# Patient Record
Sex: Male | Born: 1955
Health system: Southern US, Community
[De-identification: ages and names within clinical notes are randomized; demographics above are authoritative.]

## PROBLEM LIST (undated history)

## (undated) DIAGNOSIS — S066X9A Traumatic subarachnoid hemorrhage with loss of consciousness of unspecified duration, initial encounter: Secondary | ICD-10-CM

## (undated) DIAGNOSIS — R55 Syncope and collapse: Secondary | ICD-10-CM

---

## 1979-06-01 HISTORY — PX: PERCUTANEOUS PINNING PHALANX FRACTURE OF HAND: SUR1027

## 2002-01-23 ENCOUNTER — Emergency Department (HOSPITAL_COMMUNITY): Admission: EM | Admit: 2002-01-23 | Discharge: 2002-01-23 | Payer: Self-pay | Admitting: *Deleted

## 2002-01-23 ENCOUNTER — Encounter: Payer: Self-pay | Admitting: *Deleted

## 2002-01-29 ENCOUNTER — Encounter: Admission: RE | Admit: 2002-01-29 | Discharge: 2002-01-29 | Payer: Self-pay | Admitting: Internal Medicine

## 2002-02-02 ENCOUNTER — Encounter: Admission: RE | Admit: 2002-02-02 | Discharge: 2002-02-02 | Payer: Self-pay | Admitting: Internal Medicine

## 2003-10-21 ENCOUNTER — Emergency Department (HOSPITAL_COMMUNITY): Admission: EM | Admit: 2003-10-21 | Discharge: 2003-10-21 | Payer: Self-pay | Admitting: Emergency Medicine

## 2004-11-29 ENCOUNTER — Emergency Department (HOSPITAL_COMMUNITY): Admission: EM | Admit: 2004-11-29 | Discharge: 2004-11-29 | Payer: Self-pay | Admitting: Family Medicine

## 2012-09-18 ENCOUNTER — Observation Stay (HOSPITAL_COMMUNITY)
Admission: EM | Admit: 2012-09-18 | Discharge: 2012-09-20 | Disposition: A | Discharge status: Home / Self Care | Payer: Self-pay | Attending: Internal Medicine | Admitting: Internal Medicine

## 2012-09-18 ENCOUNTER — Observation Stay (HOSPITAL_COMMUNITY): Payer: Self-pay

## 2012-09-18 ENCOUNTER — Encounter (HOSPITAL_COMMUNITY): Payer: Self-pay | Admitting: Emergency Medicine

## 2012-09-18 ENCOUNTER — Emergency Department (HOSPITAL_COMMUNITY): Payer: Self-pay

## 2012-09-18 DIAGNOSIS — S066X9A Traumatic subarachnoid hemorrhage with loss of consciousness of unspecified duration, initial encounter: Secondary | ICD-10-CM

## 2012-09-18 DIAGNOSIS — I609 Nontraumatic subarachnoid hemorrhage, unspecified: Secondary | ICD-10-CM | POA: Diagnosis present

## 2012-09-18 DIAGNOSIS — R112 Nausea with vomiting, unspecified: Secondary | ICD-10-CM | POA: Insufficient documentation

## 2012-09-18 DIAGNOSIS — Z8679 Personal history of other diseases of the circulatory system: Secondary | ICD-10-CM | POA: Diagnosis present

## 2012-09-18 DIAGNOSIS — R55 Syncope and collapse: Principal | ICD-10-CM | POA: Diagnosis present

## 2012-09-18 DIAGNOSIS — Z720 Tobacco use: Secondary | ICD-10-CM | POA: Diagnosis present

## 2012-09-18 DIAGNOSIS — IMO0002 Reserved for concepts with insufficient information to code with codable children: Secondary | ICD-10-CM | POA: Insufficient documentation

## 2012-09-18 DIAGNOSIS — F17200 Nicotine dependence, unspecified, uncomplicated: Secondary | ICD-10-CM | POA: Diagnosis present

## 2012-09-18 DIAGNOSIS — E860 Dehydration: Secondary | ICD-10-CM | POA: Insufficient documentation

## 2012-09-18 DIAGNOSIS — M25511 Pain in right shoulder: Secondary | ICD-10-CM

## 2012-09-18 DIAGNOSIS — W19XXXA Unspecified fall, initial encounter: Secondary | ICD-10-CM | POA: Insufficient documentation

## 2012-09-18 DIAGNOSIS — R197 Diarrhea, unspecified: Secondary | ICD-10-CM | POA: Insufficient documentation

## 2012-09-18 DIAGNOSIS — M25519 Pain in unspecified shoulder: Secondary | ICD-10-CM | POA: Insufficient documentation

## 2012-09-18 HISTORY — DX: Traumatic subarachnoid hemorrhage with loss of consciousness of unspecified duration, initial encounter: S06.6X9A

## 2012-09-18 HISTORY — DX: Syncope and collapse: R55

## 2012-09-18 LAB — POCT I-STAT, CHEM 8
BUN: 7 mg/dL (ref 6–23)
Calcium, Ion: 1.09 mmol/L — ABNORMAL LOW (ref 1.12–1.23)
HCT: 43 % (ref 39.0–52.0)
TCO2: 29 mmol/L (ref 0–100)

## 2012-09-18 LAB — CBC WITH DIFFERENTIAL/PLATELET
Basophils Absolute: 0 10*3/uL (ref 0.0–0.1)
Basophils Relative: 1 % (ref 0–1)
Eosinophils Relative: 0 % (ref 0–5)
Lymphocytes Relative: 18 % (ref 12–46)
MCV: 89.2 fL (ref 78.0–100.0)
Platelets: 181 10*3/uL (ref 150–400)
RDW: 13.7 % (ref 11.5–15.5)
WBC: 4.4 10*3/uL (ref 4.0–10.5)

## 2012-09-18 LAB — TROPONIN I: Troponin I: <0.3 ng/mL (ref <0.30)

## 2012-09-18 MED ORDER — SODIUM CHLORIDE 0.9 % IV SOLN: INTRAVENOUS | Status: AC

## 2012-09-18 MED ORDER — ACETAMINOPHEN 325 MG PO TABS
650.0000 mg | ORAL_TABLET | Freq: Four times a day (QID) | ORAL | Status: DC | PRN
  Administered 2012-09-18 – 2012-09-19 (×4): 650 mg via ORAL
  Filled 2012-09-18 (×3): qty 2
  Filled 2012-09-18: qty 1

## 2012-09-18 MED ORDER — ONDANSETRON HCL 4 MG/2ML IJ SOLN: 4.0000 mg | Freq: Four times a day (QID) | INTRAMUSCULAR | Status: DC | PRN

## 2012-09-18 MED ORDER — SODIUM CHLORIDE 0.9 % IV SOLN
INTRAVENOUS | Status: DC
  Administered 2012-09-18: 16:00:00 via INTRAVENOUS
  Administered 2012-09-19: 1000 mL via INTRAVENOUS
  Administered 2012-09-20: 05:00:00 via INTRAVENOUS

## 2012-09-18 MED ORDER — SODIUM CHLORIDE 0.9 % IV BOLUS (SEPSIS)
1000.0000 mL | Freq: Once | INTRAVENOUS | Status: AC
  Administered 2012-09-18: 1000 mL via INTRAVENOUS

## 2012-09-18 MED ORDER — SODIUM CHLORIDE 0.9 % IV SOLN
INTRAVENOUS | Status: DC
  Administered 2012-09-18: 14:00:00 via INTRAVENOUS

## 2012-09-18 MED ORDER — ONDANSETRON HCL 4 MG PO TABS: 4.0000 mg | ORAL_TABLET | Freq: Four times a day (QID) | ORAL | Status: DC | PRN

## 2012-09-18 MED ORDER — SODIUM CHLORIDE 0.9 % IV SOLN
INTRAVENOUS | Status: DC
  Administered 2012-09-18: 21:00:00 via INTRAVENOUS

## 2012-09-18 MED ORDER — ACETAMINOPHEN 650 MG RE SUPP: 650.0000 mg | Freq: Four times a day (QID) | RECTAL | Status: DC | PRN

## 2012-09-18 MED ORDER — SODIUM CHLORIDE 0.9 % IJ SOLN
3.0000 mL | Freq: Two times a day (BID) | INTRAMUSCULAR | Status: DC
  Administered 2012-09-18: 3 mL via INTRAVENOUS

## 2012-09-18 MED ORDER — ONDANSETRON HCL 4 MG/2ML IJ SOLN: 4.0000 mg | Freq: Three times a day (TID) | INTRAMUSCULAR | Status: AC | PRN

## 2012-09-18 MED ORDER — IOHEXOL 350 MG/ML SOLN
50.0000 mL | Freq: Once | INTRAVENOUS | Status: AC | PRN
  Administered 2012-09-18: 50 mL via INTRAVENOUS

## 2012-09-18 NOTE — ED Notes (Signed)
Pt reports the past couple of days he has been having chills and body aches, but reports he is feeling better today. Pt reports he is unsure what happened this morning, he was standing by his truck then next thing he knew, he was in an ambulance. Pt c/o neck pain and posterior headahce, denies back pain.

## 2012-09-18 NOTE — ED Notes (Signed)
Neurology at bedside to assess patient

## 2012-09-18 NOTE — H&P (Signed)
[facility] History and Physical  Erik Ward RUE:[id number] DOB: 02-Apr-1956 DOA: 09/18/2012  Referring physician: cook PCP: Pcp Not In System  Specialists:   Chief Complaint: syncope  HPI: Erik Ward is a 56 y.o. male with no medical history (but has not seem provider in decades) who presented to Ed 09/18/12 with cc syncope. Information obtained from patient and sister who is at bedside. Pt reports feeling "woozy" for last 6 days. He reports nausea with 1 episode of vomiting 5 days ago and 2 episodes of "explosive" diarrhea 3 days ago. He reports decreased po intake over last 6 days as he has had intermittent nausea during this time. He also endorses subjective fever, chills and dry cough.  He states he was getting out of car this am and as he was closing car door the "woozines" worsened. He denies HA/blurred vision/numbness/tingling. He denies CP palpitation, sob. He does not remember feeling as though he was going to pass out. Sister reports he "fell out right next to car" and struck his head.  EMS arrived and pt remembers being in ambulance. Symptoms came on suddenly, are resolved, characterized as moderate to severe.  In ED he complained of HA in back of head and CT yielded mild amount of subarachnoid hemorrhage over the convexity on the left. Small subdural hygroma left frontal area. This is most likely related to the right parietal head injury.  In addition questionable hyper density between the cerebral hemispheres anteriorly which could be blood or less likely an aneurysm. Follow up head CT is suggested. Pt seen by neurosurgery. [facility] to eval syncope.      Review of Systems: The patient denies weight loss,, vision loss, decreased hearing, hoarseness, chest pain,  peripheral edema, balance deficits, hemoptysis, abdominal pain, melena, hematochezia, severe indigestion/heartburn, hematuria, incontinence, genital sores, muscle weakness, suspicious skin lesions, transient  blindness, difficulty walking, depression, unusual weight change, abnormal bleeding, enlarged lymph nodes, angioedema, and breast masses.    History reviewed. No pertinent past medical history. Past Surgical History  Procedure Date  . Hand surgery    Social History:  reports that he has been smoking Cigarettes.  He has been smoking about .5 packs per day. He has never used smokeless tobacco. He reports that he does not drink alcohol or use illicit drugs. Lives at home and is independent with ADL's   No Known Allergies  History reviewed. No pertinent family history. Mother alive and 38 yo with DM. Father deceased at 30 prostate cancer. 3 siblings medical hx neg for ca, htn, Dm stroke CAD  Prior to Admission medications   Medication Sig Start Date End Date Taking? Authorizing Provider  aspirin-sod bicarb-citric acid (ALKA-SELTZER) 325 MG TBEF Take 325 mg by mouth every 6 (six) hours as needed. For sinus congestion.   Yes Historical Provider, MD  Pseudoephedrine HCl (SINUS DECONGESTANT PO) Take 2 capsules by mouth daily as needed. For head and nasal congestion.   Yes Historical Provider, MD   Physical Exam: Filed Vitals:   09/18/12 1254 09/18/12 1256 09/18/12 1300 09/18/12 1330  BP: 116/73 104/70 114/73 109/67  Pulse: 82 83 84 78  Temp:      TempSrc:      Resp:   22 22  SpO2:   96% 90%     General:  Awake alert well nourished NAD  Eyes: PERRL EOMI no scleral icterus  ENT: ears clear, no nasal discharge, mucus membrane mouth pink/moist  Neck: supple no jvd no lympadenopathy  Cardiovascular: RRR No  MGR No LEE PPP  Respiratory: normal effort BS slightly distant but clear bilaterally. No wheeze/rhonchi  Abdomen: flat soft +BS non-tender to palpation.   Skin: warm/dry no lesions no rashes   Musculoskeletal: MOE no joint swelling erythema. Right shoulder somewhat tender to palpation. Full rom  Psychiatric: appropriate cooperative  Neurologic: oriented x3. Speech clear  facial symmetry. Cranial nerve II-XII intact.   Labs on Admission:  Basic Metabolic Panel:  Lab 09/18/12 1610  NA 138  K 4.5  CL 100  CO2 --  GLUCOSE 121*  BUN 7  CREATININE 1.10  CALCIUM --  MG --  PHOS --   Liver Function Tests: No results found for this basename: AST:5,ALT:5,ALKPHOS:5,BILITOT:5,PROT:5,ALBUMIN:5 in the last 168 hours No results found for this basename: LIPASE:5,AMYLASE:5 in the last 168 hours No results found for this basename: AMMONIA:5 in the last 168 hours CBC:  Lab 09/18/12 1309 09/18/12 1223  WBC 4.4 --  NEUTROABS 2.6 --  HGB 14.6 14.6  HCT 43.0 43.0  MCV 89.2 --  PLT 181 --   Cardiac Enzymes: No results found for this basename: CKTOTAL:5,CKMB:5,CKMBINDEX:5,TROPONINI:5 in the last 168 hours  BNP (last 3 results) No results found for this basename: PROBNP:3 in the last 8760 hours CBG: No results found for this basename: GLUCAP:5 in the last 168 hours  Radiological Exams on Admission: Ct Head Wo Contrast  09/18/2012  *RADIOLOGY REPORT*  Clinical Data: Fall.  Head injury  CT HEAD WITHOUT CONTRAST  Technique:  Contiguous axial images were obtained from the base of the skull through the vertex without contrast.  Comparison: None.  Findings: Several small areas of hyperintensity over the convexity on the left, compatible with mild subarachnoid hemorrhage.  Small subdural hygroma left frontal area may be due to a contrecoup injury.  No high density subdural blood.  There is a right parietal scalp hematoma which is small.  Negative for skull fracture.  Slight hyperintensity is present in the interhemispheric fissure between the frontal lobes anteriorly.  This is near the anterior communicating artery and may be a small amount of blood or could be the anterior communicating artery.  Subarachnoid hemorrhage and aneurysm are considered less likely.  Follow-up CT is suggested.  Ventricle size is normal.  No acute infarct or mass.  There is mild chronic  microvascular ischemia in the white matter.  IMPRESSION: Mild amount of subarachnoid hemorrhage over the convexity on the left.  Small subdural hygroma left frontal area.  This is most likely related to the right parietal head injury.  Questionable hyper density between the cerebral hemispheres anteriorly which could be blood or less likely an aneurysm.  Follow up head CT is suggested.   Original Report Authenticated By: Janeece Riggers, M.D.     EKG: Independently reviewed. NSR  Assessment/Plan Principal Problem:  *Syncope: likely related to decreased po intake of last 6 days in setting of nausea/vomiting/diarrhea over last 3-4 days. Will admit to tele monitor for arrythmia. Will check troponin level x3, will get 2decho as well. Will check Hg A1c and TSH. Repeat EKG in am.   Active Problems:  Subarachnoid hemorrhage: appreciate neurosurgery assistance. Note indicates will be obtaining ct angio to rule out aneurysm. If negative can get follow up CT in am and likely discharge in am.    Orthostasis: mild from sitting to standing.  Likely related to volume depletion from decrease po and diarrhea. Will continue IV fluids. Recheck in am.  Nausea vomiting and diarrhea: no vomiting for 5 days but  remains nauseated. Will start on full liquids and advance as tolerated. Afebrile WC WNL. Non-toxic appearing. Will provide zofran as needed. Will check influenza panel.   Shoulder pain, right: from fall this am. Full rom. Tylenol ice. If no improvement consider xray.   Tobacco abuse: offered cessation counseling.   Dr cram neurosurgery  Code Status: full Family Communication: sister at bedside Disposition Plan: home hopefully in am if neuro clears  Time spent: 55 minutes  [facility] [place] [facility]   If 7PM-7AM, please contact night-coverage [email or web address] Password [facility] 09/18/2012, 2:31 PM

## 2012-09-18 NOTE — ED Notes (Signed)
Attempted to give report to 4N. RN unavailable at this time.

## 2012-09-18 NOTE — H&P (Signed)
Patient seen and examined. Agree with note by Toya Smothers, NP. Patient has been feeling sick with flu-like symptoms including n/v. Today was getting out of the car and became extremely dizzy and lightheaded. Doesn't remember subsequent events, but witnesses report he fell backwards and hit the back of his head. In the ED he was found to have on CT a small subarachnoid hemorrhage. Neurosurgery was contacted and is recommending a repeat CT in am. We have been asked to admit him for syncopal work up. Since he has no focal neurologic findings, do not think MRI is indicated at this point. ECHO/dopplers, monitor on telemetry, ACS rule out. If syncope work up is negative and his repeat CT shows no enlargement of the [facility], he can probably be discharged home in 1-2 days.  Peggye Pitt, MD [facility] Pager: [phone]

## 2012-09-18 NOTE — ED Provider Notes (Signed)
History     CSN: [id number]  Arrival date & time 09/18/12  1110   First MD Initiated Contact with Patient 09/18/12 1116      Chief Complaint  Patient presents with  . Loss of Consciousness    (Consider location/radiation/quality/duration/timing/severity/associated sxs/prior treatment) HPI Comments: Patient presents with complaint of syncopal episode. Patient has had flulike symptoms for the past several days. He has had chills, cough, muscle aches. He reports decreased appetite and he has not been eating and drinking well. Today he was eating and standing along the side of a truck when he felt dizzy and passed out. Bystanders called EMS. Patient states that he struck the back of his head. He denies blurry vision, slurred speech, weakness in his arms or his legs. EMS placed in c-collar. No chest pain, palpitations or history of heart problems. Onset acute. Course is resolved. Nothing makes symptoms better or worse.  The history is provided by the patient.    History reviewed. No pertinent past medical history.  Past Surgical History  Procedure Date  . Hand surgery     History reviewed. No pertinent family history.  History  Substance Use Topics  . Smoking status: Current Every Day Smoker -- 0.5 packs/day    Types: Cigarettes  . Smokeless tobacco: Never Used  . Alcohol Use: No      Review of Systems  Constitutional: Positive for chills and appetite change. Negative for fatigue.  HENT: Negative for sore throat, neck pain and tinnitus.   Eyes: Negative for photophobia, pain and visual disturbance.  Respiratory: Positive for cough. Negative for shortness of breath.   Cardiovascular: Negative for chest pain, palpitations and leg swelling.  Gastrointestinal: Negative for nausea and vomiting.  Musculoskeletal: Positive for myalgias. Negative for back pain and gait problem.  Skin: Positive for wound.  Neurological: Positive for syncope and headaches. Negative for dizziness,  weakness, light-headedness and numbness.  Psychiatric/Behavioral: Negative for confusion and decreased concentration.    Allergies  Review of patient's allergies indicates no known allergies.  Home Medications   Current Outpatient Rx  Name  Route  Sig  Dispense  Refill  . ASPIRIN EFFERVESCENT 325 MG PO TBEF   Oral   Take 325 mg by mouth every 6 (six) hours as needed. For sinus congestion.         Marland Kitchen SINUS DECONGESTANT PO   Oral   Take 2 capsules by mouth daily as needed. For head and nasal congestion.           BP 113/78  Temp 98.4 F (36.9 C) (Oral)  Resp 17  SpO2 96%  Physical Exam  Nursing note and vitals reviewed. Constitutional: He is oriented to person, place, and time. He appears well-developed and well-nourished.  HENT:  Head: Normocephalic. Head is without raccoon's eyes and without Battle's sign.  Right Ear: Tympanic membrane, external ear and ear canal normal. No hemotympanum.  Left Ear: Tympanic membrane, external ear and ear canal normal. No hemotympanum.  Nose: Nose normal. No nasal septal hematoma.  Mouth/Throat: Oropharynx is clear and moist. Mucous membranes are dry.       Small abrasion to right occiput, no laceration.  Eyes: Conjunctivae normal, EOM and lids are normal. Pupils are equal, round, and reactive to light.       No visible hyphema  Neck: Normal range of motion. Neck supple.  Cardiovascular: Normal rate and regular rhythm.   Pulmonary/Chest: Effort normal and breath sounds normal.  Abdominal: Soft. There is no tenderness.  Musculoskeletal: Normal range of motion.       Cervical back: He exhibits normal range of motion, no tenderness and no bony tenderness.       Thoracic back: He exhibits no tenderness and no bony tenderness.       Lumbar back: He exhibits no tenderness and no bony tenderness.  Neurological: He is alert and oriented to person, place, and time. He has normal strength and normal reflexes. No cranial nerve deficit or sensory  deficit. Coordination normal. GCS eye subscore is 4. GCS verbal subscore is 5. GCS motor subscore is 6.  Skin: Skin is warm and dry.  Psychiatric: He has a normal mood and affect.    ED Course  Procedures (including critical care time)  Labs Reviewed  POCT I-STAT, CHEM 8 - Abnormal; Notable for the following:    Glucose, Bld 121 (*)     Calcium, Ion 1.09 (*)     All other components within normal limits  CBC WITH DIFFERENTIAL - Abnormal; Notable for the following:    Monocytes Relative 22 (*)     All other components within normal limits   Ct Head Wo Contrast  09/18/2012  *RADIOLOGY REPORT*  Clinical Data: Fall.  Head injury  CT HEAD WITHOUT CONTRAST  Technique:  Contiguous axial images were obtained from the base of the skull through the vertex without contrast.  Comparison: None.  Findings: Several small areas of hyperintensity over the convexity on the left, compatible with mild subarachnoid hemorrhage.  Small subdural hygroma left frontal area may be due to a contrecoup injury.  No high density subdural blood.  There is a right parietal scalp hematoma which is small.  Negative for skull fracture.  Slight hyperintensity is present in the interhemispheric fissure between the frontal lobes anteriorly.  This is near the anterior communicating artery and may be a small amount of blood or could be the anterior communicating artery.  Subarachnoid hemorrhage and aneurysm are considered less likely.  Follow-up CT is suggested.  Ventricle size is normal.  No acute infarct or mass.  There is mild chronic microvascular ischemia in the white matter.  IMPRESSION: Mild amount of subarachnoid hemorrhage over the convexity on the left.  Small subdural hygroma left frontal area.  This is most likely related to the right parietal head injury.  Questionable hyper density between the cerebral hemispheres anteriorly which could be blood or less likely an aneurysm.  Follow up head CT is suggested.   Original Report  Authenticated By: Janeece Riggers, M.D.      1. Subarachnoid hemorrhage   2. Syncope   3. Dehydration     11:35 AM Patient seen and examined. Work-up initiated. Medications ordered. Neck cleared by NEXUS. Patient with full ROM in neck without pain.   Vital signs reviewed and are as follows: Filed Vitals:   09/18/12 1118  BP: 113/78  Temp: 98.4 F (36.9 C)  Resp: 17   11:44 AM Dr. Adriana Simas has seen. Head CT ordered.    Date: 09/18/2012  Rate: 80  Rhythm: normal sinus rhythm  QRS Axis: left  Intervals: normal  ST/T Wave abnormalities: normal  Conduction Disutrbances:none  Narrative Interpretation:   Old EKG Reviewed: none available  1:38 PM Spoke with Dr. Lucila Maine. Advises repeat head CT in morning. If not worsening, can be discharged. Will admit to medicine.   Spoke with Dr. Ardyth Harps who will admit.    MDM  Admit for monitoring of subarachnoid hemorrhages. Suspect fall 2/2 dehydration after recent  ILI.         Renne Crigler, PA 09/18/12 1529

## 2012-09-18 NOTE — ED Notes (Signed)
Per EMS - pt had syncopal episode today while he was outside walking. Pt reports he hit his head at first denied neck and back pain then started to have some neck pain. EMS placed C-collar on pt and started an 18G IV. Pt reports the past few days he has been having flu-like symptoms. BP 122/67 HR 82  RR 16

## 2012-09-18 NOTE — Consult Note (Signed)
Reason for Consult: Closed head injury subarachnoid hemorrhage Referring Physician: Emergency department  Erik Ward is an 56 y.o. male.  HPI: Patient is a 56 year old 7 who had a syncopal episode earlier today striking the back of his head on the street today and is brought to the emergency room currently is complaining of a headache in the back of his head he denies any vision changes numbness tingling nausea vomiting history does report that he had when he is described as flulike symptoms in the last couple days with some dizziness headaches and general malaise. CT scan the ER revealed some what appears to be traumatic subarachnoid hemorrhage and we have been consult.  History reviewed. No pertinent past medical history.  Past Surgical History  Procedure Date  . Hand surgery     History reviewed. No pertinent family history.  Social History:  reports that he has been smoking Cigarettes.  He has been smoking about .5 packs per day. He has never used smokeless tobacco. He reports that he does not drink alcohol or use illicit drugs.  Allergies: No Known Allergies  Medications:   Results for orders placed during the hospital encounter of 09/18/12 (from the past 48 hour(s))  POCT I-STAT, CHEM 8     Status: Abnormal   Collection Time   09/18/12 12:23 PM      Component Value Range Comment   Sodium 138  135 - 145 mEq/L    Potassium 4.5  3.5 - 5.1 mEq/L    Chloride 100  96 - 112 mEq/L    BUN 7  6 - 23 mg/dL    Creatinine, Ser 1.61  0.50 - 1.35 mg/dL    Glucose, Bld 096 (*) 70 - 99 mg/dL    Calcium, Ion 0.45 (*) 1.12 - 1.23 mmol/L    TCO2 29  0 - 100 mmol/L    Hemoglobin 14.6  13.0 - 17.0 g/dL    HCT 40.9  81.1 - 91.4 %   CBC WITH DIFFERENTIAL     Status: Abnormal   Collection Time   09/18/12  1:09 PM      Component Value Range Comment   WBC 4.4  4.0 - 10.5 K/uL    RBC 4.82  4.22 - 5.81 MIL/uL    Hemoglobin 14.6  13.0 - 17.0 g/dL    HCT 78.2  95.6 - 21.3 %    MCV 89.2   78.0 - 100.0 fL    MCH 30.3  26.0 - 34.0 pg    MCHC 34.0  30.0 - 36.0 g/dL    RDW 08.6  57.8 - 46.9 %    Platelets 181  150 - 400 K/uL    Neutrophils Relative 59  43 - 77 %    Neutro Abs 2.6  1.7 - 7.7 K/uL    Lymphocytes Relative 18  12 - 46 %    Lymphs Abs 0.8  0.7 - 4.0 K/uL    Monocytes Relative 22 (*) 3 - 12 %    Monocytes Absolute 1.0  0.1 - 1.0 K/uL    Eosinophils Relative 0  0 - 5 %    Eosinophils Absolute 0.0  0.0 - 0.7 K/uL    Basophils Relative 1  0 - 1 %    Basophils Absolute 0.0  0.0 - 0.1 K/uL     Ct Head Wo Contrast  09/18/2012  *RADIOLOGY REPORT*  Clinical Data: Fall.  Head injury  CT HEAD WITHOUT CONTRAST  Technique:  Contiguous axial images were  obtained from the base of the skull through the vertex without contrast.  Comparison: None.  Findings: Several small areas of hyperintensity over the convexity on the left, compatible with mild subarachnoid hemorrhage.  Small subdural hygroma left frontal area may be due to a contrecoup injury.  No high density subdural blood.  There is a right parietal scalp hematoma which is small.  Negative for skull fracture.  Slight hyperintensity is present in the interhemispheric fissure between the frontal lobes anteriorly.  This is near the anterior communicating artery and may be a small amount of blood or could be the anterior communicating artery.  Subarachnoid hemorrhage and aneurysm are considered less likely.  Follow-up CT is suggested.  Ventricle size is normal.  No acute infarct or mass.  There is mild chronic microvascular ischemia in the white matter.  IMPRESSION: Mild amount of subarachnoid hemorrhage over the convexity on the left.  Small subdural hygroma left frontal area.  This is most likely related to the right parietal head injury.  Questionable hyper density between the cerebral hemispheres anteriorly which could be blood or less likely an aneurysm.  Follow up head CT is suggested.   Original Report Authenticated By: Janeece Riggers, M.D.     @ROS @ Blood pressure 109/67, pulse 78, temperature 98.4 F (36.9 C), temperature source Oral, resp. rate 22, SpO2 90.00%. Awake alert oriented x4 pupils are equal round and reactive to light extraocular movements are intact and cranial nerves are intact discs were unable to visualize strength is 5 out of 5 in his upper and lower extremities there is no pronator drift reflexes are normal and symmetric and nonpathologic I examined his right parietal occipital area at the site of the impact I see no laceration.  Assessment/Plan: 56 year old some is a syncopal episode of fall with a CAT scan shows a small amount of subarachnoid hemorrhage. More than likely this represents dramatic subarachnoid hemorrhage from the fall however the syncopal episode I think is poorly defined and understood so I think we have to work this up it is possible his flulike symptoms may have been referable to a small Ascent no bleeding from an aneurysm. However I do think that I requested the ER to call the internal medicine doctors to put into a syncopal workup and evaluation I. will be obtaining a CT angiogram to rule out aneurysm if this is negative then I think also patient should need is a follow up head CT either tonight or first thing in the morning and then discharge per medicine.  Cornelius Schuitema P 09/18/2012, 1:56 PM

## 2012-09-18 NOTE — Progress Notes (Signed)
Patient ID: Erik Ward, male   DOB: March 08, 1956, 56 y.o.   MRN: [id number] CT angiogram negative recommend followup head CT in the morning and facet stably discharged from a neurosurgical standpoint

## 2012-09-18 NOTE — ED Notes (Signed)
Pt returned from xray placed back on monitor 

## 2012-09-19 ENCOUNTER — Observation Stay (HOSPITAL_COMMUNITY): Payer: Self-pay

## 2012-09-19 DIAGNOSIS — R55 Syncope and collapse: Secondary | ICD-10-CM

## 2012-09-19 DIAGNOSIS — M25519 Pain in unspecified shoulder: Secondary | ICD-10-CM

## 2012-09-19 LAB — INFLUENZA PANEL BY PCR (TYPE A & B)
H1N1 flu by pcr: DETECTED — AB
Influenza B By PCR: NEGATIVE

## 2012-09-19 LAB — BASIC METABOLIC PANEL
CO2: 26 mEq/L (ref 19–32)
Calcium: 8.7 mg/dL (ref 8.4–10.5)
Chloride: 104 mEq/L (ref 96–112)
Creatinine, Ser: 1.05 mg/dL (ref 0.50–1.35)
GFR calc Af Amer: 89 mL/min — ABNORMAL LOW (ref >90)
Sodium: 137 mEq/L (ref 135–145)

## 2012-09-19 LAB — TSH: TSH: 0.142 u[IU]/mL — ABNORMAL LOW (ref 0.350–4.500)

## 2012-09-19 LAB — TROPONIN I: Troponin I: <0.3 ng/mL (ref <0.30)

## 2012-09-19 LAB — CBC
Platelets: 177 10*3/uL (ref 150–400)
RBC: 4.43 MIL/uL (ref 4.22–5.81)
RDW: 13.9 % (ref 11.5–15.5)
WBC: 2.9 10*3/uL — ABNORMAL LOW (ref 4.0–10.5)

## 2012-09-19 LAB — HEMOGLOBIN A1C: Hgb A1c MFr Bld: 6.3 % — ABNORMAL HIGH (ref <5.7)

## 2012-09-19 NOTE — Progress Notes (Signed)
Patient admitted with syncope by the medicine service. To be discharged today. Medicine service has requested outpatient event monitor for further syncope evaluation. This will be arranged for Monday when the office is open. The patient will be called with the time, and special instructions will be given at that time.   Jacqulyn Bath, PA-C 09/19/2012 11:55 AM

## 2012-09-19 NOTE — Progress Notes (Signed)
*  PRELIMINARY RESULTS* Vascular Ultrasound Carotid Duplex (Doppler) has been completed.  Preliminary findings: Bilateral:  No evidence of hemodynamically significant internal carotid artery stenosis.   Vertebral artery flow is antegrade.      Farrel Demark, RDMS, RVT 09/19/2012, 10:54 AM

## 2012-09-19 NOTE — Progress Notes (Signed)
  Echocardiogram 2D Echocardiogram has been performed.  [place]ian Co 09/19/2012, 3:50 PM

## 2012-09-19 NOTE — Progress Notes (Signed)
[facility]             Progress Note   Subjective: Complains of right shoulder pain.  Objective: Vital signs in last 24 hours: Temp:  [98.7 F (37.1 C)-100.5 F (38.1 C)] 98.8 F (37.1 C) (12/21 0600) Pulse Rate:  [66-100] 66  (12/21 0600) Resp:  [18-25] 18  (12/21 0600) BP: (101-127)/(67-84) 127/73 mmHg (12/21 0600) SpO2:  [90 %-98 %] 96 % (12/21 0600) Weight change:     Intake/Output from previous day:       Physical Exam: General: Alert, awake, oriented x3, in no acute distress. HEENT: No bruits, no goiter. Heart: Regular rate and rhythm, without murmurs, rubs, gallops. Lungs: Clear to auscultation bilaterally. Abdomen: Soft, nontender, nondistended, positive bowel sounds. Extremities: No clubbing cyanosis or edema with positive pedal pulses. Neuro: Grossly intact, nonfocal.    Lab Results: Basic Metabolic Panel:  Basename 09/19/12 0222 09/18/12 1223  NA 137 138  K 4.6 4.5  CL 104 100  CO2 26 --  GLUCOSE 106* 121*  BUN 5* 7  CREATININE 1.05 1.10  CALCIUM 8.7 --  MG -- --  PHOS -- --   CBC:  Basename 09/19/12 0222 09/18/12 1309  WBC 2.9* 4.4  NEUTROABS -- 2.6  HGB 12.8* 14.6  HCT 39.8 43.0  MCV 89.8 89.2  PLT 177 181   Cardiac Enzymes:  Basename 09/19/12 0221 09/18/12 2038 09/18/12 1533  CKTOTAL -- -- --  CKMB -- -- --  CKMBINDEX -- -- --  TROPONINI <0.30 <0.30 <0.30   Hemoglobin A1C:  Basename 09/18/12 1533  HGBA1C 6.3*   Thyroid Function Tests:  Basename 09/18/12 1533  TSH 0.142*  T4TOTAL --  FREET4 --  T3FREE --  THYROIDAB --    Studies/Results: Ct Angio Head W/cm &/or Wo Cm  09/18/2012  *RADIOLOGY REPORT*  Clinical Data:  56 year old male with syncope, fall, small volume subarachnoid hemorrhage.  CT ANGIOGRAPHY HEAD  Technique:  Multidetector CT imaging of the head was performed using the standard protocol during bolus administration of intravenous contrast.  Multiplanar CT image reconstructions including  MIPs were obtained to evaluate the vascular anatomy.  Contrast: 50mL OMNIPAQUE IOHEXOL 350 MG/ML SOLN  Comparison:  Head CT without contrast [facility] 1224 hours same day.  Findings:  Stable small volume of subarachnoid hemorrhage primarily seen over the left superior convexity, probably also in the anterior interhemispheric fissure.  Small/trace left subdural hygroma or hematoma also suspected.  No significant midline shift. Stable gray-white matter differentiation throughout the brain.  No evidence of cortically based acute infarction identified.  No abnormal enhancement identified.  No cerebral contusions are identified. Stable visualized osseous structures.  Stable paranasal sinuses.  Vascular Findings: Codominant distal vertebral arteries appear normal.  Bilateral PICA flow.  Normal vertebrobasilar junction. Normal basilar artery.  SCA and PCA origins are normal.  Posterior communicating arteries are diminutive or absent.  Bilateral PCA branches are within normal limits.  Normal distal cervical ICA.  Patent ICA siphons without stenosis. Normal ophthalmic artery origins.  Probable small infundibulum at the left anterior choroidal artery origin (sagittal image 87).  No ICA stenosis.  Normal carotid termini.  Normal MCA and ACA origins. Diminutive anterior communicating artery.  Bilateral ACA branches are within normal limits.  Bilateral M1 segments are normal. Bilateral MCA branches are within normal limits.   Review of the MIP images confirms the above findings.  IMPRESSION: 1.  Negative intracranial CTA.  No intracranial aneurysm identified. 2.  Stable small volume subarachnoid hemorrhage.  Suspect trace left subdural hematoma or hygroma.  No significant midline shift. 3.  No parenchymal contusion or new intracranial abnormality identified.   Original Report Authenticated By: Erskine Speed, M.D.    Ct Head Wo Contrast  09/19/2012  *RADIOLOGY REPORT*  Clinical Data: Subarachnoid hemorrhage.  CT HEAD WITHOUT CONTRAST   Technique:  Contiguous axial images were obtained from the base of the skull through the vertex without contrast.  Comparison: CT head without contrast 09/18/2012.  CTA head 09/18/2012.  Findings: Slight prominence of the extraaxial spaces bilaterally is stable.  Hyperdensity over the left frontal convexity is compatible with vascular structures as demonstrated on the CTA.  Minimal foci of subarachnoid hemorrhage scattered over the high left frontal convexity is near completely resolved.  No new hemorrhage is present.  There is no focal mass effect or midline shift.  The ventricles are of normal size.  No acute cortical infarct is present.  Mild mucosal thickening is present in the ethmoid air cells.  The paranasal sinuses and mastoid air cells are otherwise clear.  There is pneumatization of the petrous apex bilaterally, a normal variant.  IMPRESSION:  1.  Resolving foci of subarachnoid blood over the left frontal convexity. 2.  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  Hyperdensities within these collections are compatible with traversing vessels.   Original Report Authenticated By: Marin Roberts, M.D.    Ct Head Wo Contrast  09/18/2012  *RADIOLOGY REPORT*  Clinical Data: Fall.  Head injury  CT HEAD WITHOUT CONTRAST  Technique:  Contiguous axial images were obtained from the base of the skull through the vertex without contrast.  Comparison: None.  Findings: Several small areas of hyperintensity over the convexity on the left, compatible with mild subarachnoid hemorrhage.  Small subdural hygroma left frontal area may be due to a contrecoup injury.  No high density subdural blood.  There is a right parietal scalp hematoma which is small.  Negative for skull fracture.  Slight hyperintensity is present in the interhemispheric fissure between the frontal lobes anteriorly.  This is near the  anterior communicating artery and may be a small amount of blood or could be the anterior communicating artery.  Subarachnoid hemorrhage and aneurysm are considered less likely.  Follow-up CT is suggested.  Ventricle size is normal.  No acute infarct or mass.  There is mild chronic microvascular ischemia in the white matter.  IMPRESSION: Mild amount of subarachnoid hemorrhage over the convexity on the left.  Small subdural hygroma left frontal area.  This is most likely related to the right parietal head injury.  Questionable hyper density between the cerebral hemispheres anteriorly which could be blood or less likely an aneurysm.  Follow up head CT is suggested.   Original Report Authenticated By: Janeece Riggers, M.D.     Medications: Scheduled Meds:   . sodium chloride   Intravenous STAT  . sodium chloride  3 mL Intravenous Q12H   Continuous Infusions:   . sodium chloride 125 mL/hr at 09/18/12 2114  . sodium chloride 100 mL/hr at 09/18/12 1533   PRN Meds:.acetaminophen, acetaminophen, ondansetron (ZOFRAN) IV, ondansetron  Assessment/Plan:  Principal Problem:  *Syncope Active Problems:  Subarachnoid hemorrhage  Orthostasis  Nausea vomiting and diarrhea  Shoulder pain, right  Tobacco abuse   Syncope -Await results of ECHO/dopplers. -No events on tele so far. -Have asked [facility] to arrange for a 30 day event monitor.  Subarachnoid Hemorrhage -  Following fall from syncope. -Repeat CT Head shows almost resolution. -Per neurosurgery, no further work up.  Right Shoulder Pain -Following fall. -Will XRay. -PRN pain meds.   Time spent coordinating care: 35 minutes.   LOS: 1 day   [facility] Pager: [phone] 09/19/2012, 11:52 AM

## 2012-09-19 NOTE — Progress Notes (Signed)
Patient ID: Erik Ward, male   DOB: [birth year], 56 y.o.   MRN: [id number] Head ct reviewed. Stable, and not worrisome. Ok for [employer] from out standpoint.

## 2012-09-20 NOTE — Progress Notes (Signed)
Discharge orders received.  Removed pt IV and telemetry. Pt education completed.  Pt waiting for his ride to get here.  Will discharge to home around 1 pm.

## 2012-09-20 NOTE — Discharge Summary (Signed)
Physician Discharge Summary  Patient ID: Erik Ward MRN: [id number] DOB/AGE: 57-25-1957 57 y.o.  Admit date: 09/18/2012 Discharge date: 09/20/2012  Primary Care Physician:  Pcp Not In System   Discharge Diagnoses:    Principal Problem:  *Syncope Active Problems:  Subarachnoid hemorrhage  Nausea vomiting and diarrhea  Shoulder pain, right  Tobacco abuse  Right shoulder pain      Medication List     As of 09/20/2012 11:42 AM    STOP taking these medications         SINUS DECONGESTANT PO      TAKE these medications         ALKA-SELTZER 325 MG Tbef   Generic drug: aspirin-sod bicarb-citric acid   Take 325 mg by mouth every 6 (six) hours as needed. For sinus congestion.         Disposition and Follow-up:  Will be discharged home today in stable and improved condition. Has been advised to follow up with his PCP in 2 weeks. Also [facility] will call him on Monday for placement of an event monitor.  Consults:  Neurosurgery, Dr. Wynetta Emery.   Significant Diagnostic Studies:  Ct Angio Head W/cm &/or Wo Cm  09/18/2012  *RADIOLOGY REPORT*  Clinical Data:  56 year old male with syncope, fall, small volume subarachnoid hemorrhage.  CT ANGIOGRAPHY HEAD  Technique:  Multidetector CT imaging of the head was performed using the standard protocol during bolus administration of intravenous contrast.  Multiplanar CT image reconstructions including MIPs were obtained to evaluate the vascular anatomy.  Contrast: 50mL OMNIPAQUE IOHEXOL 350 MG/ML SOLN  Comparison:  Head CT without contrast [facility] 1224 hours same day.  Findings:  Stable small volume of subarachnoid hemorrhage primarily seen over the left superior convexity, probably also in the anterior interhemispheric fissure.  Small/trace left subdural hygroma or hematoma also suspected.  No significant midline shift. Stable gray-white matter differentiation throughout the brain.  No evidence of cortically based acute infarction identified.   No abnormal enhancement identified.  No cerebral contusions are identified. Stable visualized osseous structures.  Stable paranasal sinuses.  Vascular Findings: Codominant distal vertebral arteries appear normal.  Bilateral PICA flow.  Normal vertebrobasilar junction. Normal basilar artery.  SCA and PCA origins are normal.  Posterior communicating arteries are diminutive or absent.  Bilateral PCA branches are within normal limits.  Normal distal cervical ICA.  Patent ICA siphons without stenosis. Normal ophthalmic artery origins.  Probable small infundibulum at the left anterior choroidal artery origin (sagittal image 87).  No ICA stenosis.  Normal carotid termini.  Normal MCA and ACA origins. Diminutive anterior communicating artery.  Bilateral ACA branches are within normal limits.  Bilateral M1 segments are normal. Bilateral MCA branches are within normal limits.   Review of the MIP images confirms the above findings.  IMPRESSION: 1.  Negative intracranial CTA.  No intracranial aneurysm identified. 2.  Stable small volume subarachnoid hemorrhage.  Suspect trace left subdural hematoma or hygroma.  No significant midline shift. 3.  No parenchymal contusion or new intracranial abnormality identified.   Original Report Authenticated By: Erskine Speed, M.D.    Dg Shoulder Right  09/19/2012  *RADIOLOGY REPORT*  Clinical Data: Right shoulder pain following fall  RIGHT SHOULDER - 2+ VIEW  Comparison: None.  Findings: No acute fracture or dislocation is identified.  No soft tissue abnormality is seen.  The underlying bony thorax is unremarkable.  IMPRESSION: No acute abnormality noted.   Original Report Authenticated By: Alcide Clever, M.D.    Ct  Head Wo Contrast  09/19/2012  *RADIOLOGY REPORT*  Clinical Data: Subarachnoid hemorrhage.  CT HEAD WITHOUT CONTRAST  Technique:  Contiguous axial images were obtained from the base of the skull through the vertex without contrast.  Comparison: CT head without contrast  09/18/2012.  CTA head 09/18/2012.  Findings: Slight prominence of the extraaxial spaces bilaterally is stable.  Hyperdensity over the left frontal convexity is compatible with vascular structures as demonstrated on the CTA.  Minimal foci of subarachnoid hemorrhage scattered over the high left frontal convexity is near completely resolved.  No new hemorrhage is present.  There is no focal mass effect or midline shift.  The ventricles are of normal size.  No acute cortical infarct is present.  Mild mucosal thickening is present in the ethmoid air cells.  The paranasal sinuses and mastoid air cells are otherwise clear.  There is pneumatization of the petrous apex bilaterally, a normal variant.  IMPRESSION:  1.  Resolving foci of subarachnoid blood over the left frontal convexity. 2.  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  Hyperdensities within these collections are compatible with traversing vessels.   Original Report Authenticated By: Marin Roberts, M.D.    Ct Head Wo Contrast  09/18/2012  *RADIOLOGY REPORT*  Clinical Data: Fall.  Head injury  CT HEAD WITHOUT CONTRAST  Technique:  Contiguous axial images were obtained from the base of the skull through the vertex without contrast.  Comparison: None.  Findings: Several small areas of hyperintensity over the convexity on the left, compatible with mild subarachnoid hemorrhage.  Small subdural hygroma left frontal area may be due to a contrecoup injury.  No high density subdural blood.  There is a right parietal scalp hematoma which is small.  Negative for skull fracture.  Slight hyperintensity is present in the interhemispheric fissure between the frontal lobes anteriorly.  This is near the anterior communicating artery and may be a small amount of blood or could be the anterior communicating artery.  Subarachnoid hemorrhage and aneurysm are  considered less likely.  Follow-up CT is suggested.  Ventricle size is normal.  No acute infarct or mass.  There is mild chronic microvascular ischemia in the white matter.  IMPRESSION: Mild amount of subarachnoid hemorrhage over the convexity on the left.  Small subdural hygroma left frontal area.  This is most likely related to the right parietal head injury.  Questionable hyper density between the cerebral hemispheres anteriorly which could be blood or less likely an aneurysm.  Follow up head CT is suggested.   Original Report Authenticated By: Janeece Riggers, M.D.     Brief H and P: For complete details please refer to admission H and P, but in brief patient is a 56 y.o. male with no medical history (but has not seem provider in decades) who presented to Ed 09/18/12 with cc syncope. Information obtained from patient and sister who is at bedside. Pt reports feeling "woozy" for last 6 days. He reports nausea with 1 episode of vomiting 5 days ago and 2 episodes of "explosive" diarrhea 3 days ago. He reports decreased po intake over last 6 days as he has had intermittent nausea during this time. He also endorses subjective fever, chills and dry cough. He states he was getting out of car this am and as he was closing car door the "woozines" worsened. He denies HA/blurred vision/numbness/tingling. He denies CP palpitation, sob. He does not remember feeling as though  he was going to pass out. Sister reports he "fell out right next to car" and struck his head. EMS arrived and pt remembers being in ambulance. Symptoms came on suddenly, are resolved, characterized as moderate to severe. In ED he complained of HA in back of head and CT yielded mild amount of subarachnoid hemorrhage over the convexity on the left. Small subdural hygroma left frontal area. This is most likely related to the right parietal head injury. In addition questionable hyper density between the cerebral hemispheres anteriorly which could be blood or  less likely an aneurysm. Follow up head CT is suggested. Pt seen by neurosurgery. [facility] to eval syncope.      Hospital Course:  Principal Problem:  *Syncope Active Problems:  Subarachnoid hemorrhage  Nausea vomiting and diarrhea  Shoulder pain, right  Tobacco abuse  Right shoulder pain   Syncope -Could have been related to mild dehydration given GI losses, altho he was not orthostatic on admission. -His history makes me suspicious for cardiac syncope. -He has not had any events on tele here. -Have asked [facility] for assistance with placement of an event monitor; they will call him to set this up.  Subarachnoid Hemorrhage -Following head trauma with fall after syncope. -Repeat CT shows almost resolution. -Has been cleared for DC from neurosurgery from this standpoint.   Time spent on Discharge: Greater than 30 minutes.  SignedChaya Jan [facility] Pager: [phone] 09/20/2012, 11:42 AM

## 2012-09-20 NOTE — Progress Notes (Signed)
Subjective: Patient reports slight headache  Objective: Vital signs in last 24 hours: Temp:  [98.3 F (36.8 C)-98.5 F (36.9 C)] 98.5 F (36.9 C) (12/22 0550) Pulse Rate:  [67-73] 73  (12/22 0550) Resp:  [18-20] 18  (12/22 0550) BP: (121-130)/(71-81) 130/81 mmHg (12/22 0550) SpO2:  [96 %-98 %] 98 % (12/22 0550) Weight:  [72.802 kg (160 lb 8 oz)] 72.802 kg (160 lb 8 oz) (12/21 1202)  Intake/Output from previous day: 12/21 0701 - 12/22 0700 In: 3645 [I.V.:3645] Out: 400 [Urine:400] Intake/Output this shift:    Physical Exam: Stable, nonfocal  Lab Results:  Basename 09/19/12 0222 09/18/12 1309  WBC 2.9* 4.4  HGB 12.8* 14.6  HCT 39.8 43.0  PLT 177 181   BMET  Basename 09/19/12 0222 09/18/12 1223  NA 137 138  K 4.6 4.5  CL 104 100  CO2 26 --  GLUCOSE 106* 121*  BUN 5* 7  CREATININE 1.05 1.10  CALCIUM 8.7 --    Studies/Results: Ct Angio Head W/cm &/or Wo Cm  09/18/2012  *RADIOLOGY REPORT*  Clinical Data:  56 year old male with syncope, fall, small volume subarachnoid hemorrhage.  CT ANGIOGRAPHY HEAD  Technique:  Multidetector CT imaging of the head was performed using the standard protocol during bolus administration of intravenous contrast.  Multiplanar CT image reconstructions including MIPs were obtained to evaluate the vascular anatomy.  Contrast: 50mL OMNIPAQUE IOHEXOL 350 MG/ML SOLN  Comparison:  Head CT without contrast [facility] 1224 hours same day.  Findings:  Stable small volume of subarachnoid hemorrhage primarily seen over the left superior convexity, probably also in the anterior interhemispheric fissure.  Small/trace left subdural hygroma or hematoma also suspected.  No significant midline shift. Stable gray-white matter differentiation throughout the brain.  No evidence of cortically based acute infarction identified.  No abnormal enhancement identified.  No cerebral contusions are identified. Stable visualized osseous structures.  Stable paranasal sinuses.   Vascular Findings: Codominant distal vertebral arteries appear normal.  Bilateral PICA flow.  Normal vertebrobasilar junction. Normal basilar artery.  SCA and PCA origins are normal.  Posterior communicating arteries are diminutive or absent.  Bilateral PCA branches are within normal limits.  Normal distal cervical ICA.  Patent ICA siphons without stenosis. Normal ophthalmic artery origins.  Probable small infundibulum at the left anterior choroidal artery origin (sagittal image 87).  No ICA stenosis.  Normal carotid termini.  Normal MCA and ACA origins. Diminutive anterior communicating artery.  Bilateral ACA branches are within normal limits.  Bilateral M1 segments are normal. Bilateral MCA branches are within normal limits.   Review of the MIP images confirms the above findings.  IMPRESSION: 1.  Negative intracranial CTA.  No intracranial aneurysm identified. 2.  Stable small volume subarachnoid hemorrhage.  Suspect trace left subdural hematoma or hygroma.  No significant midline shift. 3.  No parenchymal contusion or new intracranial abnormality identified.   Original Report Authenticated By: Erskine Speed, M.D.    Dg Shoulder Right  09/19/2012  *RADIOLOGY REPORT*  Clinical Data: Right shoulder pain following fall  RIGHT SHOULDER - 2+ VIEW  Comparison: None.  Findings: No acute fracture or dislocation is identified.  No soft tissue abnormality is seen.  The underlying bony thorax is unremarkable.  IMPRESSION: No acute abnormality noted.   Original Report Authenticated By: Alcide Clever, M.D.    Ct Head Wo Contrast  09/19/2012  *RADIOLOGY REPORT*  Clinical Data: Subarachnoid hemorrhage.  CT HEAD WITHOUT CONTRAST  Technique:  Contiguous axial images were obtained from the base  of the skull through the vertex without contrast.  Comparison: CT head without contrast 09/18/2012.  CTA head 09/18/2012.  Findings: Slight prominence of the extraaxial spaces bilaterally is stable.  Hyperdensity over the left frontal  convexity is compatible with vascular structures as demonstrated on the CTA.  Minimal foci of subarachnoid hemorrhage scattered over the high left frontal convexity is near completely resolved.  No new hemorrhage is present.  There is no focal mass effect or midline shift.  The ventricles are of normal size.  No acute cortical infarct is present.  Mild mucosal thickening is present in the ethmoid air cells.  The paranasal sinuses and mastoid air cells are otherwise clear.  There is pneumatization of the petrous apex bilaterally, a normal variant.  IMPRESSION:  1.  Resolving foci of subarachnoid blood over the left frontal convexity. 2.  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  Hyperdensities within these collections are compatible with traversing vessels.   Original Report Authenticated By: Marin Roberts, M.D.    Ct Head Wo Contrast  09/18/2012  *RADIOLOGY REPORT*  Clinical Data: Fall.  Head injury  CT HEAD WITHOUT CONTRAST  Technique:  Contiguous axial images were obtained from the base of the skull through the vertex without contrast.  Comparison: None.  Findings: Several small areas of hyperintensity over the convexity on the left, compatible with mild subarachnoid hemorrhage.  Small subdural hygroma left frontal area may be due to a contrecoup injury.  No high density subdural blood.  There is a right parietal scalp hematoma which is small.  Negative for skull fracture.  Slight hyperintensity is present in the interhemispheric fissure between the frontal lobes anteriorly.  This is near the anterior communicating artery and may be a small amount of blood or could be the anterior communicating artery.  Subarachnoid hemorrhage and aneurysm are considered less likely.  Follow-up CT is suggested.  Ventricle size is normal.  No acute infarct or mass.  There is mild chronic microvascular ischemia  in the white matter.  IMPRESSION: Mild amount of subarachnoid hemorrhage over the convexity on the left.  Small subdural hygroma left frontal area.  This is most likely related to the right parietal head injury.  Questionable hyper density between the cerebral hemispheres anteriorly which could be blood or less likely an aneurysm.  Follow up head CT is suggested.   Original Report Authenticated By: Janeece Riggers, M.D.     Assessment/Plan: OK from Neurosurgical standpoint.  Syncopal workup under way.  We will sign off at this point.  Call if concerns or questions.    LOS: 2 days    Dorian Heckle, MD 09/20/2012, 7:15 AM

## 2012-09-21 NOTE — Care Management Note (Signed)
    Page 1 of 1   09/21/2012     8:19:18 AM   CARE MANAGEMENT NOTE 09/21/2012  Patient:  Erik Ward,Erik Ward   Account Number:  [id number]  Date Initiated:  09/18/2012  Documentation initiated by:  [facility]  Subjective/Objective Assessment:   Admitted with syncope, SAH.     Action/Plan:   Anticipated DC Date:  09/19/2012   Anticipated DC Plan:  HOME/SELF CARE      DC Planning Services  CM consult      Choice offered to / List presented to:             Status of service:  Completed, signed off Medicare Important Message given?   (If response is "NO", the following Medicare IM given date fields will be blank) Date Medicare IM given:   Date Additional Medicare IM given:    Discharge Disposition:  HOME/SELF CARE  Per UR Regulation:  Reviewed for med. necessity/level of care/duration of stay  If discussed at Long Length of Stay Meetings, dates discussed:    Comments:

## 2012-09-22 NOTE — ED Provider Notes (Signed)
Medical screening examination/treatment/procedure(s) were conducted as a shared visit with non-physician practitioner(s) and myself.  I personally evaluated the patient during the encounter.  Status post fall with small subarachnoid hemorrhage.  Neuro exam normal. Admit   Donnetta Hutching, MD 09/22/12 [phone]

## 2012-10-08 ENCOUNTER — Telehealth: Payer: Self-pay | Admitting: *Deleted

## 2012-10-08 DIAGNOSIS — R0989 Other specified symptoms and signs involving the circulatory and respiratory systems: Secondary | ICD-10-CM

## 2012-10-08 NOTE — Telephone Encounter (Signed)
Pt elected not to receive event monitor D/T out of pocket cost, financial hardship papers were given to Pt. 10/08/12 TK

## 2013-10-13 ENCOUNTER — Encounter (HOSPITAL_COMMUNITY): Payer: Self-pay | Admitting: Emergency Medicine

## 2013-10-13 ENCOUNTER — Emergency Department (HOSPITAL_COMMUNITY)
Admission: EM | Admit: 2013-10-13 | Discharge: 2013-10-13 | Disposition: A | Discharge status: Home / Self Care | Payer: Self-pay | Attending: Emergency Medicine | Admitting: Emergency Medicine

## 2013-10-13 ENCOUNTER — Emergency Department (HOSPITAL_COMMUNITY): Payer: Self-pay

## 2013-10-13 DIAGNOSIS — F17200 Nicotine dependence, unspecified, uncomplicated: Secondary | ICD-10-CM | POA: Insufficient documentation

## 2013-10-13 DIAGNOSIS — S82832A Other fracture of upper and lower end of left fibula, initial encounter for closed fracture: Secondary | ICD-10-CM

## 2013-10-13 DIAGNOSIS — Y9289 Other specified places as the place of occurrence of the external cause: Secondary | ICD-10-CM | POA: Insufficient documentation

## 2013-10-13 DIAGNOSIS — Y939 Activity, unspecified: Secondary | ICD-10-CM | POA: Insufficient documentation

## 2013-10-13 DIAGNOSIS — S82409A Unspecified fracture of shaft of unspecified fibula, initial encounter for closed fracture: Secondary | ICD-10-CM | POA: Insufficient documentation

## 2013-10-13 DIAGNOSIS — W010XXA Fall on same level from slipping, tripping and stumbling without subsequent striking against object, initial encounter: Secondary | ICD-10-CM | POA: Insufficient documentation

## 2013-10-13 MED ORDER — TRAMADOL HCL 50 MG PO TABS: 50.0000 mg | ORAL_TABLET | Freq: Four times a day (QID) | ORAL | Status: DC | PRN

## 2013-10-13 NOTE — Discharge Instructions (Signed)
Cast or Splint Care Casts and splints support injured limbs and keep bones from moving while they heal.  HOME CARE  Keep the cast or splint uncovered during the drying period.  A plaster cast can take 24 to 48 hours to dry.  A fiberglass cast will dry in less than 1 hour.  Do not rest the cast on anything harder than a pillow for 24 hours.  Do not put weight on your injured limb. Do not put pressure on the cast. Wait for your doctor's approval.  Keep the cast or splint dry.  Cover the cast or splint with a plastic bag during baths or wet weather.  If you have a cast over your chest and belly (trunk), take sponge baths until the cast is taken off.  If your cast gets wet, dry it with a towel or blow dryer. Use the cool setting on the blow dryer.  Keep your cast or splint clean. Wash a dirty cast with a damp cloth.  Do not put any objects under your cast or splint.  Do not scratch the skin under the cast with an object. If itching is a problem, use a blow dryer on a cool setting over the itchy area.  Do not trim or cut your cast.  Do not take out the padding from inside your cast.  Exercise your joints near the cast as told by your doctor.  Raise (elevate) your injured limb on 1 or 2 pillows for the first 1 to 3 days. GET HELP IF:  Your cast or splint cracks.  Your cast or splint is too tight or too loose.  You itch badly under the cast.  Your cast gets wet or has a soft spot.  You have a bad smell coming from the cast.  You get an object stuck under the cast.  Your skin around the cast becomes red or sore.  You have new or more pain after the cast is put on. GET HELP RIGHT AWAY IF:  You have fluid leaking through the cast.  You cannot move your fingers or toes.  Your fingers or toes turn blue or white or are cool, painful, or puffy (swollen).  You have tingling or lose feeling (numbness) around the injured area.  You have bad pain or pressure under the  cast.  You have trouble breathing or have shortness of breath.  You have chest pain. Document Released: 01/16/2011 Document Revised: 05/19/2013 Document Reviewed: 03/25/2013 [facility] Patient Information 2014 [place], [place].  Ankle Fracture A fracture is a break in a bone. A cast or splint may be used to protect the ankle and heal the break. Sometimes, surgery is needed. HOME CARE  Use crutches as told by your doctor. It is very important that you use your crutches correctly.  Do not put weight or pressure on the injured ankle until told by your doctor.  Keep your ankle raised (elevated) when sitting or lying down.  Apply ice to the ankle:  Put ice in a plastic bag.  Place a towel between your cast and the bag.  Leave the ice on for 20 minutes, 2 3 times a day.  If you have a plaster or fiberglass cast:  Do not try to scratch under the cast with any objects.  Check the skin around the cast every day. You may put lotion on red or sore areas.  Keep your cast dry and clean.  If you have a plaster splint:  Wear the splint as  told by your doctor.  You can loosen the elastic around the splint if your toes get numb, tingle, or turn cold or blue.  Do not put pressure on any part of your cast or splint. It may break. Rest your plaster splint or cast only on a pillow the first 24 hours until it is fully hardened.  Cover your cast or splint with a plastic bag during showers.  Do not lower your cast or splint into water.  Take medicine as told by your doctor.  Do not drive until your doctor says it is safe.  Follow-up with your doctor as told. It is very important that you go to your follow-up visits. GET HELP IF: The swelling and discomfort gets worse.  GET HELP RIGHT AWAY IF:   Your splint or cast breaks.  You continue to have very bad pain.  You have new pain or swelling after your splint or cast was put on.  Your skin or toes below the injured ankle:  Turn blue  or gray.  Feel cold, numb, or you cannot feel them.  There is a bad smell or yellowish white fluid (pus) coming from under the splint or cast. MAKE SURE YOU:   Understand these instructions.  Will watch your condition.  Will get help right away if you are not doing well or get worse. Document Released: 07/14/2009 Document Revised: 07/07/2013 Document Reviewed: 04/15/2013 [facility] Patient Information 2014 [place], [place].

## 2013-10-13 NOTE — ED Provider Notes (Signed)
CSN: [id number]     Arrival date & time 10/13/13  1043 History  This chart was scribed for non-physician practitioner Junius FinnerErin O'Malley, PA-C working with Shon Batonourtney F Horton, MD by Danella Maiersaroline Early, ED Scribe. This patient was seen in room TR11C/TR11C and the patient's care was started at 11:24 AM.    Chief Complaint  Patient presents with  . Ankle Pain   The history is provided by the patient. No language interpreter was used.   HPI Comments: Erik Ward is a 58 y.o. male who presents to the Emergency Department complaining of constant left ankle pain onset 8 days ago after slipping and falling down a hill. He states he only has pain with movement but not with bearing weight. He has been taking Aleve for the pain, last dose was this morning. Pain is 10/10 at worse, aching and throbbing at times. He denies prior h/o ankle injury.    Past Medical History  Diagnosis Date  . Syncope and collapse 09/18/2012    w/loss of consciousness (09/18/2012)  . Subarachnoid hemorrhage following injury, with loss of consciousness 09/18/2012    syncope and collapse (09/18/2012)   Past Surgical History  Procedure Laterality Date  . Percutaneous pinning phalanx fracture of hand  1980's    "right" (09/18/2012)   No family history on file. History  Substance Use Topics  . Smoking status: Current Every Day Smoker -- 0.50 packs/day for 40 years    Types: Cigarettes  . Smokeless tobacco: Never Used     Comment: 09/18/2012 offered smoking cessation materials; pt declines  . Alcohol Use: No    Review of Systems  Musculoskeletal: Positive for arthralgias (left ankle).  All other systems reviewed and are negative.    Allergies  Review of patient's allergies indicates no known allergies.  Home Medications   Current Outpatient Rx  Name  Route  Sig  Dispense  Refill  . naproxen sodium (ANAPROX) 220 MG tablet   Oral   Take 220 mg by mouth 2 (two) times daily as needed (pain).         . traMADol  (ULTRAM) 50 MG tablet   Oral   Take 1 tablet (50 mg total) by mouth every 6 (six) hours as needed.   15 tablet   0    BP 127/76  Pulse 98  Temp(Src) 98.7 F (37.1 C)  Resp 16  SpO2 98% Physical Exam  Nursing note and vitals reviewed. Constitutional: He appears well-developed and well-nourished.  HENT:  Head: Normocephalic and atraumatic.  Eyes: Conjunctivae are normal. No scleral icterus.  Neck: Normal range of motion.  Cardiovascular: Normal rate, regular rhythm and normal heart sounds.   Pulmonary/Chest: Effort normal and breath sounds normal. No respiratory distress. He has no wheezes. He has no rales. He exhibits no tenderness.  Abdominal: Soft. Bowel sounds are normal. He exhibits no distension and no mass. There is no tenderness. There is no rebound and no guarding.  Musculoskeletal: Normal range of motion.  Left lateral malleolus - mild edema with tenderness. Skin intact no ecchymosis or erythema. no obvious deformity.  Able to bear weight. pain with plantar flexion. Pedal pulses 2+.  Neurological: He is alert.  Skin: Skin is warm and dry.    ED Course  Procedures (including critical care time) Medications - No data to display  DIAGNOSTIC STUDIES: Oxygen Saturation is 98% on RA, normal by my interpretation.    COORDINATION OF CARE: 11:48 AM- Discussed treatment plan with pt which includes Cam walker  and f/u with orthopaedist. Pt agrees to plan.    Labs Review Labs Reviewed - No data to display Imaging Review Dg Ankle Complete Left  10/13/2013   CLINICAL DATA:  Twisted ankle last week with persistent pain and swelling  EXAM: LEFT ANKLE COMPLETE - 3+ VIEW  COMPARISON:  None.  FINDINGS: The provided ankle mortise radiograph is degraded due to obliquity.  There is a minimally displaced oblique fracture of the distal fibula with apparent extension to the distal tib-fib joint. No definite widening of the ankle mortise given obliquity. Expected adjacent soft tissue  swelling. Small ankle joint effusion is suspected. Small plantar calcaneal spur. No radiopaque foreign body.  IMPRESSION: Minimally displaced oblique fracture of the distal fibular with extension to the distal tib-fib joint.   Electronically Signed   By: Simonne Come M.D.   On: 10/13/2013 11:35    EKG Interpretation   None       MDM   1. Fracture of fibula, distal, left, closed    Pt has minimally displaced oblique fracture of distal left fibula from slipping on a hill 8 days ago. Pt has been weight bearing and ambulatory since incident.  Will place in Cam walker boot and have pt f/u with [facility] orthopedics next week. Pt verbalized understanding and agreement with tx plan.  I personally performed the services described in this documentation, which was scribed in my presence. The recorded information has been reviewed and is accurate.    Junius Finner, PA-C 10/13/13 1404

## 2013-10-13 NOTE — ED Provider Notes (Signed)
Medical screening examination/treatment/procedure(s) were performed by non-physician practitioner and as supervising physician I was immediately available for consultation/collaboration.  EKG Interpretation   None         Courtney F Horton, MD 10/13/13 2006 

## 2013-10-13 NOTE — Progress Notes (Signed)
Orthopedic Tech Progress Note Patient Details:  Erik SimmeringSammie Ward [birth year] [id number]  Ortho Devices Type of Ortho Device: CAM walker Ortho Device/Splint Location: lle Ortho Device/Splint Interventions: Application   Erik Ward 10/13/2013, 2:18 PM

## 2013-10-13 NOTE — ED Notes (Signed)
Left ankle pain x 8 days after slipping down hill

## 2013-10-29 ENCOUNTER — Encounter: Payer: Self-pay | Admitting: Internal Medicine

## 2013-10-29 ENCOUNTER — Ambulatory Visit: Payer: Self-pay | Attending: Internal Medicine | Admitting: Internal Medicine

## 2013-10-29 VITALS — BP 145/88 | HR 82 | Temp 98.9°F | Resp 14 | Ht 66.0 in | Wt 188.4 lb

## 2013-10-29 DIAGNOSIS — W19XXXA Unspecified fall, initial encounter: Secondary | ICD-10-CM | POA: Insufficient documentation

## 2013-10-29 DIAGNOSIS — S82892A Other fracture of left lower leg, initial encounter for closed fracture: Secondary | ICD-10-CM

## 2013-10-29 DIAGNOSIS — S82899A Other fracture of unspecified lower leg, initial encounter for closed fracture: Secondary | ICD-10-CM | POA: Insufficient documentation

## 2013-10-29 LAB — COMPLETE METABOLIC PANEL WITH GFR
ALBUMIN: 4.2 g/dL (ref 3.5–5.2)
ALT: 12 U/L (ref 0–53)
AST: 15 U/L (ref 0–37)
Alkaline Phosphatase: 74 U/L (ref 39–117)
BUN: 14 mg/dL (ref 6–23)
CALCIUM: 9.2 mg/dL (ref 8.4–10.5)
CO2: 26 mEq/L (ref 19–32)
CREATININE: 1 mg/dL (ref 0.50–1.35)
Chloride: 104 mEq/L (ref 96–112)
GFR, Est African American: >89 mL/min
GFR, Est Non African American: 83 mL/min
GLUCOSE: 92 mg/dL (ref 70–99)
POTASSIUM: 4.3 meq/L (ref 3.5–5.3)
Sodium: 137 mEq/L (ref 135–145)
Total Bilirubin: 0.4 mg/dL (ref 0.2–1.2)
Total Protein: 7.2 g/dL (ref 6.0–8.3)

## 2013-10-29 LAB — LIPID PANEL
Cholesterol: 181 mg/dL (ref 0–200)
HDL: 37 mg/dL — ABNORMAL LOW (ref >39)
LDL CALC: 105 mg/dL — AB (ref 0–99)
TRIGLYCERIDES: 194 mg/dL — AB (ref <150)
Total CHOL/HDL Ratio: 4.9 Ratio
VLDL: 39 mg/dL (ref 0–40)

## 2013-10-29 LAB — CBC WITH DIFFERENTIAL/PLATELET
BASOS PCT: 1 % (ref 0–1)
Basophils Absolute: 0 10*3/uL (ref 0.0–0.1)
Eosinophils Absolute: 0.1 10*3/uL (ref 0.0–0.7)
Eosinophils Relative: 2 % (ref 0–5)
HCT: 48.7 % (ref 39.0–52.0)
HEMOGLOBIN: 16.6 g/dL (ref 13.0–17.0)
Lymphocytes Relative: 38 % (ref 12–46)
Lymphs Abs: 2.1 10*3/uL (ref 0.7–4.0)
MCH: 31 pg (ref 26.0–34.0)
MCHC: 34.1 g/dL (ref 30.0–36.0)
MCV: 90.9 fL (ref 78.0–100.0)
MONOS PCT: 11 % (ref 3–12)
Monocytes Absolute: 0.6 10*3/uL (ref 0.1–1.0)
NEUTROS ABS: 2.7 10*3/uL (ref 1.7–7.7)
Neutrophils Relative %: 48 % (ref 43–77)
Platelets: 300 10*3/uL (ref 150–400)
RBC: 5.36 MIL/uL (ref 4.22–5.81)
RDW: 14.1 % (ref 11.5–15.5)
WBC: 5.6 10*3/uL (ref 4.0–10.5)

## 2013-10-29 NOTE — Progress Notes (Signed)
Pt is here to establish care. Referred from the ED as a f/u fractured leg. No pain today. No complaints today.

## 2013-10-29 NOTE — Progress Notes (Signed)
Patient ID: Erik Ward, male   DOB: [birth year], 58 y.o.   MRN: [id number]   CC:  HPI: 58 year old male here to establish care, recently in the emergency department after a fall. He states that he did not lose consciousness and he slid on wet mud and fell down a slope,he denies any episodes of syncope near-syncope, trauma to his head. No past medical history according to the patient other than hospitalization in 2013 for loss of consciousness  Social history nonsmoker nonalcoholic Family history reviewed and found to be negative    No Known Allergies Past Medical History  Diagnosis Date  . Syncope and collapse 09/18/2012    w/loss of consciousness (09/18/2012)  . Subarachnoid hemorrhage following injury, with loss of consciousness 09/18/2012    syncope and collapse (09/18/2012)   Current Outpatient Prescriptions on File Prior to Visit  Medication Sig Dispense Refill  . naproxen sodium (ANAPROX) 220 MG tablet Take 220 mg by mouth 2 (two) times daily as needed (pain).      . traMADol (ULTRAM) 50 MG tablet Take 1 tablet (50 mg total) by mouth every 6 (six) hours as needed.  15 tablet  0   No current facility-administered medications on file prior to visit.   History reviewed. No pertinent family history. History   Social History  . Marital Status: Married    Spouse Name: N/A    Number of Children: N/A  . Years of Education: N/A   Occupational History  . Not on file.   Social History Main Topics  . Smoking status: Current Every Day Smoker -- 0.50 packs/day for 40 years    Types: Cigarettes  . Smokeless tobacco: Never Used     Comment: 09/18/2012 offered smoking cessation materials; pt declines  . Alcohol Use: No  . Drug Use: No  . Sexual Activity: Not Currently   Other Topics Concern  . Not on file   Social History Narrative  . No narrative on file    Review of Systems  Constitutional: Negative for fever, chills, diaphoresis, activity change, appetite change  and fatigue.  HENT: Negative for ear pain, nosebleeds, congestion, facial swelling, rhinorrhea, neck pain, neck stiffness and ear discharge.   Eyes: Negative for pain, discharge, redness, itching and visual disturbance.  Respiratory: Negative for cough, choking, chest tightness, shortness of breath, wheezing and stridor.   Cardiovascular: Negative for chest pain, palpitations and leg swelling.  Gastrointestinal: Negative for abdominal distention.  Genitourinary: Negative for dysuria, urgency, frequency, hematuria, flank pain, decreased urine volume, difficulty urinating and dyspareunia.  Musculoskeletal: Positive for arthralgias (left ankle Neurological: Negative for dizziness, tremors, seizures, syncope, facial asymmetry, speech difficulty, weakness, light-headedness, numbness and headaches.  Hematological: Negative for adenopathy. Does not bruise/bleed easily.  Psychiatric/Behavioral: Negative for hallucinations, behavioral problems, confusion, dysphoric mood, decreased concentration and agitation.    Objective:   Filed Vitals:   10/29/13 1549  BP: 145/88  Pulse: 82  Temp: 98.9 F (37.2 C)  Resp: 14    Physical Exam  Constitutional: Appears well-developed and well-nourished. No distress.  HENT: Normocephalic. External right and left ear normal. Oropharynx is clear and moist.  Eyes: Conjunctivae and EOM are normal. PERRLA, no scleral icterus.  Neck: Normal ROM. Neck supple. No JVD. No tracheal deviation. No thyromegaly.  CVS: RRR, S1/S2 +, no murmurs, no gallops, no carotid bruit.  Pulmonary: Effort and breath sounds normal, no stridor, rhonchi, wheezes, rales.  Abdominal: Soft. BS +,  no distension, tenderness, rebound or guarding.  Musculoskeletal:Left lateral malleolus -  mild edema with tenderness. Skin intact no ecchymosis or erythema. no obvious deformity. Able to bear weight. pain with plantar flexion. Pedal pulses 2+.   Neuro: Alert. Normal reflexes, muscle tone  coordination. No cranial nerve deficit. Skin: Skin is warm and dry. No rash noted. Not diaphoretic. No erythema. No pallor.  Psychiatric: Normal mood and affect. Behavior, judgment, thought content normal.   Lab Results  Component Value Date   WBC 2.9* 09/19/2012   HGB 12.8* 09/19/2012   HCT 39.8 09/19/2012   MCV 89.8 09/19/2012   PLT 177 09/19/2012   Lab Results  Component Value Date   CREATININE 1.05 09/19/2012   BUN 5* 09/19/2012   NA 137 09/19/2012   K 4.6 09/19/2012   CL 104 09/19/2012   CO2 26 09/19/2012    Lab Results  Component Value Date   HGBA1C 6.3* 09/18/2012   Lipid Panel  No results found for this basename: chol, trig, hdl, cholhdl, vldl, ldlcalc       Assessment and plan:   Patient Active Problem List   Diagnosis Date Noted  . Right shoulder pain 09/19/2012  . Syncope 09/18/2012  . Subarachnoid hemorrhage 09/18/2012  . Nausea vomiting and diarrhea 09/18/2012  . Shoulder pain, right 09/18/2012  . Tobacco abuse 09/18/2012   Minimally displaced oblique fracture of the distal fibular  Orthopedic referral  Pain control seems adequate Continue to wear brace  Established care Obtain baseline labs Patient refusing colonoscopy, refusing immunization, Notiffied the patient that if his CBC is low, he will have to consider a colonoscopy Followup in 3 months     The patient was given clear instructions to go to ER or return to medical center if symptoms don't improve, worsen or new problems develop. The patient verbalized understanding. The patient was told to call to get any lab results if not heard anything in the next week.

## 2013-10-30 LAB — VITAMIN D 25 HYDROXY (VIT D DEFICIENCY, FRACTURES): Vit D, 25-Hydroxy: 12 ng/mL — ABNORMAL LOW (ref 30–89)

## 2013-10-30 LAB — TSH: TSH: 0.436 u[IU]/mL (ref 0.350–4.500)

## 2013-11-04 ENCOUNTER — Telehealth: Payer: Self-pay | Admitting: *Deleted

## 2013-11-04 MED ORDER — VITAMIN D (ERGOCALCIFEROL) 1.25 MG (50000 UNIT) PO CAPS: 50000.0000 [IU] | ORAL_CAPSULE | ORAL | Status: DC

## 2013-11-04 NOTE — Telephone Encounter (Signed)
Left pt a voicemail to give us a call back. Prescription is filled.

## 2013-11-04 NOTE — Telephone Encounter (Signed)
Message copied by Margery Szostak, [place] on Thu Nov 04, 2013  5:00 PM ------      Message from: Susie CassetteABROL MD, Germain OsgoodNAYANA      Created: Tue Nov 02, 2013  2:33 PM       Notify patient of labs are normal with the exception of vitamin D, [employer] in a prescription for vitamin D 50,000 units weekly, 10 tablets with 2 refills ------

## 2013-11-18 NOTE — Addendum Note (Signed)
Addended by: Susie CassetteABROL MD, Germain OsgoodNAYANA on: 11/18/2013 10:40 AM   Modules accepted: Orders

## 2013-11-26 ENCOUNTER — Ambulatory Visit (INDEPENDENT_AMBULATORY_CARE_PROVIDER_SITE_OTHER): Payer: Self-pay | Admitting: Sports Medicine

## 2013-11-26 ENCOUNTER — Encounter: Payer: Self-pay | Admitting: Sports Medicine

## 2013-11-26 ENCOUNTER — Ambulatory Visit
Admission: RE | Admit: 2013-11-26 | Discharge: 2013-11-26 | Disposition: A | Discharge status: Home / Self Care | Payer: No Typology Code available for payment source | Source: Ambulatory Visit | Attending: Sports Medicine | Admitting: Sports Medicine

## 2013-11-26 VITALS — BP 127/87 | HR 92 | Ht 66.0 in | Wt 188.0 lb

## 2013-11-26 DIAGNOSIS — S82409A Unspecified fracture of shaft of unspecified fibula, initial encounter for closed fracture: Secondary | ICD-10-CM

## 2013-11-26 DIAGNOSIS — M25572 Pain in left ankle and joints of left foot: Secondary | ICD-10-CM

## 2013-11-26 DIAGNOSIS — S82402A Unspecified fracture of shaft of left fibula, initial encounter for closed fracture: Secondary | ICD-10-CM

## 2013-11-26 NOTE — Progress Notes (Signed)
Subjective:    Patient ID: Erik Ward is a 58 y.o. male.  Chief Complaint: Ankle Injury   Erik Ward is a 58[facility]. The injury occurred on Jan 4; pt reports he fell after he slipped in the mud. He doesn't know which way his ankle twisted and he did not definitely hear or sense a pop or snap at the time of the injury. He has not sprained this ankle in the past to his knowledge. He had swelling and pain diffusely in his ankle for the next several days after his fall and he presented to the ED on 1/14 with xrays showing nondisplaced oblique distal fibula fracture. He was given a cam walker which he has used since that time (about 6 weeks total, now), which has helped his symptoms greatly. [facility], which he did not do due to insurance problems. However, he has had greatly improved pain (no pain currently) and only mild swelling of the ankle since being seen in the ED. He has not had any further falls and has not noted any gait instability or weakness, but he has not been walking at all without using the cam walker since his ED visit, as above.  Past Medical History  Diagnosis Date  . Syncope and collapse 09/18/2012    w/loss of consciousness (09/18/2012)  . Subarachnoid hemorrhage following injury, with loss of consciousness 09/18/2012    syncope and collapse (09/18/2012)   Past Surgical History  Procedure Laterality Date  . Percutaneous pinning phalanx fracture of hand  1980's    "right" (09/18/2012)   Family history: Noncontributory.   Social History   Occupational History  . Not on file.   Social History Main Topics  . Smoking status: Current Every Day Smoker -- 0.50 packs/day for 40 years    Types: Cigarettes  . Smokeless tobacco: Never Used     Comment: 09/18/2012 offered smoking cessation materials; pt declines  . Alcohol Use: No  . Drug Use: No  . Sexual Activity: Not  Currently   In addition to the above documentation, pt's PMH, surgical history, FH, and SH all reviewed and updated where appropriate in the EMR. I have also reviewed and updated the pt's allergies and current medications as appropriate.   ROS: As above per HPI. Otherwise, pt generally feels well and full 12-system ROS was reviewed and all negative.     Objective:    Ortho Exam / Physical Exam BP 127/87  Pulse 92  Ht 5\' 6"  (1.676 m)  Wt 188 lb (85.276 kg)  BMI 30.36 kg/m2 Gen: well-appearing adult male in NAD, very pleasant MSK:   Bilateral ankles normal to appearance with exception of mild lateral swelling of left ankle  No frank redness / bruising of either ankle  No tenderness to palpation along joint lines of ankle, heel, calf, anterior ankle, or either malleolus  Normal passive ROM through ankle and no pain with active ROM against resistance  No joint laxity / instability and pt able to stand and ambulate without obvious pain / difficulty without cam walker Neurovascular:  Alert / oriented, no gross focal deficit, no sensory deficit of LE bilaterally  Strength 5/5 in ankle flexion / extension and eversion / inversion against resistance  Feet warm, well-perfused, DP pulse palpable / symmetric bilaterally Skin: no gross LE rash or other lesions, bilaterally Pulm: normal WOB, pt speaks in full sentences  Assessment:     1. Closed left fibular fracture       Plan:     Impression of clinically well-healing closed left distal fibula fracture s/p cam walker boot use consistently for about 6 weeks. Plan to repeat AP, lateral, and mortise xrays today, then pt will follow up early next week for re-evaluation and possible descalation to smaller, less extensive bracing with ROM / strengthening exercises. Pt will continue cam walker use at least until follow-up, for now. If xrays demonstrate displacement or non-union, surgical referral may be necessary.  The above was discussed in  its entirety with attending Dr. Margaretha Sheffield. Bobbye Morton, MD PGY-2, [facility] 11/26/2013, 12:32 PM

## 2013-11-26 NOTE — Patient Instructions (Signed)
Your fracture is probably healing well, but we want to get another set of xrays. Go get your xrays today. Come back to see Dr. Margaretha Sheffieldraper early next week, Tuesday morning. Keep wearing the boot until you come back next week. When you come back, we might get you out of the boot and into a different, smaller brace.

## 2013-11-30 ENCOUNTER — Ambulatory Visit: Payer: Self-pay | Admitting: Sports Medicine

## 2013-12-08 ENCOUNTER — Ambulatory Visit: Payer: Self-pay | Admitting: Sports Medicine

## 2013-12-13 ENCOUNTER — Encounter: Payer: Self-pay | Admitting: Sports Medicine

## 2013-12-13 ENCOUNTER — Ambulatory Visit (INDEPENDENT_AMBULATORY_CARE_PROVIDER_SITE_OTHER): Payer: Self-pay | Admitting: Sports Medicine

## 2013-12-13 VITALS — BP 121/81 | HR 93 | Ht 66.0 in | Wt 188.0 lb

## 2013-12-13 DIAGNOSIS — S82409A Unspecified fracture of shaft of unspecified fibula, initial encounter for closed fracture: Secondary | ICD-10-CM

## 2013-12-13 DIAGNOSIS — S82402A Unspecified fracture of shaft of left fibula, initial encounter for closed fracture: Secondary | ICD-10-CM

## 2013-12-13 NOTE — Progress Notes (Signed)
   Subjective:    Patient ID: Erik SimmeringSammie Ward, male    DOB: May 27, 1956, 58 y.o.   MRN: [id number]  HPI Patient comes in today for followup on his nondisplaced left distal fibula fracture. X-rays on February 27 showed a well-healing fracture. He has no pain but is hesitant about coming out of his Cam Walker.    Review of Systems     Objective:   Physical Exam Well-developed, well-nourished. No acute distress  Left ankle: Decreased range of motion in all planes secondary to stiffness. There is mild calf atrophy. No tenderness to palpation over the fracture site. No swelling. Neurovascularly intact distally.  X-rays are as above      Assessment & Plan:  Healing nondisplaced distal fibula fracture, left ankle  Discontinue the Cam Walker in favor of a smaller Aircast. Patient needs to start working on increasing his range of motion and strengthening. This will be somewhat challenging since he has no insurance. I went over some home exercises with him. I would like to see him back in the office in 3 weeks for followup. Call with questions or concerns in the interim.

## 2013-12-27 ENCOUNTER — Ambulatory Visit: Payer: Self-pay | Admitting: Internal Medicine

## 2014-01-05 ENCOUNTER — Ambulatory Visit: Payer: Self-pay | Admitting: Sports Medicine

## 2014-07-15 ENCOUNTER — Other Ambulatory Visit: Payer: Self-pay

## 2014-09-19 IMAGING — CR DG ANKLE COMPLETE 3+V*L*
3 series · 3 of 3 positions shown · non-contrast
Comparison: None.

CLINICAL DATA: Twisted ankle last week with persistent pain and
swelling

EXAM:
LEFT ANKLE COMPLETE - 3+ VIEW

[x ankle ap left]
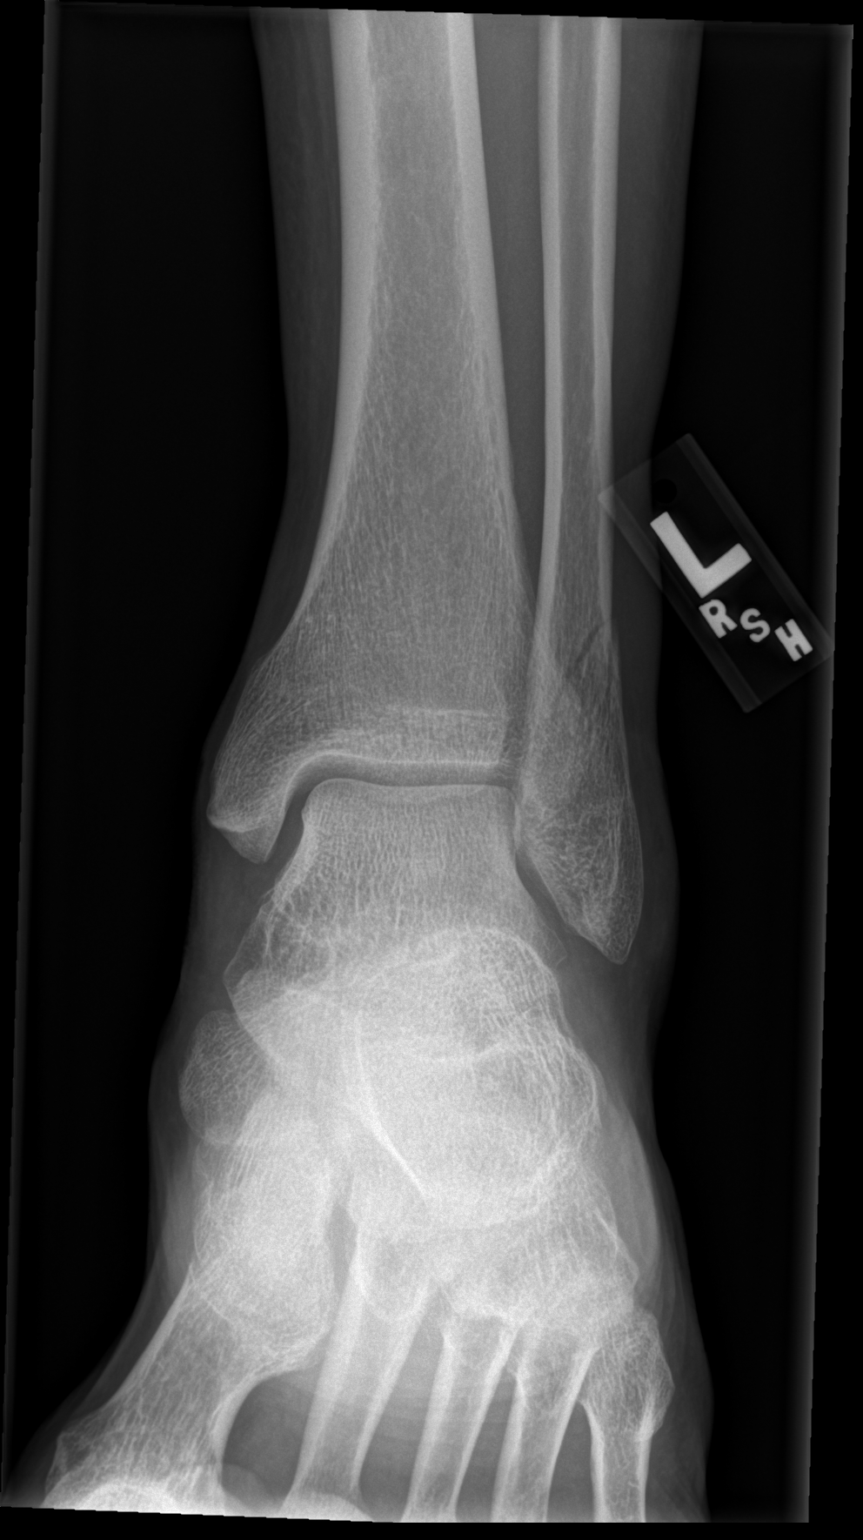

[x ankle obl left]
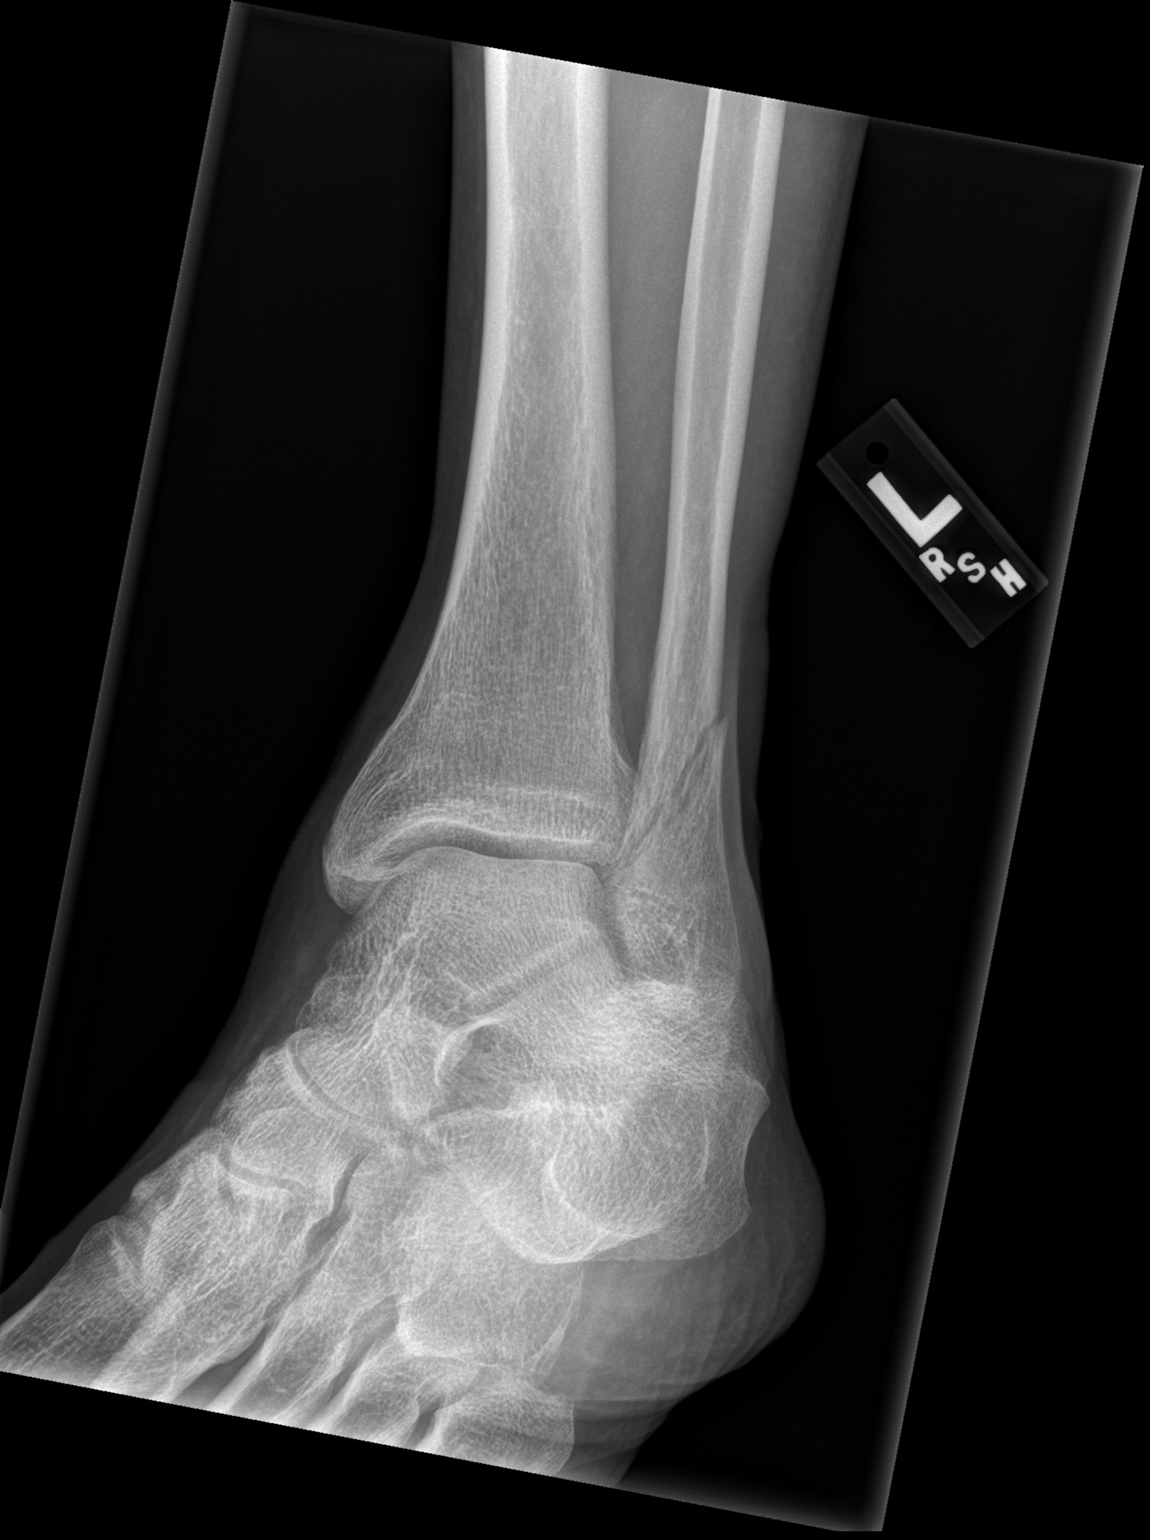

[x ankle lat left]
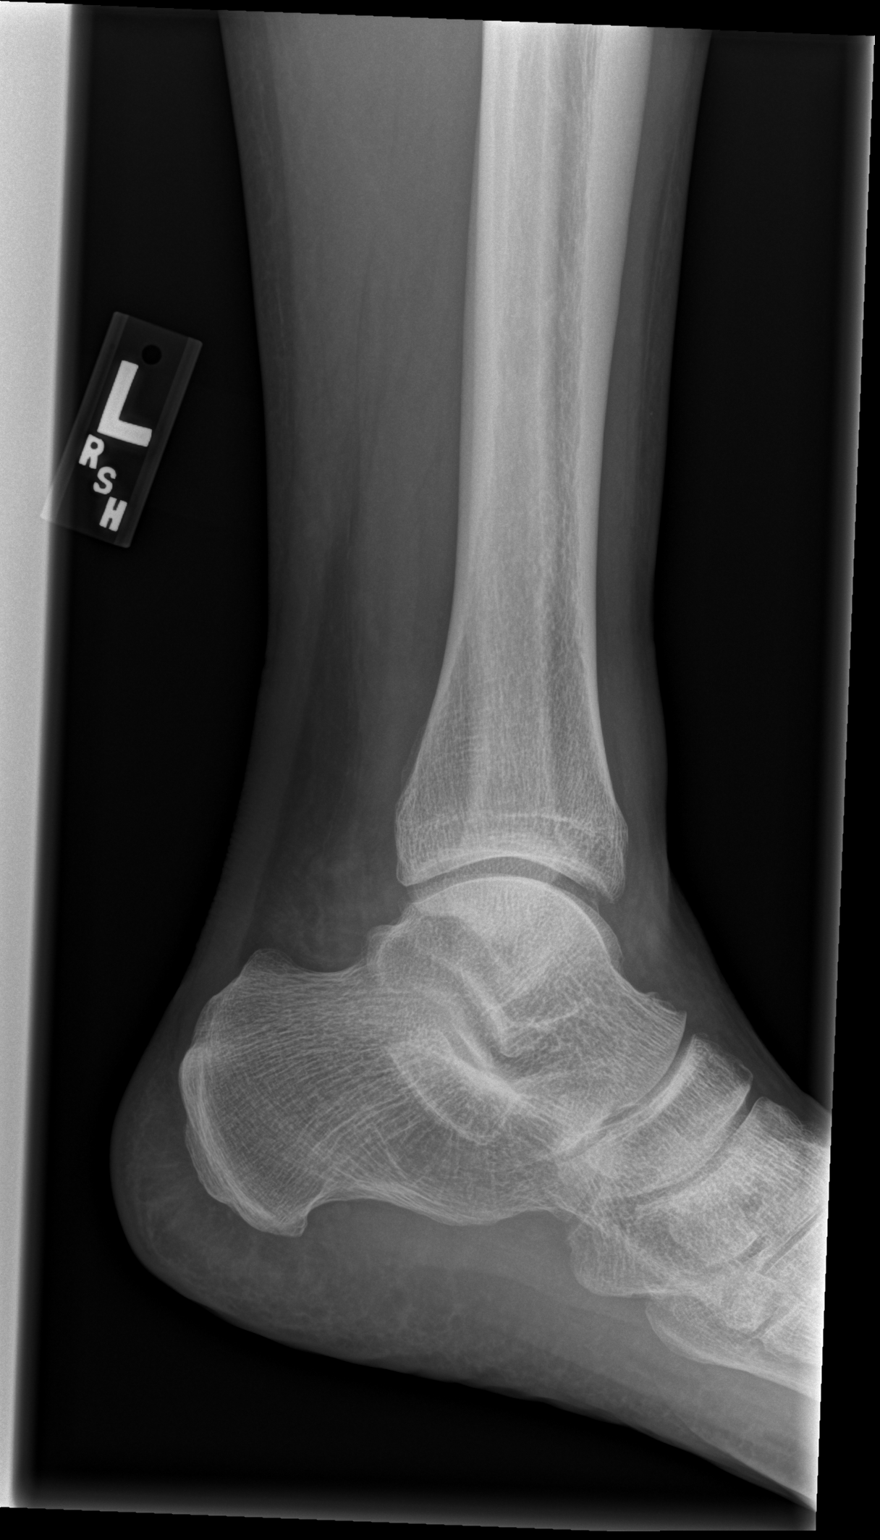

[3 of 3 positions shown; findings below may reference images not displayed]

FINDINGS: The provided ankle mortise radiograph is degraded due to obliquity.

There is a minimally displaced oblique fracture of the distal fibula
with apparent extension to the distal tib-fib joint. No definite
widening of the ankle mortise given obliquity. Expected adjacent
soft tissue swelling. Small ankle joint effusion is suspected. Small
plantar calcaneal spur. No radiopaque foreign body.
IMPRESSION: Minimally displaced oblique fracture of the distal fibular with
extension to the distal tib-fib joint.

## 2014-11-02 IMAGING — CR DG ANKLE COMPLETE 3+V*L*
3 series · 3 of 3 positions shown · non-contrast
Comparison: Left ankle films of 10/13/2013

CLINICAL DATA: History of left ankle fracture in [REDACTED], followup

EXAM:
LEFT ANKLE COMPLETE - 3+ VIEW

[view not recorded (1 of 3)]
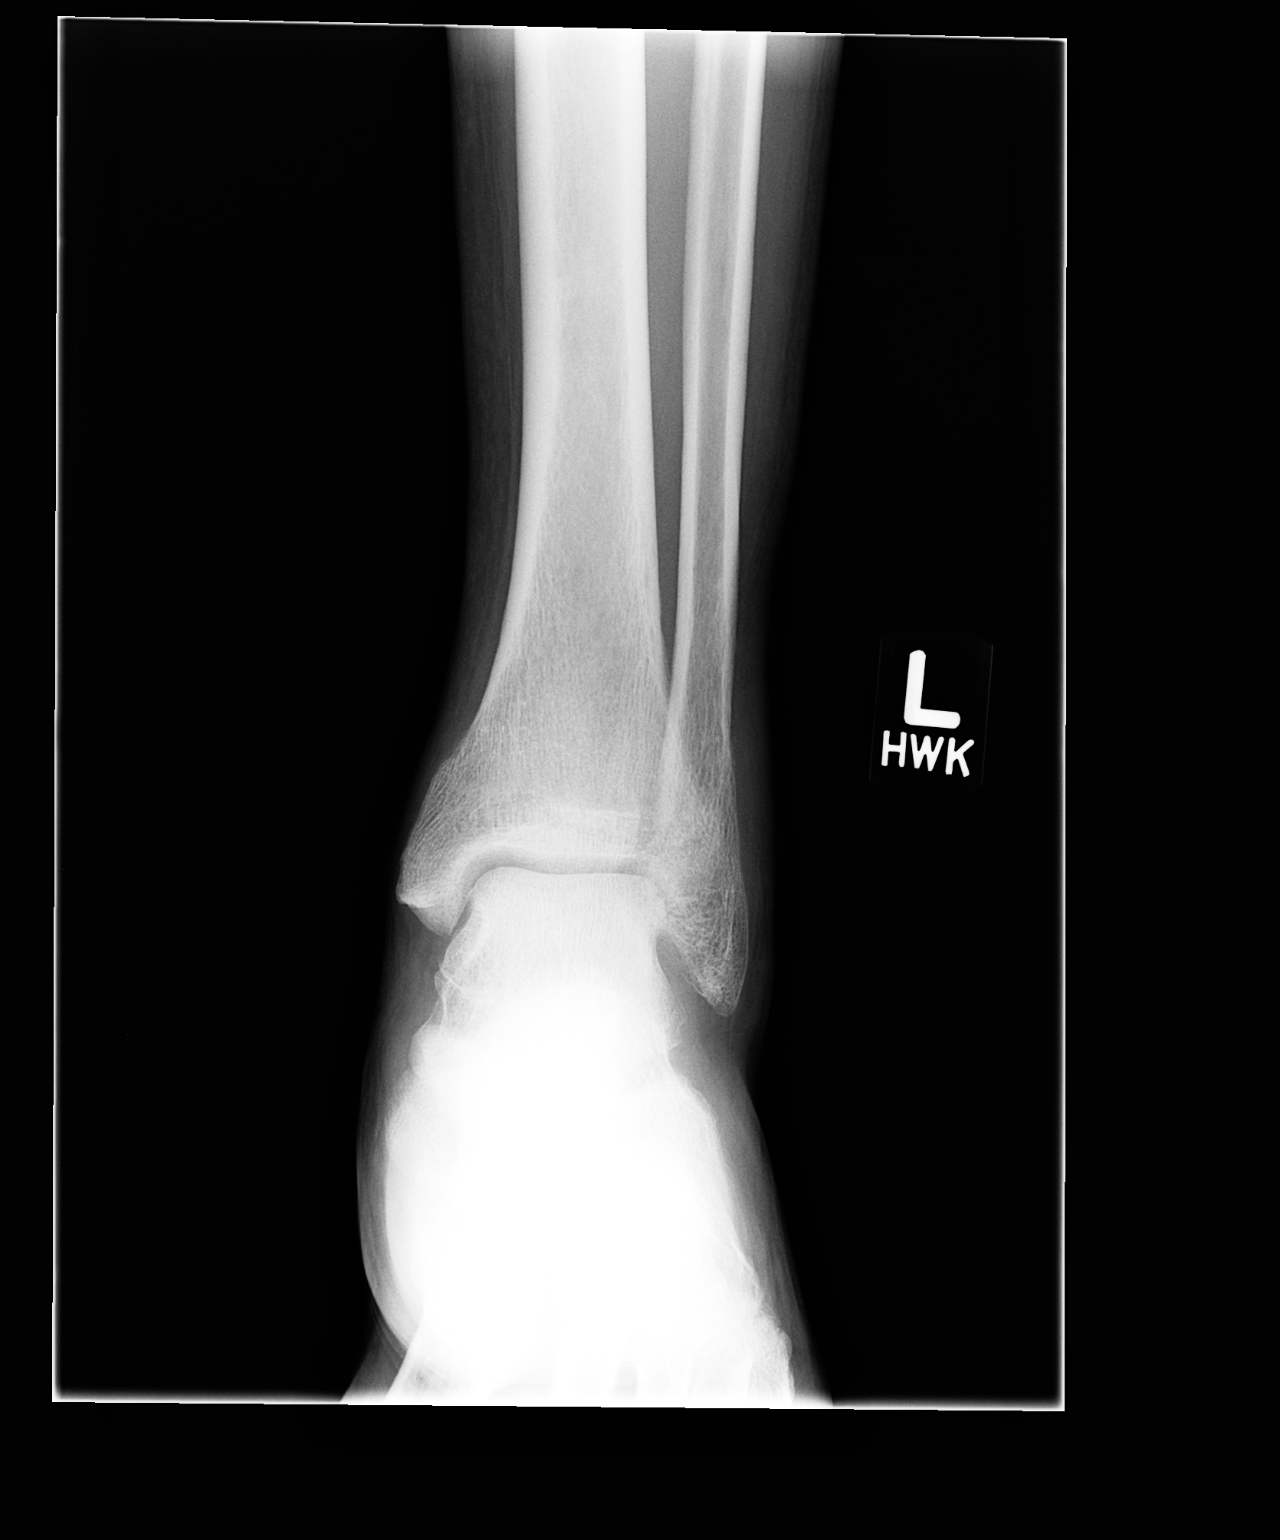

[view not recorded (2 of 3)]
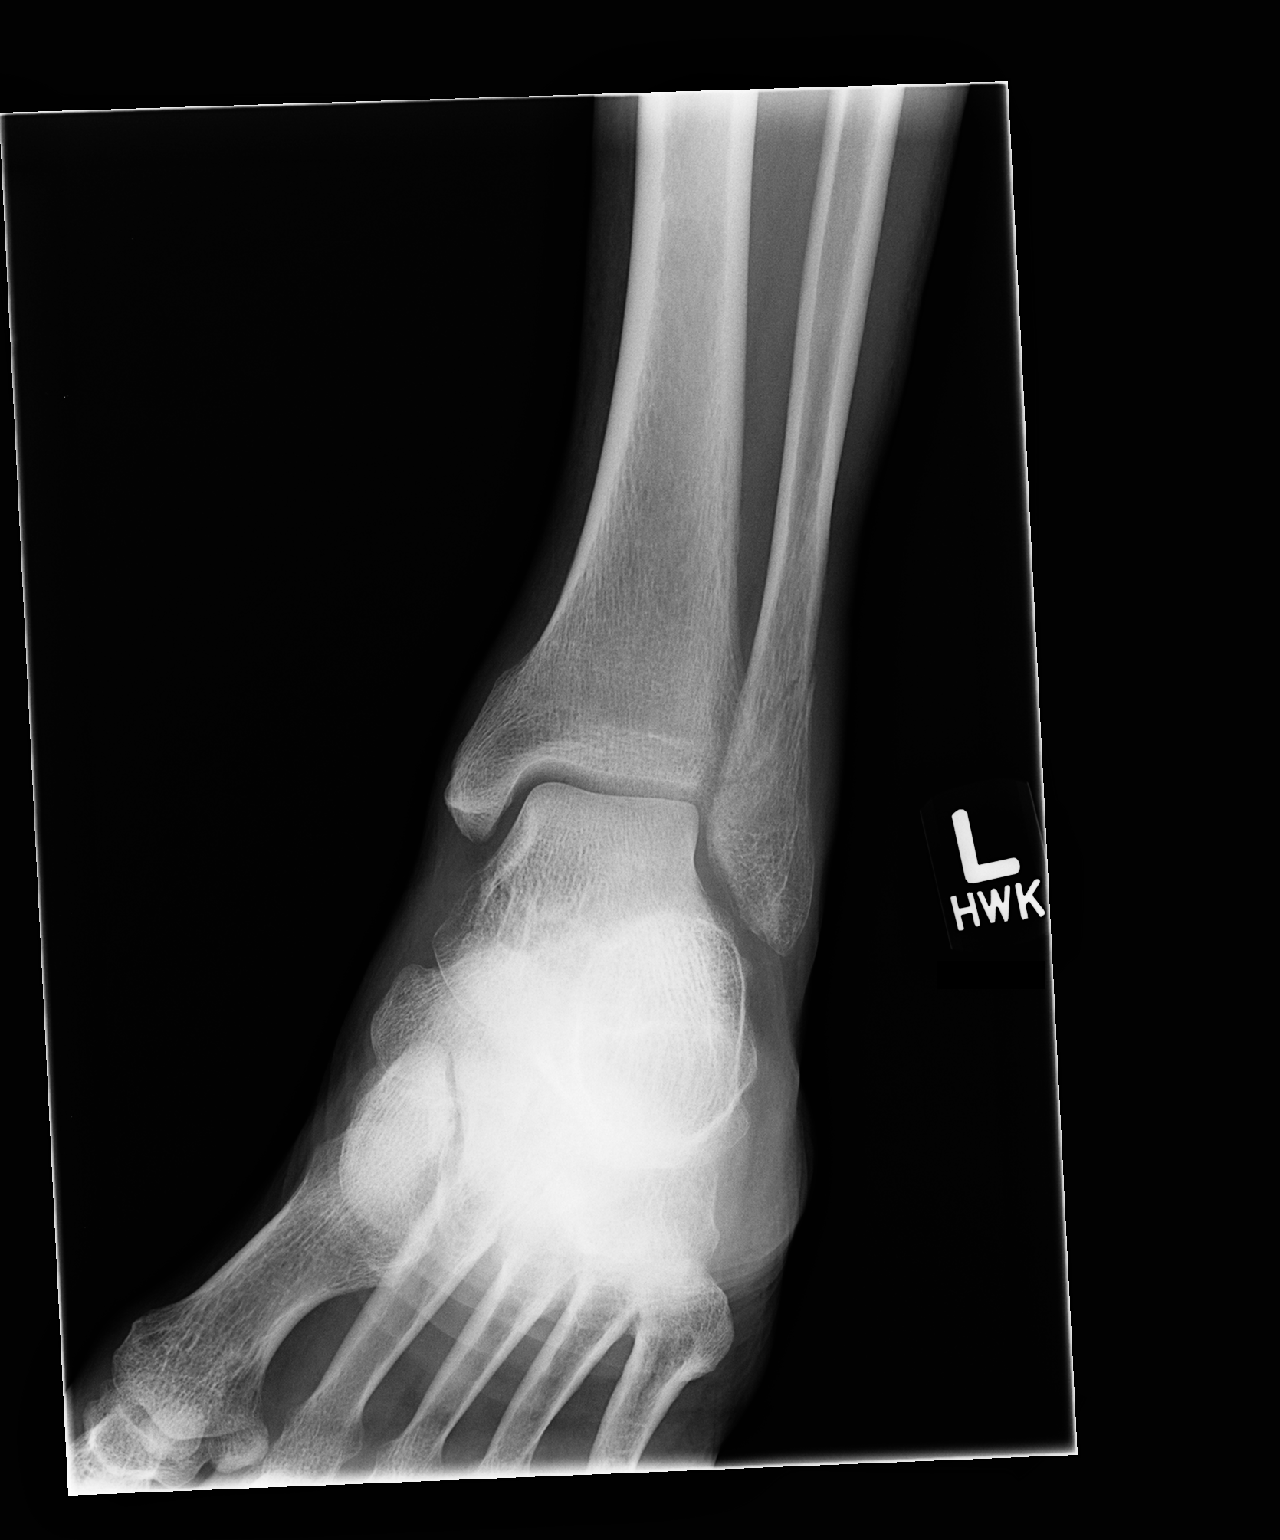

[view not recorded (3 of 3)]
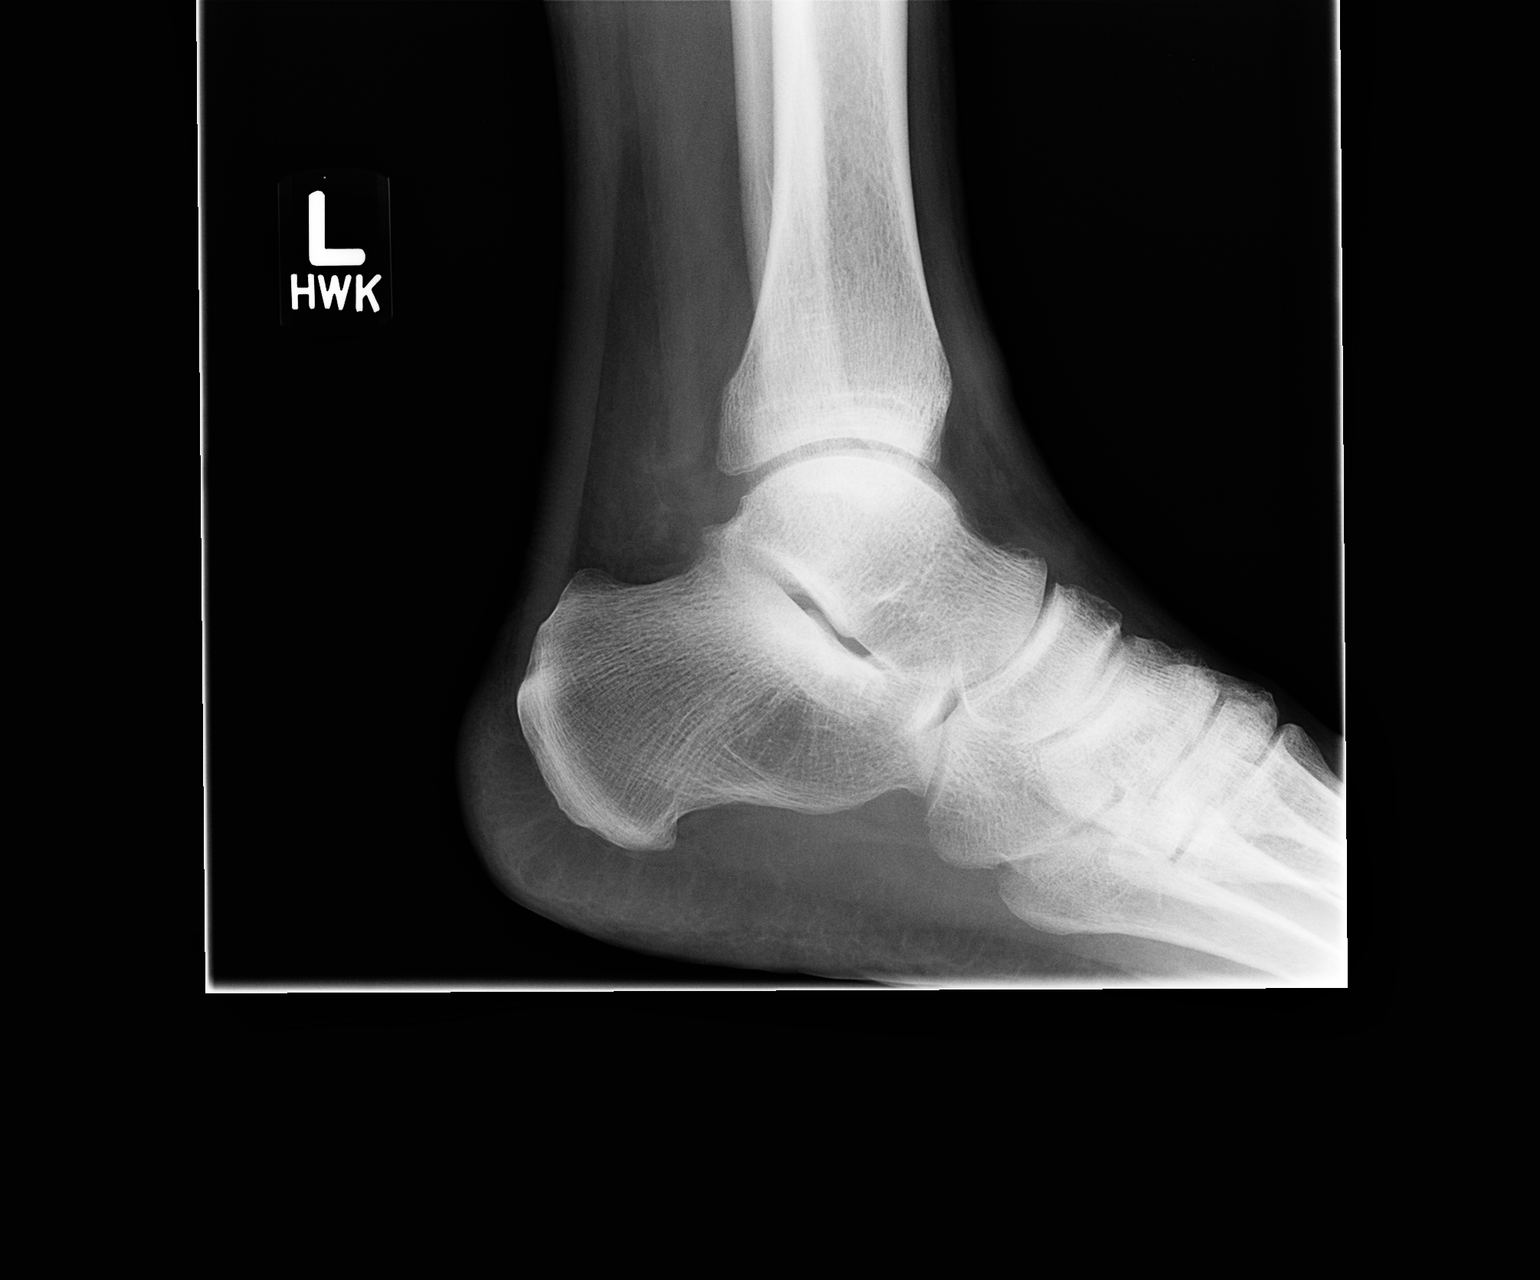

[3 of 3 positions shown; findings below may reference images not displayed]

FINDINGS: The oblique nondisplaced fracture of the distal fibula is less well
visualized, indicating some interval healing. No displacement is
seen. The ankle joint appears normal and alignment is normal.
IMPRESSION: Some interval healing of the oblique nondisplaced distal left
fibular fracture.

## 2015-07-07 ENCOUNTER — Telehealth: Payer: Self-pay | Admitting: Internal Medicine

## 2015-07-07 NOTE — Telephone Encounter (Signed)
Patient is stating that he is having bad sinuses and rumbling in ears and when he coughs it feels like his eye balls are going to pop out. Please follow up with pt. Thank you.

## 2015-07-10 NOTE — Telephone Encounter (Signed)
Patient called stating that he is having sinus pain and would like to speak to a nurse. Please f/u

## 2015-07-11 NOTE — Telephone Encounter (Signed)
Medical Assistant left message on patient's cell voicemail. Voicemail states to give a call back to Nubia with [facility] at [phone].  

## 2015-07-11 NOTE — Telephone Encounter (Signed)
Patient called back, please f/u with pt.  °

## 2015-07-14 NOTE — Telephone Encounter (Signed)
Patient verified DOB Patient was made aware of needing to schedule an appointment.  Patient will call Monday morning to get into [facility] to schedule appointment. Patient had no further questions.

## 2015-07-24 ENCOUNTER — Ambulatory Visit: Payer: Medicaid Other | Attending: Internal Medicine | Admitting: Internal Medicine

## 2015-07-24 ENCOUNTER — Encounter: Payer: Self-pay | Admitting: Internal Medicine

## 2015-07-24 VITALS — BP 130/88 | HR 66 | Temp 98.0°F | Resp 16 | Ht 66.0 in | Wt 181.0 lb

## 2015-07-24 DIAGNOSIS — J0110 Acute frontal sinusitis, unspecified: Secondary | ICD-10-CM | POA: Insufficient documentation

## 2015-07-24 DIAGNOSIS — F17210 Nicotine dependence, cigarettes, uncomplicated: Secondary | ICD-10-CM | POA: Insufficient documentation

## 2015-07-24 DIAGNOSIS — J329 Chronic sinusitis, unspecified: Secondary | ICD-10-CM | POA: Diagnosis present

## 2015-07-24 DIAGNOSIS — Z23 Encounter for immunization: Secondary | ICD-10-CM | POA: Diagnosis not present

## 2015-07-24 DIAGNOSIS — F17200 Nicotine dependence, unspecified, uncomplicated: Secondary | ICD-10-CM

## 2015-07-24 MED ORDER — FLUTICASONE PROPIONATE 50 MCG/ACT NA SUSP: 2.0000 | Freq: Every day | NASAL | Status: DC

## 2015-07-24 MED ORDER — AMOXICILLIN-POT CLAVULANATE 875-125 MG PO TABS: 1.0000 | ORAL_TABLET | Freq: Two times a day (BID) | ORAL | Status: DC

## 2015-07-24 NOTE — Patient Instructions (Addendum)
Please call back when Medicaid approved it is time for you to have a Colonoscopy   Smoking Cessation, Tips for Success If you are ready to quit smoking, congratulations! You have chosen to help yourself be healthier. Cigarettes bring nicotine, tar, carbon monoxide, and other irritants into your body. Your lungs, heart, and blood vessels will be able to work better without these poisons. There are many different ways to quit smoking. Nicotine gum, nicotine patches, a nicotine inhaler, or nicotine nasal spray can help with physical craving. Hypnosis, support groups, and medicines help break the habit of smoking. WHAT THINGS CAN I DO TO MAKE QUITTING EASIER?  Here are some tips to help you quit for good:  Pick a date when you will quit smoking completely. Tell all of your friends and family about your plan to quit on that date.  Do not try to slowly cut down on the number of cigarettes you are smoking. Pick a quit date and quit smoking completely starting on that day.  Throw away all cigarettes.   Clean and remove all ashtrays from your home, work, and car.  On a card, write down your reasons for quitting. Carry the card with you and read it when you get the urge to smoke.  Cleanse your body of nicotine. Drink enough water and fluids to keep your urine clear or pale yellow. Do this after quitting to flush the nicotine from your body.  Learn to predict your moods. Do not let a bad situation be your excuse to have a cigarette. Some situations in your life might tempt you into wanting a cigarette.  Never have "just one" cigarette. It leads to wanting another and another. Remind yourself of your decision to quit.  Change habits associated with smoking. If you smoked while driving or when feeling stressed, try other activities to replace smoking. Stand up when drinking your coffee. Brush your teeth after eating. Sit in a different chair when you read the paper. Avoid alcohol while trying to quit,  and try to drink fewer caffeinated beverages. Alcohol and caffeine may urge you to smoke.  Avoid foods and drinks that can trigger a desire to smoke, such as sugary or spicy foods and alcohol.  Ask people who smoke not to smoke around you.  Have something planned to do right after eating or having a cup of coffee. For example, plan to take a walk or exercise.  Try a relaxation exercise to calm you down and decrease your stress. Remember, you may be tense and nervous for the first 2 weeks after you quit, but this will pass.  Find new activities to keep your hands busy. Play with a pen, coin, or rubber band. Doodle or draw things on paper.  Brush your teeth right after eating. This will help cut down on the craving for the taste of tobacco after meals. You can also try mouthwash.   Use oral substitutes in place of cigarettes. Try using lemon drops, carrots, cinnamon sticks, or chewing gum. Keep them handy so they are available when you have the urge to smoke.  When you have the urge to smoke, try deep breathing.  Designate your home as a nonsmoking area.  If you are a heavy smoker, ask your health care provider about a prescription for nicotine chewing gum. It can ease your withdrawal from nicotine.  Reward yourself. Set aside the cigarette money you save and buy yourself something nice.  Look for support from others. Join a support group  or smoking cessation program. Ask someone at home or at work to help you with your plan to quit smoking.  Always ask yourself, "Do I need this cigarette or is this just a reflex?" Tell yourself, "Today, I choose not to smoke," or "I do not want to smoke." You are reminding yourself of your decision to quit.  Do not replace cigarette smoking with electronic cigarettes (commonly called e-cigarettes). The safety of e-cigarettes is unknown, and some may contain harmful chemicals.  If you relapse, do not give up! Plan ahead and think about what you will do  the next time you get the urge to smoke. HOW WILL I FEEL WHEN I QUIT SMOKING? You may have symptoms of withdrawal because your body is used to nicotine (the addictive substance in cigarettes). You may crave cigarettes, be irritable, feel very hungry, cough often, get headaches, or have difficulty concentrating. The withdrawal symptoms are only temporary. They are strongest when you first quit but will go away within 10-14 days. When withdrawal symptoms occur, stay in control. Think about your reasons for quitting. Remind yourself that these are signs that your body is healing and getting used to being without cigarettes. Remember that withdrawal symptoms are easier to treat than the major diseases that smoking can cause.  Even after the withdrawal is over, expect periodic urges to smoke. However, these cravings are generally short lived and will go away whether you smoke or not. Do not smoke! WHAT RESOURCES ARE AVAILABLE TO HELP ME QUIT SMOKING? Your health care provider can direct you to community resources or hospitals for support, which may include:  Group support.  Education.  Hypnosis.  Therapy.   This information is not intended to replace advice given to you by your health care provider. Make sure you discuss any questions you have with your health care provider.   Document Released: 06/14/2004 Document Revised: 10/07/2014 Document Reviewed: 03/04/2013 Elsevier Interactive Patient Education [employer]2016 [employer] Inc.

## 2015-07-24 NOTE — Progress Notes (Signed)
Patient complains of having cough congestion and some pressure behind his left eye Patient states his symptoms started about two months ago Has been using OTC medications with no relief

## 2015-07-24 NOTE — Progress Notes (Signed)
Patient ID: Erik Ward, male   DOB: 20-Jun-1956, 59 y.o.   MRN: [id number]   Erik Ward, is a 59 y.o. male  [id number]  [id number]  DOB - 20-Jun-1956  CC:  Chief Complaint  Patient presents with  . Sinusitis       HPI: Erik Ward is a 59 y.o. male here today to with complaints of head congestion, cough and pain behind left eye when he coughs. Patient has significant medical history of subarachnoid hemorhage in 2013. Patient states symptoms has been ongoing for last two months and has had only minimal relief with otc decongestants. Patient rate pain behind right eye as 8 of 10 when he coughs. Patient states cough is non-productive. Patient is cigarette smoker 1/2 pack per day for last 40 years.  Patient has No chest pain, No abdominal pain - No Nausea, No new weakness tingling or numbness, no SOB  No Known Allergies Past Medical History  Diagnosis Date  . Syncope and collapse 09/18/2012    w/loss of consciousness (09/18/2012)  . Subarachnoid hemorrhage following injury, with loss of consciousness (HCC) 09/18/2012    syncope and collapse (09/18/2012)   Current Outpatient Prescriptions on File Prior to Visit  Medication Sig Dispense Refill  . naproxen sodium (ANAPROX) 220 MG tablet Take 220 mg by mouth 2 (two) times daily as needed (pain).    . traMADol (ULTRAM) 50 MG tablet Take 1 tablet (50 mg total) by mouth every 6 (six) hours as needed. 15 tablet 0  . Vitamin D, Ergocalciferol, (DRISDOL) 50000 UNITS CAPS capsule Take 1 capsule (50,000 Units total) by mouth every 7 (seven) days. 10 capsule 2   No current facility-administered medications on file prior to visit.   History reviewed. No pertinent family history. Social History   Social History  . Marital Status: Married    Spouse Name: N/A  . Number of Children: N/A  . Years of Education: N/A   Occupational History  . Not on file.   Social History Main Topics  . Smoking status: Current Every Day Smoker  -- 0.50 packs/day for 40 years    Types: Cigarettes  . Smokeless tobacco: Never Used     Comment: 09/18/2012 offered smoking cessation materials; pt declines  . Alcohol Use: No  . Drug Use: No  . Sexual Activity: Not Currently   Other Topics Concern  . Not on file   Social History Narrative    Review of Systems  Constitutional: Negative.   HENT: Positive for congestion.   Eyes: Positive for pain.       Left eye pain and pressure  Respiratory: Negative.   Cardiovascular: Negative.   Gastrointestinal: Negative.   Genitourinary: Negative.   Musculoskeletal: Negative.   Skin: Negative.   Neurological: Negative.   Endo/Heme/Allergies: Negative.   Psychiatric/Behavioral: Negative.       Objective:   Filed Vitals:   07/24/15 1114  BP: 130/88  Pulse: 66  Temp: 98 F (36.7 C)  Resp: 16    Physical Exam  Constitutional: He is oriented to person, place, and time. He appears well-developed and well-nourished.  HENT:  Head: Normocephalic and atraumatic.  Right Ear: External ear normal.  Left Ear: External ear normal.  Nose: Nose normal.  Mouth/Throat: Oropharynx is clear and moist.  Right ear effusion  Eyes: Conjunctivae and EOM are normal. Pupils are equal, round, and reactive to light.  Neck: Normal range of motion. Neck supple.  Cardiovascular: Normal rate, regular rhythm, normal heart sounds and intact distal  pulses.   Pulmonary/Chest: Effort normal and breath sounds normal.  Abdominal: Soft. Bowel sounds are normal.  Musculoskeletal: Normal range of motion.  Neurological: He is alert and oriented to person, place, and time. He has normal reflexes.  Skin: Skin is warm and dry.  Psychiatric: He has a normal mood and affect. His behavior is normal. Judgment and thought content normal.  Nursing note and vitals reviewed.    Lab Results  Component Value Date   WBC 5.6 10/29/2013   HGB 16.6 10/29/2013   HCT 48.7 10/29/2013   MCV 90.9 10/29/2013   PLT 300  10/29/2013   Lab Results  Component Value Date   CREATININE 1.00 10/29/2013   BUN 14 10/29/2013   NA 137 10/29/2013   K 4.3 10/29/2013   CL 104 10/29/2013   CO2 26 10/29/2013    Lab Results  Component Value Date   HGBA1C 6.3* 09/18/2012   Lipid Panel     Component Value Date/Time   CHOL 181 10/29/2013 1628   TRIG 194* 10/29/2013 1628   HDL 37* 10/29/2013 1628   CHOLHDL 4.9 10/29/2013 1628   VLDL 39 10/29/2013 1628   LDLCALC 105* 10/29/2013 1628       Assessment and plan:  Erik Ward was seen today for sinusitis.  Diagnoses and all orders for this visit:  Acute frontal sinusitis, recurrence not specified -     amoxicillin-clavulanate (AUGMENTIN) 875-125 MG tablet; Take 1 tablet by mouth 2 (two) times daily. -     fluticasone (FLONASE) 50 MCG/ACT nasal spray; Place 2 sprays into both nostrils daily.  Tobacco use disorder       Patient was counseled about the importance of smoking cessation and agrees to goal of cutting down to 5 cigarettes a day by 08/2015. Patient is not ready to quit smoking at this time.   Health care maintenance -     Flu Vaccine QUAD 36+ mos PF IM (Fluarix & Fluzone Quad PF)   The patient was given clear instructions to go to ER or return to medical center if symptoms don't improve, worsen or new problems develop. The patient verbalized understanding.   Return if symptoms worsen or fail to improve.   Stephanie Coup, RN, BSN, AGNP - APP Student [facility] [phone] 07/24/2015, 11:53 AM

## 2015-12-13 ENCOUNTER — Encounter: Payer: Self-pay | Admitting: Internal Medicine

## 2015-12-13 ENCOUNTER — Ambulatory Visit: Payer: Medicaid Other | Attending: Internal Medicine | Admitting: Internal Medicine

## 2015-12-13 VITALS — BP 127/85 | HR 68 | Temp 98.0°F | Resp 16 | Ht 66.0 in | Wt 180.0 lb

## 2015-12-13 DIAGNOSIS — J0110 Acute frontal sinusitis, unspecified: Secondary | ICD-10-CM

## 2015-12-13 DIAGNOSIS — F17210 Nicotine dependence, cigarettes, uncomplicated: Secondary | ICD-10-CM | POA: Diagnosis not present

## 2015-12-13 DIAGNOSIS — F17200 Nicotine dependence, unspecified, uncomplicated: Secondary | ICD-10-CM

## 2015-12-13 DIAGNOSIS — J329 Chronic sinusitis, unspecified: Secondary | ICD-10-CM | POA: Insufficient documentation

## 2015-12-13 DIAGNOSIS — Z720 Tobacco use: Secondary | ICD-10-CM | POA: Insufficient documentation

## 2015-12-13 DIAGNOSIS — Z79899 Other long term (current) drug therapy: Secondary | ICD-10-CM | POA: Insufficient documentation

## 2015-12-13 DIAGNOSIS — M6588 Other synovitis and tenosynovitis, other site: Secondary | ICD-10-CM

## 2015-12-13 DIAGNOSIS — M659 Synovitis and tenosynovitis, unspecified: Secondary | ICD-10-CM

## 2015-12-13 MED ORDER — FLUTICASONE PROPIONATE 50 MCG/ACT NA SUSP: 2.0000 | Freq: Every day | NASAL | Status: DC

## 2015-12-13 MED ORDER — AMOXICILLIN-POT CLAVULANATE 875-125 MG PO TABS: 1.0000 | ORAL_TABLET | Freq: Two times a day (BID) | ORAL | Status: DC

## 2015-12-13 MED ORDER — MELOXICAM 15 MG PO TABS: 15.0000 mg | ORAL_TABLET | Freq: Every day | ORAL | Status: DC

## 2015-12-13 NOTE — Progress Notes (Signed)
Patient complains of having a lot of sinus pressure,cough Patient also stated when he coughs it feels like his eye ball are going to  "pop" out Patient also complaining of not being able to bend his left thumb

## 2015-12-13 NOTE — Progress Notes (Signed)
Patient ID: Erik Ward, male   DOB: 1955-10-22, 60 y.o.   MRN: [id number]  CC:  Facial pressure and thumb stiffness  HPI: Erik Ward is a 60 y.o. male here today for facial pressure and left thumb stiffness.  Patient has past medical history of subarachnoid hemorrhage and tobacco abuse.  Patient states he has been having pressure between and behind his eyes for the past 3 weeks.  Patient states, "I feel like my eyeballs about to pop out."  Patient states he previously had a runny nose, but not anymore.  Patient reports a cough that is worse at night with white secretions. Denies watery eyes. Patient denies any CP, SOB, nausea, vomiting, fever, chills and sweats.  Patient reports waking up and not being able to move his left thumb. Pain is exacerbated by attempting to bend left thumb. He denies any wrist pain or lose of grip. Patient states there is only pain when he tries to bend it.  Patient denies any injury.  Patient states it doesn't affect his daily life.   No Known Allergies Past Medical History  Diagnosis Date  . Syncope and collapse 09/18/2012    w/loss of consciousness (09/18/2012)  . Subarachnoid hemorrhage following injury, with loss of consciousness (HCC) 09/18/2012    syncope and collapse (09/18/2012)   Current Outpatient Prescriptions on File Prior to Visit  Medication Sig Dispense Refill  . traMADol (ULTRAM) 50 MG tablet Take 1 tablet (50 mg total) by mouth every 6 (six) hours as needed. 15 tablet 0  . Vitamin D, Ergocalciferol, (DRISDOL) 50000 UNITS CAPS capsule Take 1 capsule (50,000 Units total) by mouth every 7 (seven) days. 10 capsule 2   No current facility-administered medications on file prior to visit.   History reviewed. No pertinent family history. Social History   Social History  . Marital Status: Married    Spouse Name: N/A  . Number of Children: N/A  . Years of Education: N/A   Occupational History  . Not on file.   Social History Main Topics  .  Smoking status: Current Every Day Smoker -- 0.50 packs/day for 40 years    Types: Cigarettes  . Smokeless tobacco: Never Used     Comment: 09/18/2012 offered smoking cessation materials; pt declines  . Alcohol Use: No  . Drug Use: No  . Sexual Activity: Not Currently   Other Topics Concern  . Not on file   Social History Narrative    Review of Systems: Constitutional: Negative for fever, chills, diaphoresis, activity change, appetite change and fatigue. HENT: Nasal congestion Negative for ear pain, nosebleeds, facial swelling, rhinorrhea, neck pain, neck stiffness and ear discharge.  Eyes: Negative for pain, discharge, redness, itching and visual disturbance. Respiratory: Cough Negative for choking, chest tightness, shortness of breath, wheezing and stridor.  Cardiovascular: Negative for chest pain, palpitations and leg swelling. Musculoskeletal: Left thumb pain/stiffness Negative for back pain, joint swelling, arthralgias and gait problem. Neurological: Negative for dizziness, tremors, seizures, syncope, facial asymmetry, speech difficulty, weakness, light-headedness, numbness and headaches.   Objective:   Filed Vitals:   12/13/15 1516  BP: 127/85  Pulse: 68  Temp: 98 F (36.7 C)  Resp: 16    Physical Exam: Constitutional: Patient appears well-developed and well-nourished. No distress. HENT: Normocephalic, atraumatic, External right and left ear normal. Oropharynx is clear and moist. No sinus tenderness, edema and erythema of nasal mucosa. Bilateral nasal turbinate swelling. Eyes: Conjunctivae and EOM are normal.  Neck: Normal ROM. Neck supple. No JVD. No  tracheal deviation. No thyromegaly. CVS: RRR, S1/S2 +, no murmurs, no gallops, no carotid bruit.  Pulmonary: Effort and breath sounds normal, no stridor, rhonchi, wheezes, rales.  Musculoskeletal: Left thumb stiffness Normal range of motion. No edema and no tenderness.  Lymphadenopathy: No lymphadenopathy noted,  cervical Neuro: Alert.  Skin: Skin is warm and dry. No rash noted. Not diaphoretic. No erythema. No pallor.   Lab Results  Component Value Date   WBC 5.6 10/29/2013   HGB 16.6 10/29/2013   HCT 48.7 10/29/2013   MCV 90.9 10/29/2013   PLT 300 10/29/2013   Lab Results  Component Value Date   CREATININE 1.00 10/29/2013   BUN 14 10/29/2013   NA 137 10/29/2013   K 4.3 10/29/2013   CL 104 10/29/2013   CO2 26 10/29/2013    Lab Results  Component Value Date   HGBA1C 6.3* 09/18/2012   Lipid Panel     Component Value Date/Time   CHOL 181 10/29/2013 1628   TRIG 194* 10/29/2013 1628   HDL 37* 10/29/2013 1628   CHOLHDL 4.9 10/29/2013 1628   VLDL 39 10/29/2013 1628   LDLCALC 105* 10/29/2013 1628       Assessment and plan:   Erik Ward was seen today for sinusitis.  Diagnoses and all orders for this visit:  Acute frontal sinusitis, recurrence not specified -     fluticasone (FLONASE) 50 MCG/ACT nasal spray; Place 2 sprays into both nostrils daily. -     amoxicillin-clavulanate (AUGMENTIN) 875-125 MG tablet; Take 1 tablet by mouth 2 (two) times daily. Patient to take medication and return if symptoms worsen.  Patient encouraged to stay hydrated and use humidifier.  Flexor tenosynovitis of thumb -     meloxicam (MOBIC) 15 MG tablet; Take 1 tablet (15 mg total) by mouth daily. Finger inflammation Patient to take medication as ordered and use ice.  Tobacco use disorder Smoking cessation discussed for 3 minutes, patient is not willing to quit at this time. Will continue to assess on each visit. Discussed increased risk for diseases such as cancer, heart disease, and stroke.   Return if symptoms worsen or fail to improve.      Erik HatcherErica Georg Ang, NP-C [facility] [phone] 12/13/2015, 4:10 PM

## 2015-12-14 MED FILL — MELOXICAM 15 MG TABLET: 15 | 30 days supply | Qty: 30 | Fill #0

## 2015-12-14 MED FILL — AMOX-CLAV 875-125 MG TABLET: 875-125 | 7 days supply | Qty: 14 | Fill #0

## 2015-12-14 MED FILL — FLUTICASONE PROP 50 MCG SPR: 50 | 30 days supply | Qty: 16 | Fill #0

## 2016-07-31 ENCOUNTER — Encounter: Payer: Self-pay | Admitting: Internal Medicine

## 2016-07-31 ENCOUNTER — Ambulatory Visit: Payer: Self-pay | Attending: Internal Medicine | Admitting: Internal Medicine

## 2016-07-31 ENCOUNTER — Telehealth: Payer: Self-pay | Admitting: Internal Medicine

## 2016-07-31 VITALS — BP 117/75 | HR 80 | Temp 98.0°F | Resp 18 | Ht 66.0 in | Wt 167.0 lb

## 2016-07-31 DIAGNOSIS — E559 Vitamin D deficiency, unspecified: Secondary | ICD-10-CM | POA: Insufficient documentation

## 2016-07-31 DIAGNOSIS — R7303 Prediabetes: Secondary | ICD-10-CM | POA: Insufficient documentation

## 2016-07-31 DIAGNOSIS — Z0000 Encounter for general adult medical examination without abnormal findings: Secondary | ICD-10-CM

## 2016-07-31 DIAGNOSIS — J0110 Acute frontal sinusitis, unspecified: Secondary | ICD-10-CM

## 2016-07-31 DIAGNOSIS — Z23 Encounter for immunization: Secondary | ICD-10-CM | POA: Insufficient documentation

## 2016-07-31 DIAGNOSIS — Z720 Tobacco use: Secondary | ICD-10-CM

## 2016-07-31 DIAGNOSIS — Z1159 Encounter for screening for other viral diseases: Secondary | ICD-10-CM

## 2016-07-31 DIAGNOSIS — Z125 Encounter for screening for malignant neoplasm of prostate: Secondary | ICD-10-CM

## 2016-07-31 DIAGNOSIS — Z114 Encounter for screening for human immunodeficiency virus [HIV]: Secondary | ICD-10-CM

## 2016-07-31 DIAGNOSIS — J309 Allergic rhinitis, unspecified: Secondary | ICD-10-CM | POA: Insufficient documentation

## 2016-07-31 DIAGNOSIS — F17210 Nicotine dependence, cigarettes, uncomplicated: Secondary | ICD-10-CM | POA: Insufficient documentation

## 2016-07-31 DIAGNOSIS — J3489 Other specified disorders of nose and nasal sinuses: Secondary | ICD-10-CM | POA: Insufficient documentation

## 2016-07-31 LAB — CMP AND LIVER
ALT: 8 U/L — AB (ref 9–46)
AST: 13 U/L (ref 10–35)
Albumin: 4.4 g/dL (ref 3.6–5.1)
Alkaline Phosphatase: 60 U/L (ref 40–115)
BUN: 11 mg/dL (ref 7–25)
Bilirubin, Direct: 0.1 mg/dL (ref <=0.2)
CHLORIDE: 103 mmol/L (ref 98–110)
CO2: 27 mmol/L (ref 20–31)
Calcium: 9.6 mg/dL (ref 8.6–10.3)
Creat: 1.03 mg/dL (ref 0.70–1.25)
Glucose, Bld: 96 mg/dL (ref 65–99)
Indirect Bilirubin: 0.5 mg/dL (ref 0.2–1.2)
Potassium: 4.4 mmol/L (ref 3.5–5.3)
Sodium: 137 mmol/L (ref 135–146)
Total Bilirubin: 0.6 mg/dL (ref 0.2–1.2)
Total Protein: 7.5 g/dL (ref 6.1–8.1)

## 2016-07-31 LAB — LIPID PANEL
CHOLESTEROL: 193 mg/dL (ref 125–200)
HDL: 42 mg/dL (ref >=40)
LDL Cholesterol: 120 mg/dL (ref <130)
Total CHOL/HDL Ratio: 4.6 Ratio (ref <=5.0)
Triglycerides: 155 mg/dL — ABNORMAL HIGH (ref <150)
VLDL: 31 mg/dL — AB (ref <30)

## 2016-07-31 LAB — POCT GLYCOSYLATED HEMOGLOBIN (HGB A1C): HEMOGLOBIN A1C: 5.9

## 2016-07-31 LAB — PSA: PSA: 0.5 ng/mL (ref <=4.0)

## 2016-07-31 LAB — GLUCOSE, POCT (MANUAL RESULT ENTRY): POC Glucose: 125 mg/dl — AB (ref 70–99)

## 2016-07-31 MED ORDER — FLUTICASONE PROPIONATE 50 MCG/ACT NA SUSP: 2.0000 | Freq: Every day | NASAL | 1 refills | Status: DC

## 2016-07-31 MED FILL — FLUTICASONE PROP 50 MCG SPR: 50 | 30 days supply | Qty: 16 | Fill #0

## 2016-07-31 NOTE — Telephone Encounter (Signed)
Patient would like to report that he's not longer taking: Meloxicam, Tramadol Amoxiciillin Vitamin d   Patient is only on Flonase   Thank you

## 2016-07-31 NOTE — Telephone Encounter (Signed)
Updated in pt chart from today will forward to pcp

## 2016-07-31 NOTE — Patient Instructions (Addendum)
-financial aid packet/appt.   Pneumococcal Polysaccharide Vaccine: What You Need to Know 1. Why get vaccinated? Vaccination can protect older adults (and some children and younger adults) from pneumococcal disease. Pneumococcal disease is caused by bacteria that can spread from person to person through close contact. It can cause ear infections, and it can also lead to more serious infections of the:   Lungs (pneumonia),  Blood (bacteremia), and  Covering of the brain and spinal cord (meningitis). Meningitis can cause deafness and brain damage, and it can be fatal. Anyone can get pneumococcal disease, but children under 492 years of age, people with certain medical conditions, adults over 61 years of age, and cigarette smokers are at the highest risk. About 18,000 older adults die each year from pneumococcal disease in the [place]. Treatment of pneumococcal infections with penicillin and other drugs used to be more effective. But some strains of the disease have become resistant to these drugs. This makes prevention of the disease, through vaccination, even more important. 2. Pneumococcal polysaccharide vaccine (PPSV23) Pneumococcal polysaccharide vaccine (PPSV23) protects against 23 types of pneumococcal bacteria. It will not prevent all pneumococcal disease. PPSV23 is recommended for:  All adults 61 years of age and older,  Anyone 2 through 61 years of age with certain long-term health problems,  Anyone 2 through 61 years of age with a weakened immune system,  Adults 8019 through 61 years of age who smoke cigarettes or have asthma. Most people need only one dose of PPSV. A second dose is recommended for certain high-risk groups. People 7265 and older should get a dose even if they have gotten one or more doses of the vaccine before they turned 65. Your healthcare provider can give you more information about these recommendations. Most healthy adults develop protection within 2 to 3  weeks of getting the shot. 3. Some people should not get this vaccine  Anyone who has had a life-threatening allergic reaction to PPSV should not get another dose.  Anyone who has a severe allergy to any component of PPSV should not receive it. Tell your provider if you have any severe allergies.  Anyone who is moderately or severely ill when the shot is scheduled may be asked to wait until they recover before getting the vaccine. Someone with a mild illness can usually be vaccinated.  Children less than 272 years of age should not receive this vaccine.  There is no evidence that PPSV is harmful to either a pregnant woman or to her fetus. However, as a precaution, women who need the vaccine should be vaccinated before becoming pregnant, if possible. 4. Risks of a vaccine reaction With any medicine, including vaccines, there is a chance of side effects. These are usually mild and go away on their own, but serious reactions are also possible. About half of people who get PPSV have mild side effects, such as redness or pain where the shot is given, which go away within about two days. Less than 1 out of 100 people develop a fever, muscle aches, or more severe local reactions. Problems that could happen after any vaccine:  People sometimes faint after a medical procedure, including vaccination. Sitting or lying down for about 15 minutes can help prevent fainting, and injuries caused by a fall. Tell your doctor if you feel dizzy, or have vision changes or ringing in the ears.  Some people get severe pain in the shoulder and have difficulty moving the arm where a shot was given. This happens  very rarely.  Any medication can cause a severe allergic reaction. Such reactions from a vaccine are very rare, estimated at about 1 in a million doses, and would happen within a few minutes to a few hours after the vaccination. As with any medicine, there is a very remote chance of a vaccine causing a serious  injury or death. The safety of vaccines is always being monitored. For more information, visit: http://floyd.org/ 5. What if there is a serious reaction? What should I look for? Look for anything that concerns you, such as signs of a severe allergic reaction, very high fever, or unusual behavior.  Signs of a severe allergic reaction can include hives, swelling of the face and throat, difficulty breathing, a fast heartbeat, dizziness, and weakness. These would usually start a few minutes to a few hours after the vaccination. What should I do? If you think it is a severe allergic reaction or other emergency that can't wait, call 9-1-1 or get to the nearest hospital. Otherwise, call your doctor. Afterward, the reaction should be reported to the Vaccine Adverse Event Reporting System (VAERS). Your doctor might file this report, or you can do it yourself through the VAERS web site at www.vaers.LAgents.no, or by calling 1-360 645 5653.  VAERS does not give medical advice. 6. How can I learn more?  Ask your doctor. He or she can give you the vaccine package insert or suggest other sources of information.  Call your local or state health department.  Contact the Centers for Disease Control and Prevention (CDC):  Call (850)479-2159 (1-800-CDC-INFO) or  Visit CDC's website at PicCapture.uy CDC Pneumococcal Polysaccharide Vaccine VIS (01/21/14)   This information is not intended to replace advice given to you by your health care provider. Make sure you discuss any questions you have with your health care provider.   Document Released: 07/14/2006 Document Revised: 10/07/2014 Document Reviewed: 01/24/2014 Elsevier Interactive Patient Education 2016 Elsevier Inc. Influenza Virus Vaccine injection (Fluarix) What is this medicine? INFLUENZA VIRUS VACCINE (in floo EN zuh VAHY ruhs vak SEEN) helps to reduce the risk of getting influenza also known as the flu. This medicine may be used for  other purposes; ask your health care provider or pharmacist if you have questions. What should I tell my health care provider before I take this medicine? They need to know if you have any of these conditions: -bleeding disorder like hemophilia -fever or infection -Guillain-Barre syndrome or other neurological problems -immune system problems -infection with the human immunodeficiency virus (HIV) or AIDS -low blood platelet counts -multiple sclerosis -an unusual or allergic reaction to influenza virus vaccine, eggs, chicken proteins, latex, gentamicin, other medicines, foods, dyes or preservatives -pregnant or trying to get pregnant -breast-feeding How should I use this medicine? This vaccine is for injection into a muscle. It is given by a health care professional. A copy of Vaccine Information Statements will be given before each vaccination. Read this sheet carefully each time. The sheet may change frequently. Talk to your pediatrician regarding the use of this medicine in children. Special care may be needed. Overdosage: If you think you have taken too much of this medicine contact a poison control center or emergency room at once. NOTE: This medicine is only for you. Do not share this medicine with others. What if I miss a dose? This does not apply. What may interact with this medicine? -chemotherapy or radiation therapy -medicines that lower your immune system like etanercept, anakinra, infliximab, and adalimumab -medicines that treat or prevent  blood clots like warfarin -phenytoin -steroid medicines like prednisone or cortisone -theophylline -vaccines This list may not describe all possible interactions. Give your health care provider a list of all the medicines, herbs, non-prescription drugs, or dietary supplements you use. Also tell them if you smoke, drink alcohol, or use illegal drugs. Some items may interact with your medicine. What should I watch for while using this  medicine? Report any side effects that do not go away within 3 days to your doctor or health care professional. Call your health care provider if any unusual symptoms occur within 6 weeks of receiving this vaccine. You may still catch the flu, but the illness is not usually as bad. You cannot get the flu from the vaccine. The vaccine will not protect against colds or other illnesses that may cause fever. The vaccine is needed every year. What side effects may I notice from receiving this medicine? Side effects that you should report to your doctor or health care professional as soon as possible: -allergic reactions like skin rash, itching or hives, swelling of the face, lips, or tongue Side effects that usually do not require medical attention (report to your doctor or health care professional if they continue or are bothersome): -fever -headache -muscle aches and pains -pain, tenderness, redness, or swelling at site where injected -weak or tired This list may not describe all possible side effects. Call your doctor for medical advice about side effects. You may report side effects to FDA at 1-800-FDA-1088. Where should I keep my medicine? This vaccine is only given in a clinic, pharmacy, doctor's office, or other health care setting and will not be stored at home. NOTE: This sheet is a summary. It may not cover all possible information. If you have questions about this medicine, talk to your doctor, pharmacist, or health care provider.    2016, Elsevier/Gold Standard. (2008-04-13 09:30:40)   -  You Can Quit Smoking If you are ready to quit smoking or are thinking about it, congratulations! You have chosen to help yourself be healthier and live longer! There are lots of different ways to quit smoking. Nicotine gum, nicotine patches, a nicotine inhaler, or nicotine nasal spray can help with physical craving. Hypnosis, support groups, and medicines help break the habit of smoking. TIPS TO GET  OFF AND STAY OFF CIGARETTES  Learn to predict your moods. Do not let a bad situation be your excuse to have a cigarette. Some situations in your life might tempt you to have a cigarette.  Ask friends and co-workers not to smoke around you.  Make your home smoke-free.  Never have "just one" cigarette. It leads to wanting another and another. Remind yourself of your decision to quit.  On a card, make a list of your reasons for not smoking. Read it at least the same number of times a day as you have a cigarette. Tell yourself everyday, "I do not want to smoke. I choose not to smoke."  Ask someone at home or work to help you with your plan to quit smoking.  Have something planned after you eat or have a cup of coffee. Take a walk or get other exercise to perk you up. This will help to keep you from overeating.  Try a relaxation exercise to calm you down and decrease your stress. Remember, you may be tense and nervous the first two weeks after you quit. This will pass.  Find new activities to keep your hands busy. Play with a pen,  coin, or rubber band. Doodle or draw things on paper.  Brush your teeth right after eating. This will help cut down the craving for the taste of tobacco after meals. You can try mouthwash too.  Try gum, breath mints, or diet candy to keep something in your mouth. IF YOU SMOKE AND WANT TO QUIT:  Do not stock up on cigarettes. Never buy a carton. Wait until one pack is finished before you buy another.  Never carry cigarettes with you at work or at home.  Keep cigarettes as far away from you as possible. Leave them with someone else.  Never carry matches or a lighter with you.  Ask yourself, "Do I need this cigarette or is this just a reflex?"  Bet with someone that you can quit. Put cigarette money in a piggy bank every morning. If you smoke, you give up the money. If you do not smoke, by the end of the week, you keep the money.  Keep trying. It takes 21 days  to change a habit!  Talk to your doctor about using medicines to help you quit. These include nicotine replacement gum, lozenges, or skin patches.   This information is not intended to replace advice given to you by your health care provider. Make sure you discuss any questions you have with your health care provider.   Document Released: 07/13/2009 Document Revised: 12/09/2011 Document Reviewed: 07/13/2009 Elsevier Interactive Patient Education 2016 [employer]   - Health Maintenance, Male A healthy lifestyle and preventative care can promote health and wellness.  Maintain regular health, dental, and eye exams.  Eat a healthy diet. Foods like vegetables, fruits, whole grains, low-fat dairy products, and lean protein foods contain the nutrients you need and are low in calories. Decrease your intake of foods high in solid fats, added sugars, and salt. Get information about a proper diet from your health care provider, if necessary.  Regular physical exercise is one of the most important things you can do for your health. Most adults should get at least 150 minutes of moderate-intensity exercise (any activity that increases your heart rate and causes you to sweat) each week. In addition, most adults need muscle-strengthening exercises on 2 or more days a week.   Maintain a healthy weight. The body mass index (BMI) is a screening tool to identify possible weight problems. It provides an estimate of body fat based on height and weight. Your health care provider can find your BMI and can help you achieve or maintain a healthy weight. For males 20 years and older:  A BMI below 18.5 is considered underweight.  A BMI of 18.5 to 24.9 is normal.  A BMI of 25 to 29.9 is considered overweight.  A BMI of 30 and above is considered obese.  Maintain normal blood lipids and cholesterol by exercising and minimizing your intake of saturated fat. Eat a balanced diet with plenty of fruits and  vegetables. Blood tests for lipids and cholesterol should begin at age 61 and be repeated every 5 years. If your lipid or cholesterol levels are high, you are over age 61, or you are at high risk for heart disease, you may need your cholesterol levels checked more frequently.Ongoing high lipid and cholesterol levels should be treated with medicines if diet and exercise are not working.  If you smoke, find out from your health care provider how to quit. If you do not use tobacco, do not start.  Lung cancer screening is recommended for adults  aged 55-80 years who are at high risk for developing lung cancer because of a history of smoking. A yearly low-dose CT scan of the lungs is recommended for people who have at least a 30-pack-year history of smoking and are current smokers or have quit within the past 15 years. A pack year of smoking is smoking an average of 1 pack of cigarettes a day for 1 year (for example, a 30-pack-year history of smoking could mean smoking 1 pack a day for 30 years or 2 packs a day for 15 years). Yearly screening should continue until the smoker has stopped smoking for at least 15 years. Yearly screening should be stopped for people who develop a health problem that would prevent them from having lung cancer treatment.  If you choose to drink alcohol, do not have more than 2 drinks per day. One drink is considered to be 12 oz (360 mL) of beer, 5 oz (150 mL) of wine, or 1.5 oz (45 mL) of liquor.  Avoid the use of street drugs. Do not share needles with anyone. Ask for help if you need support or instructions about stopping the use of drugs.  High blood pressure causes heart disease and increases the risk of stroke. High blood pressure is more likely to develop in:  People who have blood pressure in the end of the normal range (100-139/85-89 mm Hg).  People who are overweight or obese.  People who are African American.  If you are 62-35 years of age, have your blood pressure  checked every 3-5 years. If you are 85 years of age or older, have your blood pressure checked every year. You should have your blood pressure measured twice--once when you are at a hospital or clinic, and once when you are not at a hospital or clinic. Record the average of the two measurements. To check your blood pressure when you are not at a hospital or clinic, you can use:  An automated blood pressure machine at a pharmacy.  A home blood pressure monitor.  If you are 36-48 years old, ask your health care provider if you should take aspirin to prevent heart disease.  Diabetes screening involves taking a blood sample to check your fasting blood sugar level. This should be done once every 3 years after age 29 if you are at a normal weight and without risk factors for diabetes. Testing should be considered at a younger age or be carried out more frequently if you are overweight and have at least 1 risk factor for diabetes.  Colorectal cancer can be detected and often prevented. Most routine colorectal cancer screening begins at the age of 49 and continues through age 24. However, your health care provider may recommend screening at an earlier age if you have risk factors for colon cancer. On a yearly basis, your health care provider may provide home test kits to check for hidden blood in the stool. A small camera at the end of a tube may be used to directly examine the colon (sigmoidoscopy or colonoscopy) to detect the earliest forms of colorectal cancer. Talk to your health care provider about this at age 60 when routine screening begins. A direct exam of the colon should be repeated every 5-10 years through age 37, unless early forms of precancerous polyps or small growths are found.  People who are at an increased risk for hepatitis B should be screened for this virus. You are considered at high risk for hepatitis B if:  You were born in a country where hepatitis B occurs often. Talk with your  health care provider about which countries are considered high risk.  Your parents were born in a high-risk country and you have not received a shot to protect against hepatitis B (hepatitis B vaccine).  You have HIV or AIDS.  You use needles to inject street drugs.  You live with, or have sex with, someone who has hepatitis B.  You are a man who has sex with other men (MSM).  You get hemodialysis treatment.  You take certain medicines for conditions like cancer, organ transplantation, and autoimmune conditions.  Hepatitis C blood testing is recommended for all people born from [birth year] through 1966 and any individual with known risk factors for hepatitis C.  Healthy men should no longer receive prostate-specific antigen (PSA) blood tests as part of routine cancer screening. Talk to your health care provider about prostate cancer screening.  Testicular cancer screening is not recommended for adolescents or adult males who have no symptoms. Screening includes self-exam, a health care provider exam, and other screening tests. Consult with your health care provider about any symptoms you have or any concerns you have about testicular cancer.  Practice safe sex. Use condoms and avoid high-risk sexual practices to reduce the spread of sexually transmitted infections (STIs).  You should be screened for STIs, including gonorrhea and chlamydia if:  You are sexually active and are younger than 24 years.  You are older than 24 years, and your health care provider tells you that you are at risk for this type of infection.  Your sexual activity has changed since you were last screened, and you are at an increased risk for chlamydia or gonorrhea. Ask your health care provider if you are at risk.  If you are at risk of being infected with HIV, it is recommended that you take a prescription medicine daily to prevent HIV infection. This is called pre-exposure prophylaxis (PrEP). You are considered at  risk if:  You are a man who has sex with other men (MSM).  You are a heterosexual man who is sexually active with multiple partners.  You take drugs by injection.  You are sexually active with a partner who has HIV.  Talk with your health care provider about whether you are at high risk of being infected with HIV. If you choose to begin PrEP, you should first be tested for HIV. You should then be tested every 3 months for as long as you are taking PrEP.  Use sunscreen. Apply sunscreen liberally and repeatedly throughout the day. You should seek shade when your shadow is shorter than you. Protect yourself by wearing long sleeves, pants, a wide-brimmed hat, and sunglasses year round whenever you are outdoors.  Tell your health care provider of new moles or changes in moles, especially if there is a change in shape or color. Also, tell your health care provider if a mole is larger than the size of a pencil eraser.  A one-time screening for abdominal aortic aneurysm (AAA) and surgical repair of large AAAs by ultrasound is recommended for men aged 65-75 years who are current or former smokers.  Stay current with your vaccines (immunizations).   This information is not intended to replace advice given to you by your health care provider. Make sure you discuss any questions you have with your health care provider.   Document Released: 03/14/2008 Document Revised: 10/07/2014 Document Reviewed: 02/11/2011 Elsevier Interactive Patient Education 2016 Elsevier  Inc.  

## 2016-07-31 NOTE — Progress Notes (Signed)
Pt is being seen for a possible sinus infection Pt needs refills on his medication

## 2016-07-31 NOTE — Progress Notes (Signed)
Erik Ward, is a 60 y.o. male  ZOX:[id number]  [id number]  DOB - 04-Apr-1956  CC:  Chief Complaint  Patient presents with  . Sinusitis       HPI: Erik Ward is a 60 y.o. male here today to establish medical care, Lasting clinic March 2017 for acute frontal sinusitis, back today with concerns of another sinus infection a week ago he had some left frontal sinus pressures actually says is getting better. Denies any nasal drainage. He denies any fevers or chills, just a mild nonproductive cough intermittently. He is requesting antibiotics today, if applicable.  He smokes about 5 cigarettes a day, not ready to stop.  Denies alcohol.  Patient has No headache, No chest pain, No abdominal pain - No Nausea, No new weakness tingling or numbness, No Cough - SOB.    Review of Systems: Per history of present illness, otherwise all systems reviewed [facility] unremarkable except for about  No Known Allergies Past Medical History:  Diagnosis Date  . Subarachnoid hemorrhage following injury, with loss of consciousness (HCC) 09/18/2012   syncope and collapse (09/18/2012)  . Syncope and collapse 09/18/2012   w/loss of consciousness (09/18/2012)   Current Outpatient Prescriptions on File Prior to Visit  Medication Sig Dispense Refill  . amoxicillin-clavulanate (AUGMENTIN) 875-125 MG tablet Take 1 tablet by mouth 2 (two) times daily. 14 tablet 0  . meloxicam (MOBIC) 15 MG tablet Take 1 tablet (15 mg total) by mouth daily. Finger inflammation 30 tablet 0  . traMADol (ULTRAM) 50 MG tablet Take 1 tablet (50 mg total) by mouth every 6 (six) hours as needed. 15 tablet 0  . Vitamin D, Ergocalciferol, (DRISDOL) 50000 UNITS CAPS capsule Take 1 capsule (50,000 Units total) by mouth every 7 (seven) days. 10 capsule 2   No current facility-administered medications on file prior to visit.    No family history on file. Social History   Social History  . Marital status: Married    Spouse  name: N/A  . Number of children: N/A  . Years of education: N/A   Occupational History  . Not on file.   Social History Main Topics  . Smoking status: Current Every Day Smoker    Packs/day: 0.50    Years: 40.00    Types: Cigarettes  . Smokeless tobacco: Never Used     Comment: 09/18/2012 offered smoking cessation materials; pt declines  . Alcohol use No  . Drug use: No  . Sexual activity: Not Currently   Other Topics Concern  . Not on file   Social History Narrative  . No narrative on file    Objective:   Vitals:   07/31/16 1035  BP: 117/75  Pulse: 80  Resp: 18  Temp: 98 F (36.7 C)    Filed Weights   07/31/16 1035  Weight: 167 lb (75.8 kg)    BP Readings from Last 3 Encounters:  07/31/16 117/75  12/13/15 127/85  07/24/15 130/88    Physical Exam: Constitutional: Patient appears well-developed and well-nourished. No distress. AAOx3, pleasant HENT: Normocephalic, atraumatic, External right and left ear normal. Oropharynx is clear and moist.  Bilateral TMs are clear. Ears are clear. Nares are normal, no bogginess noted. No enlarged tonsils noted. clear oropharynx, no cobblestoning.  No frontal or max sinus tenderness to palpation. Eyes: Conjunctivae and EOM are normal. PERRL, no scleral icterus. Neck: Normal ROM. Neck supple. No JVD. No tracheal deviation. No thyromegaly. CVS: RRR, S1/S2 +, no murmurs, no gallops, no carotid bruit.  Pulmonary: Effort and breath sounds normal, no stridor, rhonchi, wheezes, rales.  Abdominal: Soft. BS +, no distension, tenderness, rebound or guarding.  Musculoskeletal: Normal range of motion. No edema and no tenderness.  LE: bilat/ no c/c/e, pulses 2+ bilateral. Neuro: Alert.  muscle tone coordination wnl. No cranial nerve deficit grossly. Skin: Skin is warm and dry. No rash noted. Not diaphoretic. No erythema. No pallor. Psychiatric: Normal mood and affect. Behavior, judgment, thought content normal.  Lab Results  Component  Value Date   WBC 5.6 10/29/2013   HGB 16.6 10/29/2013   HCT 48.7 10/29/2013   MCV 90.9 10/29/2013   PLT 300 10/29/2013   Lab Results  Component Value Date   CREATININE 1.00 10/29/2013   BUN 14 10/29/2013   NA 137 10/29/2013   K 4.3 10/29/2013   CL 104 10/29/2013   CO2 26 10/29/2013    Lab Results  Component Value Date   HGBA1C 5.9 07/31/2016   Lipid Panel     Component Value Date/Time   CHOL 181 10/29/2013 1628   TRIG 194 (H) 10/29/2013 1628   HDL 37 (L) 10/29/2013 1628   CHOLHDL 4.9 10/29/2013 1628   VLDL 39 10/29/2013 1628   LDLCALC 105 (H) 10/29/2013 1628       Depression screen PHQ 2/9 10/29/2013  Decreased Interest 0  Down, Depressed, Hopeless 0  PHQ - 2 Score 0    Suspect    1. Suspect resolved sinusitis infection, possibly a component of allergic rhinitis left - no abx warranted at this time. - fluticasone (FLONASE) 50 MCG/ACT nasal spray; Place 2 sprays into both nostrils daily.  Dispense: 16 g; Refill: 1  2. Tobacco abuse Erik Ward was counseled on the dangers of tobacco use, and was advised to quit. Reviewed strategies to maximize success, including removing cigarettes and smoking materials from environment, stress management and support of family/friends.   3. Prediabetes - Glucose (CBG) 125 - HgB A1c  5.9 - low diet discussed, may benefit with metformin later, will follow  4. Vitamin D deficiency - VITAMIN D 25 Hydroxy (Vit-D Deficiency, Fractures)  5. Allergic rhinitis, unspecified chronicity, unspecified seasonality, unspecified trigger See #1  6. Healthcare maintenance - CBC with Differential - Lipid Panel - CMP and Liver  7. Prostate cancer screening - PSA  8. Need for hepatitis C screening test - Hepatitis C antibody  9. Encounter for screening for HIV - HIV antibody (with reflex)  10. Encounter for immunization - pneumococcal 23v today - Flu Vaccine QUAD 36+ mos IM   Return in about 3 months (around 10/31/2016).  The patient  was given clear instructions to go to ER or return to medical center if symptoms don't improve, worsen or new problems develop. The patient verbalized understanding. The patient was told to call to get lab results if they haven't heard anything in the next week.    This note has been created with Education officer, environmentalDragon speech recognition software and smart phrase technology. Any transcriptional errors are unintentional.   Pete Glatterawn T Zeah Germano, MD, MBA/MHA [facility] [place], [place] [phone]   07/31/2016, 1:50 PM

## 2016-08-01 LAB — CBC WITH DIFFERENTIAL/PLATELET
BASOS ABS: 42 {cells}/uL (ref 0–200)
Basophils Relative: 1 %
EOS PCT: 2 %
Eosinophils Absolute: 84 cells/uL (ref 15–500)
HCT: 46.3 % (ref 38.5–50.0)
HEMOGLOBIN: 15.7 g/dL (ref 13.2–17.1)
LYMPHS ABS: 1596 {cells}/uL (ref 850–3900)
LYMPHS PCT: 38 %
MCH: 30 pg (ref 27.0–33.0)
MCHC: 33.9 g/dL (ref 32.0–36.0)
MCV: 88.4 fL (ref 80.0–100.0)
MPV: 9 fL (ref 7.5–12.5)
Monocytes Absolute: 378 cells/uL (ref 200–950)
Monocytes Relative: 9 %
NEUTROS PCT: 50 %
Neutro Abs: 2100 cells/uL (ref 1500–7800)
Platelets: 310 10*3/uL (ref 140–400)
RBC: 5.24 MIL/uL (ref 4.20–5.80)
RDW: 14.1 % (ref 11.0–15.0)
WBC: 4.2 10*3/uL (ref 3.8–10.8)

## 2016-08-01 LAB — HEPATITIS C ANTIBODY: HCV Ab: NEGATIVE

## 2016-08-01 LAB — HIV ANTIBODY (ROUTINE TESTING W REFLEX): HIV 1&2 Ab, 4th Generation: NONREACTIVE

## 2016-08-01 LAB — VITAMIN D 25 HYDROXY (VIT D DEFICIENCY, FRACTURES): Vit D, 25-Hydroxy: 10 ng/mL — ABNORMAL LOW (ref 30–100)

## 2016-08-03 ENCOUNTER — Other Ambulatory Visit: Payer: Self-pay | Admitting: Internal Medicine

## 2016-08-03 MED ORDER — VITAMIN D (ERGOCALCIFEROL) 1.25 MG (50000 UNIT) PO CAPS: 50000.0000 [IU] | ORAL_CAPSULE | ORAL | 0 refills | Status: DC

## 2016-08-07 ENCOUNTER — Telehealth: Payer: Self-pay

## 2016-08-07 NOTE — Telephone Encounter (Signed)
Contacted pt to go over lab results pt didn't answer lvm asking pt to give me a call back at his earliest convenience  

## 2016-08-09 ENCOUNTER — Telehealth: Payer: Self-pay

## 2016-08-09 NOTE — Telephone Encounter (Signed)
Contacted pt to go over lab results pt is aware of results and doesn't have any questions or concerns 

## 2016-08-13 NOTE — Telephone Encounter (Signed)
Called pt. And LVM letting him know that an Rx is ready for pick up at the pharmacy.

## 2016-08-14 ENCOUNTER — Telehealth: Payer: Self-pay | Admitting: Internal Medicine

## 2016-08-14 MED ORDER — BENZONATATE 100 MG PO CAPS: 100.0000 mg | ORAL_CAPSULE | Freq: Three times a day (TID) | ORAL | 0 refills | Status: DC | PRN

## 2016-08-14 MED FILL — BENZONATATE 100 MG CAPSULE: 100 | 10 days supply | Qty: 30 | Fill #0

## 2016-08-14 NOTE — Telephone Encounter (Signed)
Attempted to call pt, no answer, left VM  Jay'a - can you call him later and chk on him. doubful due to pneumococcal or flu vac, since it was over 2 wks ago. He probably has a cold? Any congestion /fevers? Coughing? - I  Put in rx for tessalon pearls to see if helps - if he has cold. If not getting better, would recd makes appt w/ one of us to f/u soon - walkin clinic w/ Marylene LandAngela? thx

## 2016-08-14 NOTE — Telephone Encounter (Signed)
Patient called the office to speak with nurse regarding the possible side effects that he is experiencing from with Flu shot or tetanus. Pt's throat is tight and hurting. Please follow up.   Thank you.

## 2016-08-14 NOTE — Telephone Encounter (Signed)
Patient returning call to PCP...please follow up

## 2016-08-14 NOTE — Telephone Encounter (Signed)
Will forward to pcp

## 2016-08-15 ENCOUNTER — Telehealth: Payer: Self-pay | Admitting: Internal Medicine

## 2016-08-15 NOTE — Telephone Encounter (Signed)
Patient called the office returning nurse's call. Please follow up.

## 2016-08-15 NOTE — Telephone Encounter (Signed)
Returned pt call pt didn't answer lvm for pt to give me a call aback at his earliest convenience

## 2016-08-16 NOTE — Telephone Encounter (Signed)
Pt returned call and is aware of rx and if he is not feeling better to schedule an appointment

## 2016-08-19 MED FILL — VIT D2 1.25 MG (50,000 UNIT: 1.25 MG | 84 days supply | Qty: 12 | Fill #0

## 2016-11-14 ENCOUNTER — Encounter: Payer: Self-pay | Admitting: Physician Assistant

## 2016-11-14 ENCOUNTER — Ambulatory Visit: Payer: Self-pay | Attending: Physician Assistant | Admitting: Physician Assistant

## 2016-11-14 VITALS — BP 121/78 | HR 75 | Temp 98.3°F | Resp 16 | Wt 167.8 lb

## 2016-11-14 DIAGNOSIS — J0100 Acute maxillary sinusitis, unspecified: Secondary | ICD-10-CM | POA: Insufficient documentation

## 2016-11-14 DIAGNOSIS — L989 Disorder of the skin and subcutaneous tissue, unspecified: Secondary | ICD-10-CM | POA: Insufficient documentation

## 2016-11-14 MED ORDER — SALINE SPRAY 0.65 % NA SOLN
1.0000 | NASAL | 0 refills | Status: DC | PRN
Start: 2016-11-14 — End: 2018-02-25

## 2016-11-14 MED ORDER — AZITHROMYCIN 250 MG PO TABS: ORAL_TABLET | ORAL | 0 refills | Status: DC

## 2016-11-14 MED ORDER — PHENYLEPHRINE-DM-GG-APAP 5-10-200-325 MG/10ML PO LIQD: 20.0000 mL | ORAL | 0 refills | Status: AC

## 2016-11-14 MED FILL — AZITHROMYCIN 250 MG TABLET: 250 | 5 days supply | Qty: 6 | Fill #0

## 2016-11-14 NOTE — Progress Notes (Signed)
Chronic sinusitis, currently 2 days left sided maxillary sinus pressure   Subjective:  Patient ID: Erik Ward, male    DOB: 09-Jun-1956  Age: 61 y.o. MRN: [id number]  CC: Sinus congestion and welts   HPI Erik SimmeringSammie Govan is a 61 y.o. male with a medical history of subarachnoid hemorrhage that presents with a two-week history of sinus congestion and left maxillary sinus pressure with mild pain to the left ear. Has not taken anything for relief. No close contacts with the same. Has also noted unassociated "welts" on forearms and pretibial areas bilaterally. The welts appeared as vesicles at first but have since dried up. Denies any pain, erythema, ecchymosis, crusting, or suppuration. Denies chest pain, shortness of breath, headache, abdominal pain, nausea, vomiting, fever, chills, or GI/GU symptoms.     Outpatient Medications Prior to Visit  Medication Sig Dispense Refill  . benzonatate (TESSALON PERLES) 100 MG capsule Take 1 capsule (100 mg total) by mouth 3 (three) times daily as needed for cough. (Patient not taking: Reported on 11/14/2016) 30 capsule 0  . fluticasone (FLONASE) 50 MCG/ACT nasal spray Place 2 sprays into both nostrils daily. (Patient not taking: Reported on 11/14/2016) 16 g 1  . traMADol (ULTRAM) 50 MG tablet Take 1 tablet (50 mg total) by mouth every 6 (six) hours as needed. (Patient not taking: Reported on 07/31/2016) 15 tablet 0  . Vitamin D, Ergocalciferol, (DRISDOL) 50000 units CAPS capsule Take 1 capsule (50,000 Units total) by mouth every 7 (seven) days. (Patient not taking: Reported on 11/14/2016) 12 capsule 0   No facility-administered medications prior to visit.      ROS Review of Systems  Constitutional: Negative for chills and fever.  HENT: Positive for congestion, ear pain and sinus pain. Negative for sore throat.   Eyes: Negative for pain.  Respiratory: Positive for cough.   Cardiovascular: Negative for chest pain and palpitations.  Gastrointestinal:  Negative for abdominal pain, nausea and vomiting.  Genitourinary: Negative for dysuria.  Musculoskeletal: Negative for myalgias.  Skin: Negative for rash.  Neurological: Negative for headaches.    Objective:  BP 121/78 (BP Location: Left Arm, Patient Position: Sitting, Cuff Size: Normal)   Pulse 75   Temp 98.3 F (36.8 C) (Oral)   Resp 16   Wt 167 lb 12.8 oz (76.1 kg)   SpO2 96%   BMI 27.08 kg/m   BP/Weight 11/14/2016 07/31/2016 12/13/2015  Systolic BP 121 117 127  Diastolic BP 78 75 85  Wt. (Lbs) 167.8 167 180  BMI 27.08 26.95 29.07      Physical Exam  Constitutional: He is oriented to person, place, and time.  NAD, normal body habitus, polite  HENT:  Head: Normocephalic and atraumatic.  Left TM mildly erythematous without bulging or retraction, turbinates moderately erythematous with scant rhinorrhea, oropharynx with cobblestoning changes and mild postnasal drip, no exudates  Eyes: No scleral icterus.  Cardiovascular: Normal rate, regular rhythm and normal heart sounds.   Pulmonary/Chest: Effort normal and breath sounds normal.  Abdominal: Soft.  Musculoskeletal: He exhibits no edema.  Lymphadenopathy:    He has no cervical adenopathy.  Neurological: He is alert and oriented to person, place, and time.  Skin: Skin is warm and dry.  Psychiatric: He has a normal mood and affect. His behavior is normal.     Assessment & Plan:   1. Acute maxillary sinusitis, recurrence not specified -Z-Pak, Mucinex, nasal flush  2. Skin lesion -Unidentifiable at this point. Patient advised to return if new lesions appear.  Meds ordered this encounter  Medications  . azithromycin (ZITHROMAX) 250 MG tablet    Sig: Take two tablets on day one then one tablet daily thereafter.    Dispense:  6 tablet    Refill:  0    Order Specific Question:   Supervising Provider    Answer:   Quentin Angst [id number]  . Phenylephrine-DM-GG-APAP (MUCINEX FAST-MAX COLD FLU) 5-10-200-325  MG/10ML LIQD    Sig: Take 20 mLs by mouth every 4 (four) hours.    Dispense:  1 Bottle    Refill:  0    Order Specific Question:   Supervising Provider    Answer:   Quentin Angst [id number]  . sodium chloride (OCEAN) 0.65 % SOLN nasal spray    Sig: Place 1 spray into both nostrils as needed for congestion.    Dispense:  1 Bottle    Refill:  0    Order Specific Question:   Supervising Provider    Answer:   Quentin Angst [[id number]]    Follow-up: Return in about 2 weeks (around 11/28/2016) for general physical.   Loletta Specter PA

## 2016-11-14 NOTE — Patient Instructions (Signed)
Take medications as directed. Return if symptoms are persistent or worsen. Also, return if you see lesions reappear. We may be able to test lesions if they are fresh.  Sinusitis, Adult Sinusitis is soreness and inflammation of your sinuses. Sinuses are hollow spaces in the bones around your face. Your sinuses are located:  Around your eyes.  In the middle of your forehead.  Behind your nose.  In your cheekbones. Your sinuses and nasal passages are lined with a stringy fluid (mucus). Mucus normally drains out of your sinuses. When your nasal tissues become inflamed or swollen, the mucus can become trapped or blocked so air cannot flow through your sinuses. This allows bacteria, viruses, and funguses to grow, which leads to infection. Sinusitis can develop quickly and last for 7?10 days (acute) or for more than 12 weeks (chronic). Sinusitis often develops after a cold. What are the causes? This condition is caused by anything that creates swelling in the sinuses or stops mucus from draining, including:  Allergies.  Asthma.  Bacterial or viral infection.  Abnormally shaped bones between the nasal passages.  Nasal growths that contain mucus (nasal polyps).  Narrow sinus openings.  Pollutants, such as chemicals or irritants in the air.  A foreign object stuck in the nose.  A fungal infection. This is rare. What increases the risk? The following factors may make you more likely to develop this condition:  Having allergies or asthma.  Having had a recent cold or respiratory tract infection.  Having structural deformities or blockages in your nose or sinuses.  Having a weak immune system.  Doing a lot of swimming or diving.  Overusing nasal sprays.  Smoking. What are the signs or symptoms? The main symptoms of this condition are pain and a feeling of pressure around the affected sinuses. Other symptoms include:  Upper toothache.  Earache.  Headache.  Bad  breath.  Decreased sense of smell and taste.  A cough that may get worse at night.  Fatigue.  Fever.  Thick drainage from your nose. The drainage is often green and it may contain pus (purulent).  Stuffy nose or congestion.  Postnasal drip. This is when extra mucus collects in the throat or back of the nose.  Swelling and warmth over the affected sinuses.  Sore throat.  Sensitivity to light. How is this diagnosed? This condition is diagnosed based on symptoms, a medical history, and a physical exam. To find out if your condition is acute or chronic, your health care provider may:  Look in your nose for signs of nasal polyps.  Tap over the affected sinus to check for signs of infection.  View the inside of your sinuses using an imaging device that has a light attached (endoscope). If your health care provider suspects that you have chronic sinusitis, you may also:  Be tested for allergies.  Have a sample of mucus taken from your nose (nasal culture) and checked for bacteria.  Have a mucus sample examined to see if your sinusitis is related to an allergy. If your sinusitis does not respond to treatment and it lasts longer than 8 weeks, you may have an MRI or CT scan to check your sinuses. These scans also help to determine how severe your infection is. In rare cases, a bone biopsy may be done to rule out more serious types of fungal sinus disease. How is this treated? Treatment for sinusitis depends on the cause and whether your condition is chronic or acute. If a virus  is causing your sinusitis, your symptoms will go away on their own within 10 days. You may be given medicines to relieve your symptoms, including:  Topical nasal decongestants. They shrink swollen nasal passages and let mucus drain from your sinuses.  Antihistamines. These drugs block inflammation that is triggered by allergies. This can help to ease swelling in your nose and sinuses.  Topical nasal  corticosteroids. These are nasal sprays that ease inflammation and swelling in your nose and sinuses.  Nasal saline washes. These rinses can help to get rid of thick mucus in your nose. If your condition is caused by bacteria, you will be given an antibiotic medicine. If your condition is caused by a fungus, you will be given an antifungal medicine. Surgery may be needed to correct underlying conditions, such as narrow nasal passages. Surgery may also be needed to remove polyps. Follow these instructions at home: Medicines  Take, use, or apply over-the-counter and prescription medicines only as told by your health care provider. These may include nasal sprays.  If you were prescribed an antibiotic medicine, take it as told by your health care provider. Do not stop taking the antibiotic even if you start to feel better. Hydrate and Humidify  Drink enough water to keep your urine clear or pale yellow. Staying hydrated will help to thin your mucus.  Use a cool mist humidifier to keep the humidity level in your home above 50%.  Inhale steam for 10-15 minutes, 3-4 times a day or as told by your health care provider. You can do this in the bathroom while a hot shower is running.  Limit your exposure to cool or dry air. Rest  Rest as much as possible.  Sleep with your head raised (elevated).  Make sure to get enough sleep each night. General instructions  Apply a warm, moist washcloth to your face 3-4 times a day or as told by your health care provider. This will help with discomfort.  Wash your hands often with soap and water to reduce your exposure to viruses and other germs. If soap and water are not available, use hand sanitizer.  Do not smoke. Avoid being around people who are smoking (secondhand smoke).  Keep all follow-up visits as told by your health care provider. This is important. Contact a health care provider if:  You have a fever.  Your symptoms get worse.  Your  symptoms do not improve within 10 days. Get help right away if:  You have a severe headache.  You have persistent vomiting.  You have pain or swelling around your face or eyes.  You have vision problems.  You develop confusion.  Your neck is stiff.  You have trouble breathing. This information is not intended to replace advice given to you by your health care provider. Make sure you discuss any questions you have with your health care provider. Document Released: 09/16/2005 Document Revised: 05/12/2016 Document Reviewed: 07/12/2015 [employer] Interactive Patient Education  2017 [employer]

## 2016-12-02 ENCOUNTER — Encounter: Payer: Medicaid Other | Admitting: Internal Medicine

## 2016-12-24 ENCOUNTER — Ambulatory Visit: Payer: Self-pay | Attending: Internal Medicine | Admitting: Internal Medicine

## 2016-12-24 ENCOUNTER — Encounter: Payer: Self-pay | Admitting: Internal Medicine

## 2016-12-24 VITALS — BP 114/82 | HR 77 | Temp 98.0°F | Resp 16 | Wt 162.0 lb

## 2016-12-24 DIAGNOSIS — Z1329 Encounter for screening for other suspected endocrine disorder: Secondary | ICD-10-CM | POA: Insufficient documentation

## 2016-12-24 DIAGNOSIS — E781 Pure hyperglyceridemia: Secondary | ICD-10-CM | POA: Insufficient documentation

## 2016-12-24 DIAGNOSIS — Z1321 Encounter for screening for nutritional disorder: Secondary | ICD-10-CM | POA: Insufficient documentation

## 2016-12-24 DIAGNOSIS — Z87828 Personal history of other (healed) physical injury and trauma: Secondary | ICD-10-CM | POA: Insufficient documentation

## 2016-12-24 DIAGNOSIS — J302 Other seasonal allergic rhinitis: Secondary | ICD-10-CM | POA: Insufficient documentation

## 2016-12-24 DIAGNOSIS — L97921 Non-pressure chronic ulcer of unspecified part of left lower leg limited to breakdown of skin: Secondary | ICD-10-CM | POA: Insufficient documentation

## 2016-12-24 DIAGNOSIS — I609 Nontraumatic subarachnoid hemorrhage, unspecified: Secondary | ICD-10-CM

## 2016-12-24 DIAGNOSIS — F17210 Nicotine dependence, cigarettes, uncomplicated: Secondary | ICD-10-CM | POA: Insufficient documentation

## 2016-12-24 DIAGNOSIS — Z23 Encounter for immunization: Secondary | ICD-10-CM

## 2016-12-24 DIAGNOSIS — L97911 Non-pressure chronic ulcer of unspecified part of right lower leg limited to breakdown of skin: Secondary | ICD-10-CM | POA: Insufficient documentation

## 2016-12-24 DIAGNOSIS — Z720 Tobacco use: Secondary | ICD-10-CM

## 2016-12-24 MED ORDER — FLUTICASONE PROPIONATE 50 MCG/ACT NA SUSP: 2.0000 | Freq: Every day | NASAL | 6 refills | Status: DC

## 2016-12-24 MED ORDER — LORATADINE 10 MG PO TABS: 10.0000 mg | ORAL_TABLET | Freq: Every day | ORAL | 11 refills | Status: DC

## 2016-12-24 NOTE — Progress Notes (Signed)
Erik SimmeringSammie Miotke, is a 61 y.o. male  ZHY:[id number]:[id number]  EXB:[id number]  DOB - 1956/03/13  Chief Complaint  Patient presents with  . Annual Exam        Subjective:   Erik Ward is a 61 y.o. male here today for a follow up visit, last seen in clinic by pa 11/14/16 for acute sinusitis, w/ pmhx of subarachnoid hemorrhage and tob abuse.   He states he did not see much improvement w/ the abx, still get pressure behind his sinuses when he lays down and congestion. Denies cough/postnasal drip/congestion during day.  He was unable to pick up flonase/allegy med trial.  He is still smoking, 5-6 cigs/day.    Also has some leg wounds on his legs that have been there for while, he was picking at them recently and some started getting red/sore.   Patient has No headache, No chest pain, No abdominal pain - No Nausea, No new weakness tingling or numbness, No Cough - SOB.  No problems updated.  ALLERGIES: No Known Allergies  PAST MEDICAL HISTORY: Past Medical History:  Diagnosis Date  . Subarachnoid hemorrhage following injury, with loss of consciousness (HCC) 09/18/2012   syncope and collapse (09/18/2012)  . Syncope and collapse 09/18/2012   w/loss of consciousness (09/18/2012)    MEDICATIONS AT HOME: Prior to Admission medications   Medication Sig Start Date End Date Taking? Authorizing Provider  azithromycin (ZITHROMAX) 250 MG tablet Take two tablets on day one then one tablet daily thereafter. Patient not taking: Reported on 12/24/2016 11/14/16   Loletta Specteroger David Gomez, PA-C  fluticasone [facility](FLONASE) 50 MCG/ACT nasal spray Place 2 sprays into both nostrils daily. 12/24/16   Pete Glatterawn T Langeland, MD  loratadine (CLARITIN) 10 MG tablet Take 1 tablet (10 mg total) by mouth daily. 12/24/16   Pete Glatterawn T Langeland, MD  sodium chloride (OCEAN) 0.65 % SOLN nasal spray Place 1 spray into both nostrils as needed for congestion. Patient not taking: Reported on 12/24/2016 11/14/16   Loletta Specteroger David Gomez, PA-C      Objective:   Vitals:   12/24/16 1608  BP: 114/82  Pulse: 77  Resp: 16  Temp: 98 F (36.7 C)  TempSrc: Oral  SpO2: 95%  Weight: 162 lb (73.5 kg)    Exam General appearance : Awake, alert, not in any distress. Speech Clear. Not toxic looking, thin appearing AAM. HEENT: Atraumatic and Normocephalic, pupils equally reactive to light. bilat tms clear.  Nares w/ mild erythema, but not boggy. No ttp on facial /max sinuses. No cobblestoning in O/P, no enlarged tonsils or exudate noted bilat. Neck: supple, no JVD. No cervical lymphadenopathy.  Chest:Good air entry bilaterally, no added sounds. CVS: S1 S2 regular, no murmurs/gallups or rubs. Abdomen: Bowel sounds active, Non tender and not distended with no gaurding, rigidity or rebound. Extremities: thin legs w/ muscle atrophy noted bilat, 1+ pulses bilat, w/ some poorly healing ulcers noted bilat legs.    Neurology: Awake alert, and oriented X 3, CN II-XII grossly intact, Non focal Skin:No Rash  Data Review Lab Results  Component Value Date   HGBA1C 5.9 07/31/2016   HGBA1C 6.3 (H) 09/18/2012    Depression screen PHQ 2/9 07/31/2016 10/29/2013  Decreased Interest 0 0  Down, Depressed, Hopeless 0 0  PHQ - 2 Score 0 0      Assessment & Plan   1. Ulcers of both lower extremities, limited to breakdown of skin (HCC) - suspect pvd. - no signs of cellulitis, local inflammation note, I gave him  several packets of top abx today to use bid for x 1wk. - total tob smoking cessation recd. - VAS [place] ABI WITH/WO TBI; Future - CBC with Differential - Basic metabolic panel  2. Chronic seasonal allergic rhinitis, unspecified trigger No signs of acute sinusitis noted at this time. - recd trial flonase/claritin - rx sent.  3. h/o Subarachnoid hemorrhage (HCC) No acute deficits noted  4. Tobacco abuse Total cessation recd, tips provided, lengthy discussion about long term consequences of continue smoking,  For your tobacco  abuse: Strongly recommend that he stop smoking back and all other inhaled products right away Call 1-800-Quit-Now to get free nicotine replacement from the state of [place]  5. Borderline hypertriglyceridemia To address this please limit saturated fat to no more than 7% of your calories, limit cholesterol to 200 mg/day, increase fiber and exercise as tolerated. If needed we may add cholesterol lowering medication to your regimen.    6. Encounter for vitamin deficiency screening - VITAMIN D 25 Hydroxy (Vit-D Deficiency, Fractures)  7. Thyroid disorder screen - TSH     Patient have been counseled extensively about nutrition and exercise  Return in about 3 months (around 03/26/2017).  The patient was given clear instructions to go to ER or return to medical center if symptoms don't improve, worsen or new problems develop. The patient verbalized understanding. The patient was told to call to get lab results if they haven't heard anything in the next week.   This note has been created with Education officer, environmental. Any transcriptional errors are unintentional.   Pete Glatter, MD, MBA/MHA [facility] and [facility] [place], [place] [phone]   12/24/2016, 4:57 PM

## 2016-12-24 NOTE — Addendum Note (Signed)
Addended by: Particia LatherPOLLOCK, JAY'A R on: 12/24/2016 05:18 PM   Modules accepted: Orders

## 2016-12-24 NOTE — Patient Instructions (Addendum)
- financial aid /  - For your tobacco abuse: Strongly recommend that he stop smoking back and all other inhaled products right away Call 1-800-Quit-Now to get free nicotine replacement from the state of [place]  -   Steps to Quit Smoking Smoking tobacco can be bad for your health. It can also affect almost every organ in your body. Smoking puts you and people around you at risk for many serious long-lasting (chronic) diseases. Quitting smoking is hard, but it is one of the best things that you can do for your health. It is never too late to quit. What are the benefits of quitting smoking? When you quit smoking, you lower your risk for getting serious diseases and conditions. They can include:  Lung cancer or lung disease.  Heart disease.  Stroke.  Heart attack.  Not being able to have children (infertility).  Weak bones (osteoporosis) and broken bones (fractures). If you have coughing, wheezing, and shortness of breath, those symptoms may get better when you quit. You may also get sick less often. If you are pregnant, quitting smoking can help to lower your chances of having a baby of low birth weight. What can I do to help me quit smoking? Talk with your doctor about what can help you quit smoking. Some things you can do (strategies) include:  Quitting smoking totally, instead of slowly cutting back how much you smoke over a period of time.  Going to in-person counseling. You are more likely to quit if you go to many counseling sessions.  Using resources and support systems, such as:  Online chats with a Veterinary surgeoncounselor.  Phone quitlines.  Printed Materials engineerself-help materials.  Support groups or group counseling.  Text messaging programs.  Mobile phone apps or applications.  Taking medicines. Some of these medicines may have nicotine in them. If you are pregnant or breastfeeding, do not take any medicines to quit smoking unless your doctor says it is okay. Talk with your doctor about  counseling or other things that can help you. Talk with your doctor about using more than one strategy at the same time, such as taking medicines while you are also going to in-person counseling. This can help make quitting easier. What things can I do to make it easier to quit? Quitting smoking might feel very hard at first, but there is a lot that you can do to make it easier. Take these steps:  Talk to your family and friends. Ask them to support and encourage you.  Call phone quitlines, reach out to support groups, or work with a Veterinary surgeoncounselor.  Ask people who smoke to not smoke around you.  Avoid places that make you want (trigger) to smoke, such as:  Bars.  Parties.  Smoke-break areas at work.  Spend time with people who do not smoke.  Lower the stress in your life. Stress can make you want to smoke. Try these things to help your stress:  Getting regular exercise.  Deep-breathing exercises.  Yoga.  Meditating.  Doing a body scan. To do this, close your eyes, focus on one area of your body at a time from head to toe, and notice which parts of your body are tense. Try to relax the muscles in those areas.  Download or buy apps on your mobile phone or tablet that can help you stick to your quit plan. There are many free apps, such as QuitGuide from the [employer] developer(Centers for Disease Control and Prevention). You can find more support from  smokefree.gov and other websites. This information is not intended to replace advice given to you by your health care provider. Make sure you discuss any questions you have with your health care provider. Document Released: 07/13/2009 Document Revised: 05/14/2016 Document Reviewed: 01/31/2015 Elsevier Interactive Patient Education  2017 Elsevier Inc.  -   Allergies An allergy is when your body reacts to a substance in a way that is not normal. An allergic reaction can happen after you:  Eat something.  Breathe in something.  Touch  something. You can be allergic to:  Things that are only around during certain seasons, like molds and pollens.  Foods.  Drugs.  Insects.  Animal dander. What are the signs or symptoms?  Puffiness (swelling). This may happen on the lips, face, tongue, mouth, or throat.  Sneezing.  Coughing.  Breathing loudly (wheezing).  Stuffy nose.  Tingling in the mouth.  A rash.  Itching.  Itchy, red, puffy areas of skin (hives).  Watery eyes.  Throwing up (vomiting).  Watery poop (diarrhea).  Dizziness.  Feeling faint or fainting.  Trouble breathing or swallowing.  A tight feeling in the chest.  A fast heartbeat. How is this diagnosed? Allergies can be diagnosed with:  A medical and family history.  Skin tests.  Blood tests.  A food diary. A food diary is a record of all the foods, drinks, and symptoms you have each day.  The results of an elimination diet. This diet involves making sure not to eat certain foods and then seeing what happens when you start eating them again. How is this treated? There is no cure for allergies, but allergic reactions can be treated with medicine. Severe reactions usually need to be treated at a hospital. How is this prevented? The best way to prevent an allergic reaction is to avoid the thing you are allergic to. Allergy shots and medicines can also help prevent reactions in some cases. This information is not intended to replace advice given to you by your health care provider. Make sure you discuss any questions you have with your health care provider. Document Released: 01/11/2013 Document Revised: 05/13/2016 Document Reviewed: 06/28/2014 Elsevier Interactive Patient Education  2017 Elsevier Inc. Td Vaccine (Tetanus and Diphtheria): What You Need to Know 1. Why get vaccinated? Tetanus  and diphtheria are very serious diseases. They are rare in the [place] today, but people who do become infected often have severe  complications. Td vaccine is used to protect adolescents and adults from both of these diseases. Both tetanus and diphtheria are infections caused by bacteria. Diphtheria spreads from person to person through coughing or sneezing. Tetanus-causing bacteria enter the body through cuts, scratches, or wounds. TETANUS (lockjaw) causes painful muscle tightening and stiffness, usually all over the body.  It can lead to tightening of muscles in the head and neck so you can't open your mouth, swallow, or sometimes even breathe. Tetanus kills about 1 out of every 10 people who are infected even after receiving the best medical care. DIPHTHERIA can cause a thick coating to form in the back of the throat.  It can lead to breathing problems, paralysis, heart failure, and death. Before vaccines, as many as 200,000 cases of diphtheria and hundreds of cases of tetanus were reported in the [place] each year. Since vaccination began, reports of cases for both diseases have dropped by about 99%. 2. Td vaccine Td vaccine can protect adolescents and adults from tetanus and diphtheria. Td is usually given as a  booster dose every 10 years but it can also be given earlier after a severe and dirty wound or burn. Another vaccine, called Tdap, which protects against pertussis in addition to tetanus and diphtheria, is sometimes recommended instead of Td vaccine. Your doctor or the person giving you the vaccine can give you more information. Td may safely be given at the same time as other vaccines. 3. Some people should not get this vaccine  A person who has ever had a life-threatening allergic reaction after a previous dose of any tetanus or diphtheria containing vaccine, OR has a severe allergy to any part of this vaccine, should not get Td vaccine. Tell the person giving the vaccine about any severe allergies.  Talk to your doctor if you:  had severe pain or swelling after any vaccine containing diphtheria or  tetanus,  ever had a condition called Guillain Barre Syndrome (GBS),  aren't feeling well on the day the shot is scheduled. 4. What are the risks from Td vaccine? With any medicine, including vaccines, there is a chance of side effects. These are usually mild and go away on their own. Serious reactions are also possible but are rare. Most people who get Td vaccine do not have any problems with it. Mild problems following Td vaccine:  (Did not interfere with activities)  Pain where the shot was given (about 8 people in 10)  Redness or swelling where the shot was given (about 1 person in 4)  Mild fever (rare)  Headache (about 1 person in 4)  Tiredness (about 1 person in 4) Moderate problems following Td vaccine:  (Interfered with activities, but did not require medical attention)  Fever over 102F (rare) Severe problems following Td vaccine:  (Unable to perform usual activities; required medical attention)  Swelling, severe pain, bleeding and/or redness in the arm where the shot was given (rare). Problems that could happen after any vaccine:   People sometimes faint after a medical procedure, including vaccination. Sitting or lying down for about 15 minutes can help prevent fainting, and injuries caused by a fall. Tell your doctor if you feel dizzy, or have vision changes or ringing in the ears.  Some people get severe pain in the shoulder and have difficulty moving the arm where a shot was given. This happens very rarely.  Any medication can cause a severe allergic reaction. Such reactions from a vaccine are very rare, estimated at fewer than 1 in a million doses, and would happen within a few minutes to a few hours after the vaccination. As with any medicine, there is a very remote chance of a vaccine causing a serious injury or death. The safety of vaccines is always being monitored. For more information, visit: http://floyd.org/ 5. What if there is a serious  reaction? What should I look for?  Look for anything that concerns you, such as signs of a severe allergic reaction, very high fever, or unusual behavior. Signs of a severe allergic reaction can include hives, swelling of the face and throat, difficulty breathing, a fast heartbeat, dizziness, and weakness. These would usually start a few minutes to a few hours after the vaccination. What should I do?   If you think it is a severe allergic reaction or other emergency that can't wait, call 9-1-1 or get the person to the nearest hospital. Otherwise, call your doctor.  Afterward, the reaction should be reported to the Vaccine Adverse Event Reporting System (VAERS). Your doctor might file this report, or you can  do it yourself through the VAERS web site at www.vaers.LAgents.no, or by calling 1-(416)140-9855.  VAERS does not give medical advice. 6. The National Vaccine Injury Compensation Program The [employer] Vaccine Injury Compensation Program (VICP) is a federal program that was created to compensate people who may have been injured by certain vaccines. Persons who believe they may have been injured by a vaccine can learn about the program and about filing a claim by calling 1-(980) 640-8631 or visiting the VICP website at SpiritualWord.at. There is a time limit to file a claim for compensation. 7. How can I learn more?  Ask your doctor. He or she can give you the vaccine package insert or suggest other sources of information.  Call your local or state health department.  Contact the Centers for Disease Control and Prevention (CDC):  Call (620)379-5990 (1-800-CDC-INFO)  Visit CDC's website at PicCapture.uy CDC Td Vaccine VIS (01/09/16) This information is not intended to replace advice given to you by your health care provider. Make sure you discuss any questions you have with your health care provider. Document Released: 07/14/2006 Document Revised: 06/06/2016 Document  Reviewed: 06/06/2016 Elsevier Interactive Patient Education  2017 [employer].

## 2016-12-25 ENCOUNTER — Other Ambulatory Visit: Payer: Self-pay | Admitting: Internal Medicine

## 2016-12-25 LAB — BASIC METABOLIC PANEL
BUN/Creatinine Ratio: 13 (ref 10–24)
BUN: 11 mg/dL (ref 8–27)
CO2: 23 mmol/L (ref 18–29)
CREATININE: 0.84 mg/dL (ref 0.76–1.27)
Calcium: 9.3 mg/dL (ref 8.6–10.2)
Chloride: 102 mmol/L (ref 96–106)
GFR calc Af Amer: 108 mL/min/{1.73_m2} (ref >59)
GFR calc non Af Amer: 94 mL/min/{1.73_m2} (ref >59)
GLUCOSE: 95 mg/dL (ref 65–99)
Potassium: 4.6 mmol/L (ref 3.5–5.2)
Sodium: 141 mmol/L (ref 134–144)

## 2016-12-25 LAB — CBC WITH DIFFERENTIAL/PLATELET
Basophils Absolute: 0.1 10*3/uL (ref 0.0–0.2)
Basos: 1 %
EOS (ABSOLUTE): 0.1 10*3/uL (ref 0.0–0.4)
Eos: 3 %
HEMOGLOBIN: 15.9 g/dL (ref 13.0–17.7)
Hematocrit: 46.4 % (ref 37.5–51.0)
Immature Grans (Abs): 0 10*3/uL (ref 0.0–0.1)
Immature Granulocytes: 0 %
LYMPHS ABS: 1.9 10*3/uL (ref 0.7–3.1)
Lymphs: 44 %
MCH: 29.9 pg (ref 26.6–33.0)
MCHC: 34.3 g/dL (ref 31.5–35.7)
MCV: 87 fL (ref 79–97)
MONOCYTES: 10 %
Monocytes Absolute: 0.4 10*3/uL (ref 0.1–0.9)
NEUTROS ABS: 1.8 10*3/uL (ref 1.4–7.0)
Neutrophils: 42 %
Platelets: 260 10*3/uL (ref 150–379)
RBC: 5.32 x10E6/uL (ref 4.14–5.80)
RDW: 14.9 % (ref 12.3–15.4)
WBC: 4.4 10*3/uL (ref 3.4–10.8)

## 2016-12-25 LAB — TSH: TSH: 0.627 u[IU]/mL (ref 0.450–4.500)

## 2016-12-25 LAB — VITAMIN D 25 HYDROXY (VIT D DEFICIENCY, FRACTURES): Vit D, 25-Hydroxy: 19.3 ng/mL — ABNORMAL LOW (ref 30.0–100.0)

## 2016-12-25 MED ORDER — VITAMIN D (ERGOCALCIFEROL) 1.25 MG (50000 UNIT) PO CAPS: 50000.0000 [IU] | ORAL_CAPSULE | ORAL | 0 refills | Status: DC

## 2016-12-26 ENCOUNTER — Telehealth: Payer: Self-pay

## 2016-12-26 NOTE — Telephone Encounter (Signed)
Contacted pt to go over lab results pt didn't answer lvm asking pt to give me a call at his earliest convenience   If pt call back please give results: vit d is still very low. Please call. Vit D very low. Very low vit d, can cause bone/muscle pain. rx for Vit D replacement in chart, take weekly. Once done, buy OTC Vit D 5,000 IU and take daily. Thyroid function, kidney and blood count all nml.

## 2016-12-30 ENCOUNTER — Ambulatory Visit: Payer: Medicaid Other

## 2016-12-30 MED FILL — VIT D2 1.25 MG (50,000 UNIT: 1.25 MG | 84 days supply | Qty: 12 | Fill #0

## 2016-12-30 MED FILL — FLUTICASONE PROP 50 MCG SPR: 50 | 30 days supply | Qty: 16 | Fill #0

## 2017-01-01 ENCOUNTER — Ambulatory Visit: Payer: Medicaid Other | Attending: Internal Medicine

## 2017-02-13 ENCOUNTER — Encounter: Payer: Self-pay | Admitting: Internal Medicine

## 2017-03-26 ENCOUNTER — Ambulatory Visit: Payer: Medicaid Other | Admitting: Family Medicine

## 2017-05-01 ENCOUNTER — Ambulatory Visit: Payer: Medicaid Other | Admitting: Family Medicine

## 2017-06-25 ENCOUNTER — Ambulatory Visit: Payer: Self-pay | Admitting: Family Medicine

## 2017-07-02 ENCOUNTER — Ambulatory Visit: Payer: Self-pay | Attending: Internal Medicine | Admitting: Physician Assistant

## 2017-07-02 VITALS — BP 126/90 | HR 95 | Temp 98.4°F | Resp 16 | Wt 166.2 lb

## 2017-07-02 DIAGNOSIS — G5622 Lesion of ulnar nerve, left upper limb: Secondary | ICD-10-CM | POA: Insufficient documentation

## 2017-07-02 DIAGNOSIS — L42 Pityriasis rosea: Secondary | ICD-10-CM | POA: Insufficient documentation

## 2017-07-02 MED ORDER — MELOXICAM 15 MG PO TABS: 15.0000 mg | ORAL_TABLET | Freq: Every day | ORAL | 0 refills | Status: DC

## 2017-07-02 MED ORDER — GABAPENTIN 300 MG PO CAPS: 300.0000 mg | ORAL_CAPSULE | Freq: Every day | ORAL | 0 refills | Status: DC

## 2017-07-02 NOTE — Progress Notes (Signed)
Patient ID: Erik Ward, male   DOB: 1956/04/02, 61 y.o.   MRN: [id number]   Erik Ward, is a 61 y.o. male  [id number]  [id number]  DOB - [birth year]  Subjective:  Chief Complaint and HPI: Erik Ward is a 61 y.o. male here today for what he believes are side effects from a tetanus shot about 3-4 months ago.  2 months ago, he had a spot come up on his skin on his abdomen.  Then he developed a rash across his legs and trunk.  +pruritis.  No f/c.  Rash seems to be improving and itches much less than it did.  No tick bites, no new soaps,detergents. Doesn't have orange card but is going to apply  Also c/o L hand numbness and some pain in his L pinky and 4th finger.  NKI.  No neck pain.  No weakness.  Not dropping things.  A little worse at night.  He is R hand dominant.  No exacerbating/alleviating factors.  Does some computer work.    ROS:   Constitutional:  No f/c, No night sweats, No unexplained weight loss. EENT:  No vision changes, No blurry vision, No hearing changes. No mouth, throat, or ear problems.  Respiratory: No cough, No SOB Cardiac: No CP, no palpitations GI:  No abd pain, No N/V/D. GU: No Urinary s/sx Musculoskeletal: +L hand paresthesias Neuro: No headache, no dizziness, no motor weakness.  Skin: No rash Endocrine:  No polydipsia. No polyuria.  Psych: Denies SI/HI  No problems updated.  ALLERGIES: No Known Allergies  PAST MEDICAL HISTORY: Past Medical History:  Diagnosis Date  . Subarachnoid hemorrhage following injury, with loss of consciousness (HCC) 09/18/2012   syncope and collapse (09/18/2012)  . Syncope and collapse 09/18/2012   w/loss of consciousness (09/18/2012)    MEDICATIONS AT HOME: Prior to Admission medications   Medication Sig Start Date End Date Taking? Authorizing Provider  fluticasone (FLONASE) 50 MCG/ACT nasal spray Place 2 sprays into both nostrils daily. Patient not taking: Reported on 07/02/2017 12/24/16   Erik Glatter, MD  gabapentin (NEURONTIN) 300 MG capsule Take 1 capsule (300 mg total) by mouth at bedtime. 07/02/17   Anders Simmonds, PA-C  loratadine (CLARITIN) 10 MG tablet Take 1 tablet (10 mg total) by mouth daily. Patient not taking: Reported on 07/02/2017 12/24/16   Erik Glatter, MD  meloxicam (MOBIC) 15 MG tablet Take 1 tablet (15 mg total) by mouth daily. X 2 weeks then prn 07/02/17   Anders Simmonds, PA-C  sodium chloride (OCEAN) 0.65 % SOLN nasal spray Place 1 spray into both nostrils as needed for congestion. Patient not taking: Reported on 12/24/2016 11/14/16   Loletta Specter, PA-C  Vitamin D, Ergocalciferol, (DRISDOL) 50000 units CAPS capsule Take 1 capsule (50,000 Units total) by mouth every 7 (seven) days. Patient not taking: Reported on 07/02/2017 12/25/16   Erik Glatter, MD     Objective:  EXAM:   Vitals:   07/02/17 0935  BP: 126/90  Pulse: 95  Resp: 16  Temp: 98.4 F (36.9 C)  TempSrc: Oral  SpO2: 96%  Weight: 166 lb 3.2 oz (75.4 kg)    General appearance : A&OX3. NAD. Non-toxic-appearing HEENT: Atraumatic and Normocephalic.  PERRLA. EOM intact.   Neck: supple, no JVD. No cervical lymphadenopathy. No thyromegaly Chest/Lungs:  Breathing-non-labored, Good air entry bilaterally, breath sounds normal without rales, rhonchi, or wheezing  CVS: S1 S2 regular, no murmurs, gallops, rubs  Extremities: Lvs R hand examined-no abnormality.  Grip strength and ROM WNL.   Bilateral Lower Ext shows no edema, both legs are warm to touch with = pulse throughout Neurology:  CN II-XII grossly intact, Non focal.  UE DTE=B Psych:  TP linear. J/I WNL. Normal speech. Appropriate eye contact and affect.  Skin:  Oval hyperpigmented patches of skin in Christmas tree type distribution on lower legs and abdomen with herald pathc L abdomen.  No secondary infection  Data Review Lab Results  Component Value Date   HGBA1C 5.9 07/31/2016   HGBA1C 6.3 (H) 09/18/2012     Assessment & Plan     1. Ulnar neuropathy of left upper extremity Ace wraps at elbow 24/7 to decrease nerve inflammation - meloxicam (MOBIC) 15 MG tablet; Take 1 tablet (15 mg total) by mouth daily. X 2 weeks then prn  Dispense: 30 tablet; Refill: 0 - gabapentin (NEURONTIN) 300 MG capsule; Take 1 capsule (300 mg total) by mouth at bedtime.  Dispense: 30 capsule; Refill: 0  2. Pityriasis rosea Likely will resolve and can cancel appt.  In the event this does not go away and is not PR,  - Ambulatory referral to Dermatology He is getting financial packet today.     Patient have been counseled extensively about nutrition and exercise  Return in about 1 month (around 08/02/2017) for assign new pcp; f/up ulnar neuropathy and pityriasis rodea.  The patient was given clear instructions to go to ER or return to medical center if symptoms don't improve, worsen or new problems develop. The patient verbalized understanding. The patient was told to call to get lab results if they haven't heard anything in the next week.     Georgian Co, PA-C [facility] [place], [place] [phone]   07/02/2017, 9:51 AM

## 2017-07-02 NOTE — Patient Instructions (Signed)
Pityriasis Rosea Pityriasis rosea is a rash that usually appears on the trunk of the body. It may also appear on the upper arms and upper legs. It usually begins as a single patch, and then more patches begin to develop. The rash may cause mild itching, but it normally does not cause other problems. It usually goes away without treatment. However, it may take weeks or months for the rash to go away completely. What are the causes? The cause of this condition is not known. The condition does not spread from person to person (is noncontagious). What increases the risk? This condition is more likely to develop in young adults and children. It is most common in the spring and fall. What are the signs or symptoms? The main symptom of this condition is a rash.  The rash usually begins with a single oval patch that is larger than the ones that follow. This is called a herald patch. It generally appears a week or more before the rest of the rash appears.  When more patches start to develop, they spread quickly on the trunk, back, and arms. These patches are smaller than the first one.  The patches that make up the rash are usually oval-shaped and pink or red in color. They are usually flat, but they may sometimes be raised so that they can be felt with a finger. They may also be finely crinkled and have a scaly ring around the edge.  The rash does not typically appear on areas of the skin that are exposed to the sun.  Most people who have this condition do not have other symptoms, but some have mild itching. In a few cases, a mild headache or body aches may occur before the rash appears and then go away. How is this diagnosed? Your health care provider may diagnose this condition by doing a physical exam and taking your medical history. To rule out other possible causes for the rash, the health care provider may order blood tests or take a skin sample from the rash to be looked at under a microscope. How  is this treated? Usually, treatment is not needed for this condition. The rash will probably go away on its own in 4-8 weeks. In some cases, a health care provider may recommend or prescribe medicine to reduce itching. Follow these instructions at home:  Take medicines only as directed by your health care provider.  Avoid scratching the affected areas of skin.  Do not take hot baths or use a sauna. Use only warm water when bathing or showering. Heat can increase itching. Contact a health care provider if:  Your rash does not go away in 8 weeks.  Your rash gets much worse.  You have a fever.  You have swelling or pain in the rash area.  You have fluid, blood, or pus coming from the rash area. This information is not intended to replace advice given to you by your health care provider. Make sure you discuss any questions you have with your health care provider. Document Released: 10/23/2001 Document Revised: 02/22/2016 Document Reviewed: 08/24/2014 Elsevier Interactive Patient Education  2018 [employer] Cubital Tunnel Syndrome Cubital tunnel syndrome is a condition that causes pain and weakness of the forearm and hand. This condition happens when one of the nerves (ulnar nerve) that runs alongside the elbow joint becomes irritated. What are the causes? Causes of this condition include:  Increased pressure on the ulnar nerve at the elbow, arm, or forearm. This  can be caused by: ? Swollen tissues. ? Ligaments. ? Muscles. ? Poorly healed elbow fractures. ? Tumors in the elbow. These are usually noncancerous (benign). ? Scar tissue that develops in the elbow after an injury. ? Bony growths (spurs) near the ulnar nerve.  Stretching of the nerve due to loose elbow ligaments.  Trauma to the nerve at the elbow.  Repetitive elbow bending.  Certain medical conditions.  What increases the risk? This condition is more likely to develop in:  People who do manual labor that  requires frequently bending the elbow.  People who play sports that include repeated or strenuous throwing motions, such as baseball.  People who play contact sports, such as football or lacrosse.  People who do not warm up properly before activities.  People who have diabetes.  People who have an underactive thyroid (hypothyroidism).  What are the signs or symptoms? Symptoms of this condition include:  Clumsiness or weakness of the hand.  Tenderness of the inner elbow.  Aching or soreness of the inner elbow, forearm, or fingers, especially the little finger or the ring finger.  Increased pain with forced elbow bending.  Reduced control when throwing.  Tingling, numbness, or burning inside the forearm, or in part of the hand or fingers, especially the little finger or the ring finger.  Sharp pains that shoot from the elbow down to the wrist and hand.  The inability to grip or pinch hard.  How is this diagnosed? This condition is diagnosed with a medical history and physical exam. Your health care provider will ask about your symptoms and ask for details about any injury. You may also have other tests, including:  Electromyogram (EMG). This test checks how well the nerve is working.  X-ray.  How is this treated? Treatment starts by stopping the activities that are causing your symptoms to get worse. Treatment may include the use of icing and medicines to reduce pain and swelling. You may also be advised to wear a splint to prevent your elbow from bending or wear an elbow pad where the ulnar nerve is closest to the skin. In less severe cases, treatment may also include working with a physical therapist:  To help decrease your symptoms.  To improve the strength and range of motion of your elbow, forearm, and hand.  If the treatments described above do not help, surgery may be needed. Follow these instructions at home: If you have a splint:  Wear it as told by your health  care provider. Remove it only as told by your health care provider.  Loosen the splint if your fingers become numb and tingle, or if they turn cold and blue.  Keep the splint clean and dry. Managing pain, stiffness, and swelling  If directed, apply ice to the injured area: ? Put ice in a plastic bag. ? Place a towel between your skin and the bag. ? Leave the ice on for 20 minutes, 2-3 times per day.  Move your fingers often to avoid stiffness and to lessen swelling.  Raise (elevate) the injured area above the level of your heart while you are sitting or lying down. General instructions  Take over-the-counter and prescription medicines only as told by your health care provider.  Keep all follow-up visits as told by your health care provider. This is important.  Do any exercise or physical therapy as told by your health care provider.  Do not drive or operate heavy machinery while taking prescription pain medicine.  If  you were given an elbow pad, wear it as told by your health care provider. Contact a health care provider if:  Your symptoms get worse.  Your symptoms do not get better with treatment.  Your have new pain.  Your hand on the injured side feels numb or cold. This information is not intended to replace advice given to you by your health care provider. Make sure you discuss any questions you have with your health care provider. Document Released: 09/16/2005 Document Revised: 02/22/2016 Document Reviewed: 11/23/2014 Elsevier Interactive Patient Education  [employer].

## 2017-07-21 MED FILL — MELOXICAM 15 MG TABLET: 15 | 30 days supply | Qty: 30 | Fill #0

## 2017-07-21 MED FILL — GABAPENTIN 300 MG CAPSULE: 300 | 30 days supply | Qty: 30 | Fill #0

## 2017-07-30 ENCOUNTER — Encounter: Payer: Self-pay | Admitting: Family Medicine

## 2017-07-31 NOTE — Telephone Encounter (Signed)
Patient email response

## 2017-08-19 ENCOUNTER — Encounter: Payer: Self-pay | Admitting: Family Medicine

## 2017-08-19 ENCOUNTER — Ambulatory Visit: Payer: Self-pay | Attending: Family Medicine | Admitting: Family Medicine

## 2017-08-19 VITALS — BP 121/78 | HR 78 | Temp 98.6°F | Resp 18 | Ht 66.0 in | Wt 174.0 lb

## 2017-08-19 DIAGNOSIS — L309 Dermatitis, unspecified: Secondary | ICD-10-CM | POA: Insufficient documentation

## 2017-08-19 DIAGNOSIS — L300 Nummular dermatitis: Secondary | ICD-10-CM

## 2017-08-19 DIAGNOSIS — G5622 Lesion of ulnar nerve, left upper limb: Secondary | ICD-10-CM

## 2017-08-19 DIAGNOSIS — Z79899 Other long term (current) drug therapy: Secondary | ICD-10-CM | POA: Insufficient documentation

## 2017-08-19 DIAGNOSIS — G5620 Lesion of ulnar nerve, unspecified upper limb: Secondary | ICD-10-CM | POA: Insufficient documentation

## 2017-08-19 MED ORDER — MELOXICAM 15 MG PO TABS: 15.0000 mg | ORAL_TABLET | Freq: Every day | ORAL | 0 refills | Status: DC

## 2017-08-19 MED ORDER — HYDROCORTISONE 2.5 % EX CREA: TOPICAL_CREAM | Freq: Two times a day (BID) | CUTANEOUS | 0 refills | Status: DC

## 2017-08-19 MED ORDER — GABAPENTIN 300 MG PO CAPS: 300.0000 mg | ORAL_CAPSULE | Freq: Every day | ORAL | 0 refills | Status: DC

## 2017-08-19 NOTE — Progress Notes (Signed)
Subjective:  Patient ID: Erik SimmeringSammie Lachney, male    DOB: November 11, 1955  Age: 61 y.o. MRN: [id number]  CC: Numbness   HPI Aniruddh Ehlert presents for complaints of paresthesias and skin lesions.  Paresthesias: Onset since March.  Symptoms include numbness to fourth and fifth digits of left hand.  He denies any decreased grip or repetitive movements.  He denies taking anything for symptoms.  He reports symptoms have been progressive starting from fifth digit of left hand and extending down ulnar side of left hand and wrist.  Skin lesions: Onset since March.  He reports lesions in bilateral legs and left forearm and abdomen.  Associated symptoms include mild pruritus. He denies any drainage.    Outpatient Medications Prior to Visit  Medication Sig Dispense Refill  . fluticasone (FLONASE) 50 MCG/ACT nasal spray Place 2 sprays into both nostrils daily. (Patient not taking: Reported on 07/02/2017) 16 g 6  . loratadine (CLARITIN) 10 MG tablet Take 1 tablet (10 mg total) by mouth daily. (Patient not taking: Reported on 07/02/2017) 30 tablet 11  . sodium chloride (OCEAN) 0.65 % SOLN nasal spray Place 1 spray into both nostrils as needed for congestion. (Patient not taking: Reported on 12/24/2016) 1 Bottle 0  . Vitamin D, Ergocalciferol, (DRISDOL) 50000 units CAPS capsule Take 1 capsule (50,000 Units total) by mouth every 7 (seven) days. (Patient not taking: Reported on 07/02/2017) 12 capsule 0  . gabapentin (NEURONTIN) 300 MG capsule Take 1 capsule (300 mg total) by mouth at bedtime. 30 capsule 0  . meloxicam (MOBIC) 15 MG tablet Take 1 tablet (15 mg total) by mouth daily. X 2 weeks then prn 30 tablet 0   No facility-administered medications prior to visit.     ROS Review of Systems  Constitutional: Negative.   Respiratory: Negative.   Cardiovascular: Negative.   Musculoskeletal: Positive for myalgias.  Skin: Positive for rash.  Neurological:       Paresthesias.    Objective:  BP 121/78 (BP  Location: Left Arm, Patient Position: Sitting, Cuff Size: Normal)   Pulse 78   Temp 98.6 F (37 C) (Oral)   Resp 18   Ht 5\' 6"  (1.676 m)   Wt 174 lb (78.9 kg)   SpO2 94%   BMI 28.08 kg/m   BP/Weight 08/19/2017 07/02/2017 12/24/2016  Systolic BP 121 126 114  Diastolic BP 78 90 82  Wt. (Lbs) 174 166.2 162  BMI 28.08 26.83 26.15     Physical Exam  Constitutional: He appears well-developed and well-nourished.  Cardiovascular: Normal rate, regular rhythm, normal heart sounds and intact distal pulses.  Pulmonary/Chest: Effort normal and breath sounds normal.  Abdominal: Soft. Bowel sounds are normal.  Musculoskeletal:       Left hand: Decreased sensation (Paresthesias extending from fifth digit of left hand down the ulnar side of hand and wrist.) noted. Decreased strength noted.  Skin: Skin is warm and dry. Lesion (bilateral lower legs and abdomen, rounded, hyperpigmented pruritic.) noted.  Psychiatric: His mood appears anxious. He is agitated. He expresses no homicidal and no suicidal ideation. He expresses no suicidal plans and no homicidal plans.  Nursing note and vitals reviewed.    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 - gabapentin (NEURONTIN) 300 MG capsule; Take 1 capsule (300 mg total) by mouth at bedtime.  Dispense: 30 capsule; Refill: 0 - meloxicam (MOBIC) 15 MG tablet; Take 1 tablet (15 mg total) by  mouth daily. X 2 weeks then prn  Dispense: 30 tablet; Refill: 0 - Ambulatory referral to Orthopedics  2. Nummular eczema  - Ambulatory referral to Dermatology - hydrocortisone 2.5 % cream; Apply topically 2 (two) times daily. Apply to affected areas.  Dispense: 30 g; Refill: 0     Follow-up: Return if symptoms worsen or fail to improve.   Lizbeth BarkMandesia R Zaray Gatchel FNP

## 2017-08-19 NOTE — Patient Instructions (Addendum)
Eczema Eczema, also called atopic dermatitis, is a skin disorder that causes inflammation of the skin. It causes a red rash and dry, scaly skin. The skin becomes very itchy. Eczema is generally worse during the cooler winter months and often improves with the warmth of summer. Eczema usually starts showing signs in infancy. Some children outgrow eczema, but it may last through adulthood. What are the causes? The exact cause of eczema is not known, but it appears to run in families. People with eczema often have a family history of eczema, allergies, asthma, or hay fever. Eczema is not contagious. Flare-ups of the condition may be caused by:  Contact with something you are sensitive or allergic to.  Stress.  What are the signs or symptoms?  Dry, scaly skin.  Red, itchy rash.  Itchiness. This may occur before the skin rash and may be very intense. How is this diagnosed? The diagnosis of eczema is usually made based on symptoms and medical history. How is this treated? Eczema cannot be cured, but symptoms usually can be controlled with treatment and other strategies. A treatment plan might include:  Controlling the itching and scratching. ? Use over-the-counter antihistamines as directed for itching. This is especially useful at night when the itching tends to be worse. ? Use over-the-counter steroid creams as directed for itching. ? Avoid scratching. Scratching makes the rash and itching worse. It may also result in a skin infection (impetigo) due to a break in the skin caused by scratching.  Keeping the skin well moisturized with creams every day. This will seal in moisture and help prevent dryness. Lotions that contain alcohol and water should be avoided because they can dry the skin.  Limiting exposure to things that you are sensitive or allergic to (allergens).  Recognizing situations that cause stress.  Developing a plan to manage stress.  Follow these instructions at  home:  Only take over-the-counter or prescription medicines as directed by your health care provider.  Do not use anything on the skin without checking with your health care provider.  Keep baths or showers short (5 minutes) in warm (not hot) water. Use mild cleansers for bathing. These should be unscented. You may add nonperfumed bath oil to the bath water. It is best to avoid soap and bubble bath.  Immediately after a bath or shower, when the skin is still damp, apply a moisturizing ointment to the entire body. This ointment should be a petroleum ointment. This will seal in moisture and help prevent dryness. The thicker the ointment, the better. These should be unscented.  Keep fingernails cut short. Children with eczema may need to wear soft gloves or mittens at night after applying an ointment.  Dress in clothes made of cotton or cotton blends. Dress lightly, because heat increases itching.  A child with eczema should stay away from anyone with fever blisters or cold sores. The virus that causes fever blisters (herpes simplex) can cause a serious skin infection in children with eczema. Contact a health care provider if:  Your itching interferes with sleep.  Your rash gets worse or is not better within 1 week after starting treatment.  You see pus or soft yellow scabs in the rash area.  You have a fever.  You have a rash flare-up after contact with someone who has fever blisters. This information is not intended to replace advice given to you by your health care provider. Make sure you discuss any questions you have with your health care provider.   Document Released: 09/13/2000 Document Revised: 02/22/2016 Document Reviewed: 04/19/2013 Elsevier Interactive Patient Education  2017 [employer]  Gabapentin capsules or tablets What is this medicine? GABAPENTIN (GA ba pen tin) is used to control partial seizures in adults with epilepsy. It is also used to treat certain types of nerve  pain. This medicine may be used for other purposes; ask your health care provider or pharmacist if you have questions. COMMON BRAND NAME(S): Active-PAC with Gabapentin, Gabarone, Neurontin What should I tell my health care provider before I take this medicine? They need to know if you have any of these conditions: -kidney disease -suicidal thoughts, plans, or attempt; a previous suicide attempt by you or a family member -an unusual or allergic reaction to gabapentin, other medicines, foods, dyes, or preservatives -pregnant or trying to get pregnant -breast-feeding How should I use this medicine? Take this medicine by mouth with a glass of water. Follow the directions on the prescription label. You can take it with or without food. If it upsets your stomach, take it with food.Take your medicine at regular intervals. Do not take it more often than directed. Do not stop taking except on your doctor's advice. If you are directed to break the 600 or 800 mg tablets in half as part of your dose, the extra half tablet should be used for the next dose. If you have not used the extra half tablet within 28 days, it should be thrown away. A special MedGuide will be given to you by the pharmacist with each prescription and refill. Be sure to read this information carefully each time. Talk to your pediatrician regarding the use of this medicine in children. Special care may be needed. Overdosage: If you think you have taken too much of this medicine contact a poison control center or emergency room at once. NOTE: This medicine is only for you. Do not share this medicine with others. What if I miss a dose? If you miss a dose, take it as soon as you can. If it is almost time for your next dose, take only that dose. Do not take double or extra doses. What may interact with this medicine? Do not take this medicine with any of the following medications: -other gabapentin products This medicine may also interact  with the following medications: -alcohol -antacids -antihistamines for allergy, cough and cold -certain medicines for anxiety or sleep -certain medicines for depression or psychotic disturbances -homatropine; hydrocodone -naproxen -narcotic medicines (opiates) for pain -phenothiazines like chlorpromazine, mesoridazine, prochlorperazine, thioridazine This list may not describe all possible interactions. Give your health care provider a list of all the medicines, herbs, non-prescription drugs, or dietary supplements you use. Also tell them if you smoke, drink alcohol, or use illegal drugs. Some items may interact with your medicine. What should I watch for while using this medicine? Visit your doctor or health care professional for regular checks on your progress. You may want to keep a record at home of how you feel your condition is responding to treatment. You may want to share this information with your doctor or health care professional at each visit. You should contact your doctor or health care professional if your seizures get worse or if you have any new types of seizures. Do not stop taking this medicine or any of your seizure medicines unless instructed by your doctor or health care professional. Stopping your medicine suddenly can increase your seizures or their severity. Wear a medical identification bracelet or chain if you are  taking this medicine for seizures, and carry a card that lists all your medications. You may get drowsy, dizzy, or have blurred vision. Do not drive, use machinery, or do anything that needs mental alertness until you know how this medicine affects you. To reduce dizzy or fainting spells, do not sit or stand up quickly, especially if you are an older patient. Alcohol can increase drowsiness and dizziness. Avoid alcoholic drinks. Your mouth may get dry. Chewing sugarless gum or sucking hard candy, and drinking plenty of water will help. The use of this medicine may  increase the chance of suicidal thoughts or actions. Pay special attention to how you are responding while on this medicine. Any worsening of mood, or thoughts of suicide or dying should be reported to your health care professional right away. Women who become pregnant while using this medicine may enroll in the [place] [employer] Antiepileptic Drug Pregnancy Registry by calling [phone]. This registry collects information about the safety of antiepileptic drug use during pregnancy. What side effects may I notice from receiving this medicine? Side effects that you should report to your doctor or health care professional as soon as possible: -allergic reactions like skin rash, itching or hives, swelling of the face, lips, or tongue -worsening of mood, thoughts or actions of suicide or dying Side effects that usually do not require medical attention (report to your doctor or health care professional if they continue or are bothersome): -constipation -difficulty walking or controlling muscle movements -dizziness -nausea -slurred speech -tiredness -tremors -weight gain This list may not describe all possible side effects. Call your doctor for medical advice about side effects. You may report side effects to FDA at 1-800-FDA-1088. Where should I keep my medicine? Keep out of reach of children. This medicine may cause accidental overdose and death if it taken by other adults, children, or pets. Mix any unused medicine with a substance like cat litter or coffee grounds. Then throw the medicine away in a sealed container like a sealed bag or a coffee can with a lid. Do not use the medicine after the expiration date. Store at room temperature between 15 and 30 degrees C (59 and 86 degrees F). NOTE: This sheet is a summary. It may not cover all possible information. If you have questions about this medicine, talk to your doctor, pharmacist, or health care provider.  2018 Elsevier/Gold Standard  (2013-11-12 15:26:50)  Meloxicam tablets What is this medicine? MELOXICAM (mel OX i cam) is a non-steroidal anti-inflammatory drug (NSAID). It is used to reduce swelling and to treat pain. It may be used for osteoarthritis, rheumatoid arthritis, or juvenile rheumatoid arthritis. This medicine may be used for other purposes; ask your health care provider or pharmacist if you have questions. COMMON BRAND NAME(S): Mobic What should I tell my health care provider before I take this medicine? They need to know if you have any of these conditions: -bleeding disorders -cigarette smoker -coronary artery bypass graft (CABG) surgery within the past 2 weeks -drink more than 3 alcohol-containing drinks per day -heart disease -high blood pressure -history of stomach bleeding -kidney disease -liver disease -lung or breathing disease, like asthma -stomach or intestine problems -an unusual or allergic reaction to meloxicam, aspirin, other NSAIDs, other medicines, foods, dyes, or preservatives -pregnant or trying to get pregnant -breast-feeding How should I use this medicine? Take this medicine by mouth with a full glass of water. Follow the directions on the prescription label. You can take it with or without food. If  it upsets your stomach, take it with food. Take your medicine at regular intervals. Do not take it more often than directed. Do not stop taking except on your doctor's advice. A special MedGuide will be given to you by the pharmacist with each prescription and refill. Be sure to read this information carefully each time. Talk to your pediatrician regarding the use of this medicine in children. While this drug may be prescribed for selected conditions, precautions do apply. Patients over 62 years old may have a stronger reaction and need a smaller dose. Overdosage: If you think you have taken too much of this medicine contact a poison control center or emergency room at once. NOTE: This  medicine is only for you. Do not share this medicine with others. What if I miss a dose? If you miss a dose, take it as soon as you can. If it is almost time for your next dose, take only that dose. Do not take double or extra doses. What may interact with this medicine? Do not take this medicine with any of the following medications: -cidofovir -ketorolac This medicine may also interact with the following medications: -aspirin and aspirin-like medicines -certain medicines for blood pressure, heart disease, irregular heart beat -certain medicines for depression, anxiety, or psychotic disturbances -certain medicines that treat or prevent blood clots like warfarin, enoxaparin, dalteparin, apixaban, dabigatran, rivaroxaban -cyclosporine -digoxin -diuretics -methotrexate -other NSAIDs, medicines for pain and inflammation, like ibuprofen and naproxen -pemetrexed This list may not describe all possible interactions. Give your health care provider a list of all the medicines, herbs, non-prescription drugs, or dietary supplements you use. Also tell them if you smoke, drink alcohol, or use illegal drugs. Some items may interact with your medicine. What should I watch for while using this medicine? Tell your doctor or healthcare professional if your symptoms do not start to get better or if they get worse. Do not take other medicines that contain aspirin, ibuprofen, or naproxen with this medicine. Side effects such as stomach upset, nausea, or ulcers may be more likely to occur. Many medicines available without a prescription should not be taken with this medicine. This medicine can cause ulcers and bleeding in the stomach and intestines at any time during treatment. This can happen with no warning and may cause death. There is increased risk with taking this medicine for a long time. Smoking, drinking alcohol, older age, and poor health can also increase risks. Call your doctor right away if you have  stomach pain or blood in your vomit or stool. This medicine does not prevent heart attack or stroke. In fact, this medicine may increase the chance of a heart attack or stroke. The chance may increase with longer use of this medicine and in people who have heart disease. If you take aspirin to prevent heart attack or stroke, talk with your doctor or health care professional. What side effects may I notice from receiving this medicine? Side effects that you should report to your doctor or health care professional as soon as possible: -allergic reactions like skin rash, itching or hives, swelling of the face, lips, or tongue -nausea, vomiting -signs and symptoms of a blood clot such as breathing problems; changes in vision; chest pain; severe, sudden headache; pain, swelling, warmth in the leg; trouble speaking; sudden numbness or weakness of the face, arm, or leg -signs and symptoms of bleeding such as bloody or black, tarry stools; red or dark-brown urine; spitting up blood or brown material that looks  like coffee grounds; red spots on the skin; unusual bruising or bleeding from the eye, gums, or nose -signs and symptoms of liver injury like dark yellow or brown urine; general ill feeling or flu-like symptoms; light-colored stools; loss of appetite; nausea; right upper belly pain; unusually weak or tired; yellowing of the eyes or skin -signs and symptoms of stroke like changes in vision; confusion; trouble speaking or understanding; severe headaches; sudden numbness or weakness of the face, arm, or leg; trouble walking; dizziness; loss of balance or coordination Side effects that usually do not require medical attention (report to your doctor or health care professional if they continue or are bothersome): -constipation -diarrhea -gas This list may not describe all possible side effects. Call your doctor for medical advice about side effects. You may report side effects to FDA at 1-800-FDA-1088. Where  should I keep my medicine? Keep out of the reach of children. Store at room temperature between 15 and 30 degrees C (59 and 86 degrees F). Throw away any unused medicine after the expiration date. NOTE: This sheet is a summary. It may not cover all possible information. If you have questions about this medicine, talk to your doctor, pharmacist, or health care provider.  2018 Elsevier/Gold Standard (2015-10-18 19:28:16)

## 2017-09-15 MED FILL — MELOXICAM 15 MG TABLET: 15 | 30 days supply | Qty: 30 | Fill #0

## 2017-09-15 MED FILL — GABAPENTIN 300 MG CAPSULE: 300 | 30 days supply | Qty: 30 | Fill #0

## 2017-09-15 MED FILL — HYDROCORTISONE 2.5% CREAM: 2.5 | 30 days supply | Qty: 30 | Fill #0

## 2017-12-12 ENCOUNTER — Encounter: Payer: Self-pay | Admitting: *Deleted

## 2017-12-12 NOTE — Progress Notes (Signed)
Patient reported to the clinic to "report and adverse reaction" to a tdap injection he received. MA reviewed patients chart and noted that tdap was administered on 12/24/16. Patient received VIS in office. Patient reported back to the clinic on 07/02/17 for skin concerns and reported the tdap reaction at that time. The provider documented patients report and treated him for the acute concerns that were present. Patient emailed on 07/30/17 regarding the "adverse reaction" to ensure it was documented in his chart. Patients Mychart message was routed to current PCP. Patient was seen in office to reestablish on 08/19/17 and also reports the concern and was treated accordingly. Patient was referred to dermatology which patient admits to not following up with. Patient was informed of documentation being noted at each visit. Patient was also advised to not wait months in between concerns. Clinically a reaction needs to be evaluated as close to the "event" as possible. Patient is aware of MA adding tdap to the allergen list per his statements and request. No further questions or concerns at this time.

## 2018-01-02 ENCOUNTER — Ambulatory Visit: Payer: Medicaid Other | Admitting: Nurse Practitioner

## 2018-02-04 ENCOUNTER — Ambulatory Visit (INDEPENDENT_AMBULATORY_CARE_PROVIDER_SITE_OTHER): Payer: Medicaid Other | Admitting: Nurse Practitioner

## 2018-02-04 ENCOUNTER — Ambulatory Visit: Payer: Medicaid Other | Admitting: Nurse Practitioner

## 2018-02-25 ENCOUNTER — Other Ambulatory Visit: Payer: Self-pay

## 2018-02-25 ENCOUNTER — Encounter (INDEPENDENT_AMBULATORY_CARE_PROVIDER_SITE_OTHER): Payer: Self-pay | Admitting: Nurse Practitioner

## 2018-02-25 ENCOUNTER — Ambulatory Visit (INDEPENDENT_AMBULATORY_CARE_PROVIDER_SITE_OTHER): Payer: Self-pay | Admitting: Nurse Practitioner

## 2018-02-25 VITALS — BP 124/86 | HR 82 | Temp 98.6°F | Ht 66.0 in | Wt 169.4 lb

## 2018-02-25 DIAGNOSIS — R7303 Prediabetes: Secondary | ICD-10-CM

## 2018-02-25 DIAGNOSIS — G5692 Unspecified mononeuropathy of left upper limb: Secondary | ICD-10-CM

## 2018-02-25 DIAGNOSIS — J309 Allergic rhinitis, unspecified: Secondary | ICD-10-CM

## 2018-02-25 LAB — POCT GLYCOSYLATED HEMOGLOBIN (HGB A1C): Hemoglobin A1C: 5.7 % — AB (ref 4.0–5.6)

## 2018-02-25 MED ORDER — LORATADINE 10 MG PO TABS: 10.0000 mg | ORAL_TABLET | Freq: Every day | ORAL | 11 refills | Status: DC

## 2018-02-25 MED ORDER — FLUTICASONE PROPIONATE 50 MCG/ACT NA SUSP: 2.0000 | Freq: Every day | NASAL | 6 refills | Status: DC

## 2018-02-25 MED FILL — FLUTICASONE PROP 50 MCG SPR: 50 | 30 days supply | Qty: 16 | Fill #0

## 2018-02-25 NOTE — Patient Instructions (Signed)
Neuropathic Pain Neuropathic pain is pain caused by damage to the nerves that are responsible for certain sensations in your body (sensory nerves). The pain can be caused by damage to:  The sensory nerves that send signals to your spinal cord and brain (peripheral nervous system).  The sensory nerves in your brain or spinal cord (central nervous system).  Neuropathic pain can make you more sensitive to pain. What would be a minor sensation for most people may feel very painful if you have neuropathic pain. This is usually a long-term condition that can be difficult to treat. The type of pain can differ from person to person. It may start suddenly (acute), or it may develop slowly and last for a long time (chronic). Neuropathic pain may come and go as damaged nerves heal or may stay at the same level for years. It often causes emotional distress, loss of sleep, and a lower quality of life. What are the causes? The most common cause of damage to a sensory nerve is diabetes. Many other diseases and conditions can also cause neuropathic pain. Causes of neuropathic pain can be classified as:  Toxic. Many drugs and chemicals can cause toxic damage. The most common cause of toxic neuropathic pain is damage from drug treatment for cancer (chemotherapy).  Metabolic. This type of pain can happen when a disease causes imbalances that damage nerves. Diabetes is the most common of these diseases. Vitamin B deficiency caused by long-term alcohol abuse is another common cause.  Traumatic. Any injury that cuts, crushes, or stretches a nerve can cause damage and pain. A common example is feeling pain after losing an arm or leg (phantom limb pain).  Compression-related. If a sensory nerve gets trapped or compressed for a long period of time, the blood supply to the nerve can be cut off.  Vascular. Many blood vessel diseases can cause neuropathic pain by decreasing blood supply and oxygen to nerves.  Autoimmune.  This type of pain results from diseases in which the body's defense system mistakenly attacks sensory nerves. Examples of autoimmune diseases that can cause neuropathic pain include lupus and multiple sclerosis.  Infectious. Many types of viral infections can damage sensory nerves and cause pain. Shingles infection is a common cause of this type of pain.  Inherited. Neuropathic pain can be a symptom of many diseases that are passed down through families (genetic).  What are the signs or symptoms? The main symptom is pain. Neuropathic pain is often described as:  Burning.  Shock-like.  Stinging.  Hot or cold.  Itching.  How is this diagnosed? No single test can diagnose neuropathic pain. Your health care provider will do a physical exam and ask you about your pain. You may use a pain scale to describe how bad your pain is. You may also have tests to see if you have a high sensitivity to pain and to help find the cause and location of any sensory nerve damage. These tests may include:  Imaging studies, such as: ? X-rays. ? CT scan. ? MRI.  Nerve conduction studies to test how well nerve signals travel through your sensory nerves (electrodiagnostic testing).  Stimulating your sensory nerves through electrodes on your skin and measuring the response in your spinal cord and brain (somatosensory evoked potentials).  How is this treated? Treatment for neuropathic pain may change over time. You may need to try different treatment options or a combination of treatments. Some options include:  Over-the-counter pain relievers.  Prescription medicines. Some medicines   used to treat other conditions may also help neuropathic pain. These include medicines to: ? Control seizures (anticonvulsants). ? Relieve depression (antidepressants).  Prescription-strength pain relievers (narcotics). These are usually used when other pain relievers do not help.  Transcutaneous nerve stimulation (TENS).  This uses electrical currents to block painful nerve signals. The treatment is painless.  Topical and local anesthetics. These are medicines that numb the nerves. They can be injected as a nerve block or applied to the skin.  Alternative treatments, such as: ? Acupuncture. ? Meditation. ? Massage. ? Physical therapy. ? Pain management programs. ? Counseling.  Follow these instructions at home:  Learn as much as you can about your condition.  Take medicines only as directed by your health care provider.  Work closely with all your health care providers to find what works best for you.  Have a good support system at home.  Consider joining a chronic pain support group. Contact a health care provider if:  Your pain treatments are not helping.  You are having side effects from your medicines.  You are struggling with fatigue, mood changes, depression, or anxiety. This information is not intended to replace advice given to you by your health care provider. Make sure you discuss any questions you have with your health care provider. Document Released: 06/13/2004 Document Revised: 04/05/2016 Document Reviewed: 02/24/2014 Elsevier Interactive Patient Education  2018 [employer] Inc.  

## 2018-02-25 NOTE — Progress Notes (Signed)
Assessment & Plan:  Erik Ward was seen today for new patient (initial visit).  Diagnoses and all orders for this visit:  Neuropathy of finger of left hand Declined any medication today. Needs to speak with financial counselor   Prediabetes -     HgB A1c INSTRUCTIONS: Work on a low fat, heart healthy diet and participate in regular aerobic exercise program by working out at least 150 minutes per week. No fried foods. No junk foods, sodas, sugary drinks, unhealthy snacking, alcohol or smoking.    Allergic rhinitis, unspecified seasonality, unspecified trigger -     loratadine (CLARITIN) 10 MG tablet; Take 1 tablet (10 mg total) by mouth daily. -     fluticasone (FLONASE) 50 MCG/ACT nasal spray; Place 2 sprays into both nostrils daily.   Patient has been counseled on age-appropriate routine health concerns for screening and prevention. These are reviewed and up-to-date. Referrals have been placed accordingly. Immunizations are up-to-date or declined.    Subjective:   Chief Complaint  Patient presents with  . New Patient (Initial Visit)    numbness in fingers    HPI Erik Ward 62 y.o. male presents to office today to establish care.   Allergic Rhinitis  Erik Ward is here for evaluation of possible allergic rhinitis. Patient's symptoms include clear rhinorrhea, cough, nasal congestion, postnasal drip, sinus pressure and sneezing. These symptoms are seasonal. Current triggers include exposure to no known precipitant. The patient has been suffering from these symptoms for approximately several weeks. The patient has tried nothing. Immunotherapy has never been tried. The patient has never had nasal polyps. The patient has no history of asthma. The patient does not suffer from frequent sinopulmonary infections. The patient has not had sinus surgery in the past. The patient has no history of eczema.   Prediabetes Well controlled. Down from 5.9 to 5.7. We discussed dietary  modifications.  Lab Results  Component Value Date   HGBA1C 5.7 (A) 02/25/2018   Neuropathy  He describes symptoms of numbness and tingling. Onset of symptoms was several after receiving a tetanus and flu vaccine simultaneously. Symptoms are currently of moderate severity. Symptoms occur all day and last all day. The patient denies lancinating pain, cramping, squeezing and hypersensitivity. Symptoms are between the left ring and pinky finger. Previous treatment has included gabapentin and Mobic, which has not improved symptoms.  He was referred to an orthopedic specialist howeve he did not have insurance at that time and reports today that he still has not applied for the financial assistance program.  I instructed him in order to effectively worked up the etiology of the numbness and tingling he will need to apply for the in-house insurance so that he can be referred and possibly have imaging performed.  He verbalized understanding.  Review of Systems  Constitutional: Negative for fever, malaise/fatigue and weight loss.  HENT: Positive for congestion. Negative for nosebleeds.   Eyes: Negative.  Negative for blurred vision, double vision and photophobia.  Respiratory: Negative.  Negative for cough and shortness of breath.   Cardiovascular: Negative.  Negative for chest pain, palpitations and leg swelling.  Gastrointestinal: Negative.  Negative for heartburn, nausea and vomiting.  Musculoskeletal: Negative.  Negative for myalgias.  Neurological: Positive for tingling and sensory change. Negative for dizziness, focal weakness, seizures and headaches.  Endo/Heme/Allergies: Positive for environmental allergies.  Psychiatric/Behavioral: Negative.  Negative for suicidal ideas.    Past Medical History:  Diagnosis Date  . Subarachnoid hemorrhage following injury, with loss of consciousness (HCC)  09/18/2012   syncope and collapse (09/18/2012)  . Syncope and collapse 09/18/2012   w/loss of  consciousness (09/18/2012)    Past Surgical History:  Procedure Laterality Date  . PERCUTANEOUS PINNING PHALANX FRACTURE OF HAND  1980's   "right" (09/18/2012)    No family history on file.  Social History Reviewed with no changes to be made today.   Outpatient Medications Prior to Visit  Medication Sig Dispense Refill  . fluticasone (FLONASE) 50 MCG/ACT nasal spray Place 2 sprays into both nostrils daily. (Patient not taking: Reported on 07/02/2017) 16 g 6  . gabapentin (NEURONTIN) 300 MG capsule Take 1 capsule (300 mg total) by mouth at bedtime. (Patient not taking: Reported on 02/25/2018) 30 capsule 0  . hydrocortisone 2.5 % cream Apply topically 2 (two) times daily. Apply to affected areas. (Patient not taking: Reported on 02/25/2018) 30 g 0  . loratadine (CLARITIN) 10 MG tablet Take 1 tablet (10 mg total) by mouth daily. (Patient not taking: Reported on 07/02/2017) 30 tablet 11  . meloxicam (MOBIC) 15 MG tablet Take 1 tablet (15 mg total) by mouth daily. X 2 weeks then prn (Patient not taking: Reported on 02/25/2018) 30 tablet 0  . sodium chloride (OCEAN) 0.65 % SOLN nasal spray Place 1 spray into both nostrils as needed for congestion. (Patient not taking: Reported on 12/24/2016) 1 Bottle 0  . Vitamin D, Ergocalciferol, (DRISDOL) 50000 units CAPS capsule Take 1 capsule (50,000 Units total) by mouth every 7 (seven) days. (Patient not taking: Reported on 07/02/2017) 12 capsule 0   No facility-administered medications prior to visit.     Allergies  Allergen Reactions  . Tdap [Tetanus-Diphth-Acell Pertussis]     Patient reports breaking out in "lesions"       Objective:    BP 124/86 (BP Location: Right Arm, Patient Position: Sitting, Cuff Size: Normal)   Pulse 82   Temp 98.6 F (37 C) (Oral)   Ht  (1.676 m)   Wt 169 lb 6.4 oz (76.8 kg)   SpO2 94%   BMI 27.34 kg/m  Wt Readings from Last 3 Encounters:  02/25/18 169 lb 6.4 oz (76.8 kg)  08/19/17 174 lb (78.9 kg)  07/02/17  166 lb 3.2 oz (75.4 kg)    Physical Exam  Constitutional: He is oriented to person, place, and time. He appears well-developed and well-nourished. He is cooperative.  HENT:  Head: Normocephalic and atraumatic.  Right Ear: Tympanic membrane and ear canal normal.  Left Ear: Tympanic membrane and ear canal normal.  Nose: Mucosal edema and rhinorrhea present.  Mouth/Throat: Uvula is midline, oropharynx is clear and moist and mucous membranes are normal.  Eyes: EOM are normal.  Neck: Normal range of motion.  Cardiovascular: Normal rate, regular rhythm, normal heart sounds and intact distal pulses. Exam reveals no gallop and no friction rub.  No murmur heard. Pulmonary/Chest: Effort normal and breath sounds normal. No tachypnea. No respiratory distress. He has no decreased breath sounds. He has no wheezes. He has no rhonchi. He has no rales. He exhibits no tenderness.  Abdominal: Soft. Bowel sounds are normal.  Musculoskeletal: Normal range of motion. He exhibits no edema.       Left hand: He exhibits no tenderness, no bony tenderness, normal two-point discrimination, normal capillary refill, no deformity, no laceration and no swelling. Decreased sensation noted. Decreased sensation is present in the ulnar distribution. Normal strength noted.       Hands: Neurological: He is alert and oriented to person, place,  and time. Coordination normal.  Skin: Skin is warm and dry.  Psychiatric: He has a normal mood and affect. His behavior is normal. Judgment and thought content normal.  Nursing note and vitals reviewed.        Patient has been counseled extensively about nutrition and exercise as well as the importance of adherence with medications and regular follow-up. The patient was given clear instructions to go to ER or return to medical center if symptoms don't improve, worsen or new problems develop. The patient verbalized understanding.   Follow-up: Return in about 6 weeks (around 04/08/2018)  for Needs appointment with financial representative.Claiborne Rigg, FNP-BC [facility] [place], [place] [phone]   02/25/2018, 5:17 PM

## 2018-04-08 ENCOUNTER — Ambulatory Visit (INDEPENDENT_AMBULATORY_CARE_PROVIDER_SITE_OTHER): Payer: Medicaid Other | Admitting: Physician Assistant

## 2018-07-07 ENCOUNTER — Encounter: Payer: Self-pay | Admitting: Family Medicine

## 2018-07-07 ENCOUNTER — Ambulatory Visit (INDEPENDENT_AMBULATORY_CARE_PROVIDER_SITE_OTHER): Payer: Self-pay | Admitting: Family Medicine

## 2018-07-07 VITALS — BP 117/78 | HR 76 | Temp 98.2°F | Resp 17 | Ht 70.0 in | Wt 158.0 lb

## 2018-07-07 DIAGNOSIS — G5692 Unspecified mononeuropathy of left upper limb: Secondary | ICD-10-CM

## 2018-07-07 DIAGNOSIS — T148XXA Other injury of unspecified body region, initial encounter: Secondary | ICD-10-CM
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
## 2018-07-07 NOTE — Patient Instructions (Signed)
Thank you for choosing Primary Care at [facility] for your medical home!    Wayde Merten was seen by [name], FNP today.   Areg Piacentini's primary care doctor is Bing Neighbors, FNP.   For the best care possible,  you should try to see [name], FNP  whenever you come to clinic.   We look forward to seeing you again soon!  If you have any questions about your visit today,  please call us at   Or feel free to reach your provider via MyChart.

## 2018-07-08 ENCOUNTER — Ambulatory Visit: Payer: Self-pay | Attending: Family Medicine

## 2018-07-08 DIAGNOSIS — T148XXA Other injury of unspecified body region, initial encounter: Secondary | ICD-10-CM

## 2018-07-09 LAB — HEPATITIS B SURFACE ANTIGEN: Hepatitis B Surface Ag: NEGATIVE

## 2018-07-09 NOTE — Progress Notes (Signed)
Patient notified of results. Expressed understanding. Due to work schedule he would prefer to receive the vaccine at his appt for his CPE on 08/18/18

## 2018-07-10 LAB — HIV ANTIBODY (ROUTINE TESTING W REFLEX): HIV Screen 4th Generation wRfx: NONREACTIVE

## 2018-07-10 LAB — HEPATITIS C VRS RNA DETECT BY PCR-QUAL: HCV RNA NAA Qualitative: NEGATIVE

## 2018-07-10 LAB — HEPATITIS B SURFACE ANTIBODY, QUANTITATIVE: Hepatitis B Surf Ab Quant: <3.1 m[IU]/mL — ABNORMAL LOW (ref >9.9)

## 2018-07-10 NOTE — Progress Notes (Signed)
Patient notified of results. Expressed understanding.

## 2018-08-18 ENCOUNTER — Ambulatory Visit: Payer: Medicaid Other | Admitting: Family Medicine

## 2018-08-25 ENCOUNTER — Ambulatory Visit: Payer: Medicaid Other | Admitting: Family Medicine

## 2018-09-02 ENCOUNTER — Ambulatory Visit (INDEPENDENT_AMBULATORY_CARE_PROVIDER_SITE_OTHER): Payer: Self-pay | Admitting: Family Medicine

## 2018-09-02 ENCOUNTER — Encounter: Payer: Self-pay | Admitting: Family Medicine

## 2018-09-02 VITALS — BP 117/71 | HR 80 | Resp 17 | Ht 70.0 in | Wt 159.4 lb

## 2018-09-02 DIAGNOSIS — Z125 Encounter for screening for malignant neoplasm of prostate: Secondary | ICD-10-CM

## 2018-09-02 DIAGNOSIS — Z0000 Encounter for general adult medical examination without abnormal findings: Secondary | ICD-10-CM

## 2018-09-02 DIAGNOSIS — Z23 Encounter for immunization: Secondary | ICD-10-CM

## 2018-09-02 DIAGNOSIS — R9431 Abnormal electrocardiogram [ECG] [EKG]: Secondary | ICD-10-CM

## 2018-09-02 DIAGNOSIS — Z13220 Encounter for screening for lipoid disorders: Secondary | ICD-10-CM

## 2018-09-02 DIAGNOSIS — F17200 Nicotine dependence, unspecified, uncomplicated: Secondary | ICD-10-CM

## 2018-09-02 DIAGNOSIS — E875 Hyperkalemia: Secondary | ICD-10-CM

## 2018-09-02 DIAGNOSIS — Z1389 Encounter for screening for other disorder: Secondary | ICD-10-CM

## 2018-09-02 DIAGNOSIS — Z0001 Encounter for general adult medical examination with abnormal findings: Secondary | ICD-10-CM

## 2018-09-02 DIAGNOSIS — F17210 Nicotine dependence, cigarettes, uncomplicated: Secondary | ICD-10-CM

## 2018-09-02 DIAGNOSIS — R7303 Prediabetes: Secondary | ICD-10-CM

## 2018-09-02 LAB — POCT URINALYSIS DIP (CLINITEK)
BILIRUBIN UA: NEGATIVE
Glucose, UA: NEGATIVE mg/dL
Ketones, POC UA: NEGATIVE mg/dL
Leukocytes, UA: NEGATIVE
NITRITE UA: NEGATIVE
POC PROTEIN,UA: NEGATIVE
Spec Grav, UA: 1.025 (ref 1.010–1.025)
Urobilinogen, UA: 0.2 E.U./dL
pH, UA: 5.5 (ref 5.0–8.0)
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
## 2018-09-02 NOTE — Patient Instructions (Addendum)

## 2018-09-03 LAB — CBC WITH DIFFERENTIAL/PLATELET
Basophils Absolute: 0.1 10*3/uL (ref 0.0–0.2)
Basos: 1 %
EOS (ABSOLUTE): 0.1 10*3/uL (ref 0.0–0.4)
EOS: 3 %
HEMATOCRIT: 45.6 % (ref 37.5–51.0)
Hemoglobin: 15.5 g/dL (ref 13.0–17.7)
Immature Grans (Abs): 0 10*3/uL (ref 0.0–0.1)
Immature Granulocytes: 0 %
Lymphocytes Absolute: 1.4 10*3/uL (ref 0.7–3.1)
Lymphs: 36 %
MCH: 30.6 pg (ref 26.6–33.0)
MCHC: 34 g/dL (ref 31.5–35.7)
MCV: 90 fL (ref 79–97)
MONOCYTES: 12 %
Monocytes Absolute: 0.5 10*3/uL (ref 0.1–0.9)
Neutrophils Absolute: 1.9 10*3/uL (ref 1.4–7.0)
Neutrophils: 48 %
Platelets: 269 10*3/uL (ref 150–450)
RBC: 5.07 x10E6/uL (ref 4.14–5.80)
RDW: 13.7 % (ref 12.3–15.4)
WBC: 4 10*3/uL (ref 3.4–10.8)

## 2018-09-03 LAB — COMPREHENSIVE METABOLIC PANEL
ALT: 10 IU/L (ref 0–44)
AST: 16 IU/L (ref 0–40)
Albumin/Globulin Ratio: 1.6 (ref 1.2–2.2)
Albumin: 4.3 g/dL (ref 3.6–4.8)
Alkaline Phosphatase: 75 IU/L (ref 39–117)
BUN / CREAT RATIO: 9 — AB (ref 10–24)
BUN: 9 mg/dL (ref 8–27)
Bilirubin Total: 0.6 mg/dL (ref 0.0–1.2)
CO2: 23 mmol/L (ref 20–29)
Calcium: 9.7 mg/dL (ref 8.6–10.2)
Chloride: 101 mmol/L (ref 96–106)
Creatinine, Ser: 1.01 mg/dL (ref 0.76–1.27)
GFR calc Af Amer: 91 mL/min/{1.73_m2} (ref >59)
GFR calc non Af Amer: 79 mL/min/{1.73_m2} (ref >59)
GLOBULIN, TOTAL: 2.7 g/dL (ref 1.5–4.5)
Glucose: 96 mg/dL (ref 65–99)
POTASSIUM: 5.3 mmol/L — AB (ref 3.5–5.2)
SODIUM: 144 mmol/L (ref 134–144)
Total Protein: 7 g/dL (ref 6.0–8.5)

## 2018-09-03 LAB — LIPID PANEL
CHOLESTEROL TOTAL: 171 mg/dL (ref 100–199)
Chol/HDL Ratio: 3.5 ratio (ref 0.0–5.0)
HDL: 49 mg/dL (ref >39)
LDL Calculated: 107 mg/dL — ABNORMAL HIGH (ref 0–99)
Triglycerides: 76 mg/dL (ref 0–149)
VLDL Cholesterol Cal: 15 mg/dL (ref 5–40)

## 2018-09-03 LAB — HEMOGLOBIN A1C
Est. average glucose Bld gHb Est-mCnc: 123 mg/dL
Hgb A1c MFr Bld: 5.9 % — ABNORMAL HIGH (ref 4.8–5.6)

## 2018-09-03 LAB — THYROID PANEL WITH TSH
Free Thyroxine Index: 2.3 (ref 1.2–4.9)
T3 Uptake Ratio: 30 % (ref 24–39)
T4, Total: 7.8 ug/dL (ref 4.5–12.0)
TSH: 0.505 u[IU]/mL (ref 0.450–4.500)

## 2018-09-03 LAB — PSA: Prostate Specific Ag, Serum: 0.9 ng/mL (ref 0.0–4.0)

## 2018-09-04 ENCOUNTER — Telehealth: Payer: Self-pay | Admitting: Family Medicine

## 2018-09-04 ENCOUNTER — Encounter: Payer: Self-pay | Admitting: Family Medicine

## 2018-09-04 NOTE — Telephone Encounter (Signed)
Contact patient to schedule an appointment for a repeat potassium level lab visit.  See lab note that was sent to patient via my chart below:  All labs were within expected range with the exception of your potassium is slightly elevated. I recommend decreasing intake foods over the next 7 days which are high in potassium such as bananas, apples, and green leafy vegetables. I would like for you to return to the office in 1 week and have your potassium level rechecked. Please call the office to schedule a lab appointment, [phone]. Secondly your hemoglobin A1c has increased however is still within range of prediabetes. Encourage increase intake of vegetables along with developing routine physical activity such as walking at least 3 to 5 days/week. Smoking cessation is also recommended.

## 2018-09-04 NOTE — Progress Notes (Signed)
All labs were within expected range with the exception of your potassium is slightly elevated.  I recommend decreasing intake foods over the next 7 days which are high in potassium such as bananas, apples, and green leafy vegetables.  I would like for you to return to the office in 1 week and have your potassium level rechecked.  Please call the office to schedule a lab appointment, [phone]. Secondly your hemoglobin A1c has increased however is still within range of prediabetes.  Encourage increase intake of vegetables along with developing routine physical activity such as walking at least 3 to 5 days/week.  Smoking cessation is also recommended.

## 2018-09-11 ENCOUNTER — Other Ambulatory Visit: Payer: Medicaid Other

## 2018-09-18 ENCOUNTER — Other Ambulatory Visit: Payer: Medicaid Other

## 2019-06-21 ENCOUNTER — Encounter: Payer: Self-pay | Admitting: Family Medicine

## 2019-06-30 ENCOUNTER — Ambulatory Visit: Payer: Medicaid Other

## 2019-07-08 ENCOUNTER — Ambulatory Visit: Payer: Self-pay | Attending: Family Medicine

## 2019-07-08 ENCOUNTER — Other Ambulatory Visit: Payer: Self-pay

## 2019-07-22 ENCOUNTER — Ambulatory Visit: Payer: Self-pay | Attending: Family Medicine

## 2019-07-22 ENCOUNTER — Other Ambulatory Visit: Payer: Self-pay

## 2019-07-22 ENCOUNTER — Telehealth: Payer: Self-pay | Admitting: Family Medicine

## 2019-07-22 NOTE — Telephone Encounter (Signed)
Patient called to know if his paperwork was received states he sent it over by fax . Please follow up.

## 2019-07-22 NOTE — Telephone Encounter (Signed)
I called back the Pt, LVM inform him that I got the [facility] letter

## 2019-07-28 ENCOUNTER — Telehealth: Payer: Self-pay | Admitting: Family Medicine

## 2019-07-28 NOTE — Telephone Encounter (Signed)
I called the PT and LVM, informed her that we need the last 3 month of the bank statement that show on the 2019 taxes , for her need the [facility] and her husband the [employer], wee need t for the month of July, August, September to compleet the application

## 2019-08-05 ENCOUNTER — Telehealth: Payer: Self-pay | Admitting: Family Medicine

## 2019-08-05 NOTE — Telephone Encounter (Signed)
Pt was sent a letter from financial dept. Inform them, that the application they submitted was incomplete, since they were missing some documentation at the time of the appointment, Pt need to reschedule and resubmit all new papers and application for CAFA and [place], P.S. old documents has been sent back by mail to the Pt and Pt. need to make a new appt. 

## 2019-08-11 ENCOUNTER — Ambulatory Visit: Payer: Medicaid Other

## 2019-08-11 ENCOUNTER — Encounter: Payer: Self-pay | Admitting: Family Medicine

## 2019-08-11 ENCOUNTER — Encounter: Payer: Medicaid Other | Admitting: Nurse Practitioner

## 2019-08-11 NOTE — Progress Notes (Signed)
Virtual Visit via Telephone Note Due to national recommendations of social distancing due to [place] 19, telehealth visit is felt to be most appropriate for this patient at this time.  I discussed the limitations, risks, security and privacy concerns of performing an evaluation and management service by telephone and the availability of in person appointments. I also discussed with the patient that there may be a patient responsible charge related to this service. The patient expressed understanding and agreed to proceed.    I connected with Erik Ward on 08/11/19  at  10:50 AM EST  EDT by telephone and verified that I am speaking with the correct person using two identifiers.   Consent I discussed the limitations, risks, security and privacy concerns of performing an evaluation and management service by telephone and the availability of in person appointments. I also discussed with the patient that there may be a patient responsible charge related to this service. The patient expressed understanding and agreed to proceed.   Location of Patient: Private Residence   Location of Provider: [place] and [place] participating in Telemedicine visit: Geryl Rankins FNP-BC [place]    History of Present Illness: Telemedicine visit for: Re establish Care   I attempted to call patient 3 times. Left voice mail. It is not 30 minutes after his appointment time and he has not called back.     Past Medical History:  Diagnosis Date  . Subarachnoid hemorrhage following injury, with loss of consciousness ([place]) 09/18/2012   syncope and collapse (09/18/2012)  . Syncope and collapse 09/18/2012   w/loss of consciousness (09/18/2012)    Past Surgical History:  Procedure Laterality Date  . PERCUTANEOUS PINNING PHALANX FRACTURE OF HAND  1980's   "right" (09/18/2012)    No family history on file.  Social History   Socioeconomic History  .  Marital status: Married    Spouse name: Not on file  . Number of children: Not on file  . Years of education: Not on file  . Highest education level: Not on file  Occupational History  . Not on file  Social Needs  . Financial resource strain: Not on file  . Food insecurity    Worry: Not on file    Inability: Not on file  . Transportation needs    Medical: Not on file    Non-medical: Not on file  Tobacco Use  . Smoking status: Current Every Day Smoker    Packs/day: 0.50    Years: 40.00    Pack years: 20.00    Types: Cigarettes  . Smokeless tobacco: Never Used  . Tobacco comment: 09/18/2012 offered smoking cessation materials; pt declines  Substance and Sexual Activity  . Alcohol use: No  . Drug use: No  . Sexual activity: Not Currently  Lifestyle  . Physical activity    Days per week: Not on file    Minutes per session: Not on file  . Stress: Not on file  Relationships  . Social Herbalist on phone: Not on file    Gets together: Not on file    Attends religious service: Not on file    Active member of club or organization: Not on file    Attends meetings of clubs or organizations: Not on file    Relationship status: Not on file  Other Topics Concern  . Not on file  Social History Narrative  . Not on file  Observations/Objective: Awake, alert and oriented x 3   ROS  Assessment and Plan: There are no diagnoses linked to this encounter.   Follow Up Instructions No follow-ups on file.     I discussed the assessment and treatment plan with the patient. The patient was provided an opportunity to ask questions and all were answered. The patient agreed with the plan and demonstrated an understanding of the instructions.   The patient was advised to call back or seek an in-person evaluation if the symptoms worsen or if the condition fails to improve as anticipated.  I provided 0 minutes of non-face-to-face time during this encounter including median  intraservice time, reviewing previous notes, labs, imaging, medications and explaining diagnosis and management.  Claiborne Rigg, FNP-BC

## 2019-08-11 NOTE — Progress Notes (Signed)
Patient is in process of applying for CAFA. Has appointment next week but since he hadn't been seen in almost a year he needed a provider visit.

## 2019-08-16 ENCOUNTER — Ambulatory Visit: Payer: Self-pay | Attending: Family Medicine

## 2019-08-16 ENCOUNTER — Other Ambulatory Visit: Payer: Self-pay

## 2019-11-05 ENCOUNTER — Ambulatory Visit: Payer: Medicare Other | Admitting: Family Medicine

## 2019-12-01 ENCOUNTER — Other Ambulatory Visit: Payer: Self-pay

## 2019-12-01 ENCOUNTER — Encounter: Payer: Self-pay | Admitting: Family Medicine

## 2019-12-01 ENCOUNTER — Ambulatory Visit: Payer: Medicare Other | Attending: Family Medicine | Admitting: Family Medicine

## 2019-12-01 VITALS — BP 125/80 | HR 88 | Temp 97.5°F | Resp 16 | Ht 66.0 in | Wt 149.8 lb

## 2019-12-01 DIAGNOSIS — E875 Hyperkalemia: Secondary | ICD-10-CM

## 2019-12-01 DIAGNOSIS — J432 Centrilobular emphysema: Secondary | ICD-10-CM | POA: Diagnosis not present

## 2019-12-01 DIAGNOSIS — F17210 Nicotine dependence, cigarettes, uncomplicated: Secondary | ICD-10-CM

## 2019-12-01 DIAGNOSIS — R39198 Other difficulties with micturition: Secondary | ICD-10-CM

## 2019-12-01 DIAGNOSIS — N5089 Other specified disorders of the male genital organs: Secondary | ICD-10-CM

## 2019-12-01 DIAGNOSIS — Z8249 Family history of ischemic heart disease and other diseases of the circulatory system: Secondary | ICD-10-CM | POA: Insufficient documentation

## 2019-12-01 DIAGNOSIS — Z87448 Personal history of other diseases of urinary system: Secondary | ICD-10-CM

## 2019-12-01 DIAGNOSIS — Z800 Family history of malignant neoplasm of digestive organs: Secondary | ICD-10-CM | POA: Diagnosis not present

## 2019-12-01 DIAGNOSIS — R7303 Prediabetes: Secondary | ICD-10-CM | POA: Diagnosis present

## 2019-12-01 DIAGNOSIS — N509 Disorder of male genital organs, unspecified: Secondary | ICD-10-CM | POA: Diagnosis not present

## 2019-12-01 NOTE — Patient Instructions (Signed)
Preventing Type 2 Diabetes Mellitus Type 2 diabetes (type 2 diabetes mellitus) is a long-term (chronic) disease that affects blood sugar (glucose) levels. Normally, a hormone called insulin allows glucose to enter cells in the body. The cells use glucose for energy. In type 2 diabetes, one or both of these problems may be present:  The body does not make enough insulin.  The body does not respond properly to insulin that it makes (insulin resistance). Insulin resistance or lack of insulin causes excess glucose to build up in the blood instead of going into cells. As a result, high blood glucose (hyperglycemia) develops, which can cause many complications. Being overweight or obese and having an inactive (sedentary) lifestyle can increase your risk for diabetes. Type 2 diabetes can be delayed or prevented by making certain nutrition and lifestyle changes. What nutrition changes can be made?   Eat healthy meals and snacks regularly. Keep a healthy snack with you for when you get hungry between meals, such as fruit or a handful of nuts.  Eat lean meats and proteins that are low in saturated fats, such as chicken, fish, egg whites, and beans. Avoid processed meats.  Eat plenty of fruits and vegetables and plenty of grains that have not been processed (whole grains). It is recommended that you eat: ? 1?2 cups of fruit every day. ? 2?3 cups of vegetables every day. ? 6?8 oz of whole grains every day, such as oats, whole wheat, bulgur, brown rice, quinoa, and millet.  Eat low-fat dairy products, such as milk, yogurt, and cheese.  Eat foods that contain healthy fats, such as nuts, avocado, olive oil, and canola oil.  Drink water throughout the day. Avoid drinks that contain added sugar, such as soda or sweet tea.  Follow instructions from your health care provider about specific eating or drinking restrictions.  Control how much food you eat at a time (portion size). ? Check food labels to find  out the serving sizes of foods. ? Use a kitchen scale to weigh amounts of foods.  Saute or steam food instead of frying it. Cook with water or broth instead of oils or butter.  Limit your intake of: ? Salt (sodium). Have no more than 1 tsp (2,400 mg) of sodium a day. If you have heart disease or high blood pressure, have less than ? tsp (1,500 mg) of sodium a day. ? Saturated fat. This is fat that is solid at room temperature, such as butter or fat on meat. What lifestyle changes can be made? Activity   Do moderate-intensity physical activity for at least 30 minutes on at least 5 days of the week, or as much as told by your health care provider.  Ask your health care provider what activities are safe for you. A mix of physical activities may be best, such as walking, swimming, cycling, and strength training.  Try to add physical activity into your day. For example: ? Park in spots that are farther away than usual, so that you walk more. For example, park in a far corner of the parking lot when you go to the office or the grocery store. ? Take a walk during your lunch break. ? Use stairs instead of elevators or escalators. Weight Loss  Lose weight as directed. Your health care provider can determine how much weight loss is best for you and can help you lose weight safely.  If you are overweight or obese, you may be instructed to lose at least 5?7 %   of your body weight. Alcohol and Tobacco   Limit alcohol intake to no more than 1 drink a day for nonpregnant women and 2 drinks a day for men. One drink equals 12 oz of beer, 5 oz of wine, or 1 oz of hard liquor.  Do not use any tobacco products, such as cigarettes, chewing tobacco, and e-cigarettes. If you need help quitting, ask your health care provider. Work With [place] Provider  Have your blood glucose tested regularly, as told by your health care provider.  Discuss your risk factors and how you can reduce your risk for  diabetes.  Get screening tests as told by your health care provider. You may have screening tests regularly, especially if you have certain risk factors for type 2 diabetes.  Make an appointment with a diet and nutrition specialist (registered dietitian). A registered dietitian can help you make a healthy eating plan and can help you understand portion sizes and food labels. Why are these changes important?  It is possible to prevent or delay type 2 diabetes and related health problems by making lifestyle and nutrition changes.  It can be difficult to recognize signs of type 2 diabetes. The best way to avoid possible damage to your body is to take actions to prevent the disease before you develop symptoms. What can happen if changes are not made?  Your blood glucose levels may keep increasing. Having high blood glucose for a long time is dangerous. Too much glucose in your blood can damage your blood vessels, heart, kidneys, nerves, and eyes.  You may develop prediabetes or type 2 diabetes. Type 2 diabetes can lead to many chronic health problems and complications, such as: ? Heart disease. ? Stroke. ? Blindness. ? Kidney disease. ? Depression. ? Poor circulation in the feet and legs, which could lead to surgical removal (amputation) in severe cases. Where to find support  Ask your health care provider to recommend a registered dietitian, diabetes educator, or weight loss program.  Look for local or online weight loss groups.  Join a gym, fitness club, or outdoor activity group, such as a walking club. Where to find more information To learn more about diabetes and diabetes prevention, visit:  American Diabetes Association (ADA): www.diabetes.[employer] of Diabetes and Digestive and Kidney Diseases: FindSpin.nl To learn more about healthy eating, visit:  The U.S. Department of Agriculture Scientist, research (physical sciences)), Choose My Plate:  http://wiley-williams.com/  Office of Disease Prevention and Health Promotion (ODPHP), Dietary Guidelines: SurferLive.at Summary  You can reduce your risk for type 2 diabetes by increasing your physical activity, eating healthy foods, and losing weight as directed.  Talk with your health care provider about your risk for type 2 diabetes. Ask about any blood tests or screening tests that you need to have. This information is not intended to replace advice given to you by your health care provider. Make sure you discuss any questions you have with your health care provider. Document Revised: 01/08/2019 Document Reviewed: 11/07/2015 Elsevier Patient Education  [place].  Chronic Obstructive Pulmonary Disease Chronic obstructive pulmonary disease (COPD) is a long-term (chronic) lung problem. When you have COPD, it is hard for air to get in and out of your lungs. Usually the condition gets worse over time, and your lungs will never return to normal. There are things you can do to keep yourself as healthy as possible.  Your doctor may treat your condition with: ? Medicines. ? Oxygen. ? Lung surgery.  Your  doctor may also recommend: ? Rehabilitation. This includes steps to make your body work better. It may involve a team of specialists. ? Quitting smoking, if you smoke. ? Exercise and changes to your diet. ? Comfort measures (palliative care). Follow these instructions at home: Medicines  Take over-the-counter and prescription medicines only as told by your doctor.  Talk to your doctor before taking any cough or allergy medicines. You may need to avoid medicines that cause your lungs to be dry. Lifestyle  If you smoke, stop. Smoking makes the problem worse. If you need help quitting, ask your doctor.  Avoid being around things that make your breathing worse. This may include smoke, chemicals, and fumes.  Stay active, but remember to rest as  well.  Learn and use tips on how to relax.  Make sure you get enough sleep. Most adults need at least 7 hours of sleep every night.  Eat healthy foods. Eat smaller meals more often. Rest before meals. Controlled breathing Learn and use tips on how to control your breathing as told by your doctor. Try:  Breathing in (inhaling) through your nose for 1 second. Then, pucker your lips and breath out (exhale) through your lips for 2 seconds.  Putting one hand on your belly (abdomen). Breathe in slowly through your nose for 1 second. Your hand on your belly should move out. Pucker your lips and breathe out slowly through your lips. Your hand on your belly should move in as you breathe out.  Controlled coughing Learn and use controlled coughing to clear mucus from your lungs. Follow these steps: 1. Lean your head a little forward. 2. Breathe in deeply. 3. Try to hold your breath for 3 seconds. 4. Keep your mouth slightly open while coughing 2 times. 5. Spit any mucus out into a tissue. 6. Rest and do the steps again 1 or 2 times as needed. General instructions  Make sure you get all the shots (vaccines) that your doctor recommends. Ask your doctor about a flu shot and a pneumonia shot.  Use oxygen therapy and pulmonary rehabilitation if told by your doctor. If you need home oxygen therapy, ask your doctor if you should buy a tool to measure your oxygen level (oximeter).  Make a COPD action plan with your doctor. This helps you to know what to do if you feel worse than usual.  Manage any other conditions you have as told by your doctor.  Avoid going outside when it is very hot, cold, or humid.  Avoid people who have a sickness you can catch (contagious).  Keep all follow-up visits as told by your doctor. This is important. Contact a doctor if:  You cough up more mucus than usual.  There is a change in the color or thickness of the mucus.  It is harder to breathe than usual.  Your  breathing is faster than usual.  You have trouble sleeping.  You need to use your medicines more often than usual.  You have trouble doing your normal activities such as getting dressed or walking around the house. Get help right away if:  You have shortness of breath while resting.  You have shortness of breath that stops you from: ? Being able to talk. ? Doing normal activities.  Your chest hurts for longer than 5 minutes.  Your skin color is more blue than usual.  Your pulse oximeter shows that you have low oxygen for longer than 5 minutes.  You have a fever.  You feel too tired to breathe normally. Summary  Chronic obstructive pulmonary disease (COPD) is a long-term lung problem.  The way your lungs work will never return to normal. Usually the condition gets worse over time. There are things you can do to keep yourself as healthy as possible.  Take over-the-counter and prescription medicines only as told by your doctor.  If you smoke, stop. Smoking makes the problem worse. This information is not intended to replace advice given to you by your health care provider. Make sure you discuss any questions you have with your health care provider. Document Revised: 08/29/2017 Document Reviewed: 10/21/2016 Elsevier Patient Education  2020 [employer].

## 2019-12-01 NOTE — Progress Notes (Signed)
Pt states his left pinky finger is still numb

## 2019-12-01 NOTE — Progress Notes (Signed)
Subjective:  Patient ID: Erik Ward, male    DOB: 12-03-1955  Age: 64 y.o. MRN: [id number]  CC: re-establish and Referral   HPI Erik Ward, 64 year old male who was previously established with Erik Courts, FNP for care who now wishes to establish at this office.  He was last seen by his prior PCP on 09/02/2018 for his annual physical examination.  His past medical history is significant for subarachnoid hemorrhage with loss of consciousness in December 2013.  Patient also smokes on a daily basis.  On review of chart, he has a history of prediabetes with hemoglobin A1c of 5.9 on 09/02/2018.  He also had trace blood on urinalysis and hyperkalemia with potassium of 5.3 on blood work done 09/02/2018.         At today's visit, he reports no increased thirst or urinary frequency related to his diagnosis of prediabetes.  He does continue to smoke on a daily basis and would like to quit.  He believes he smoked at least 1 pack/day of cigarettes for the past 50 years.  He does have some shortness of breath with exertion.  He denies any recurrent cough.  He does feel as if he has decreased ability to breathe at night when lying down which he attributes to decreased ability to breathe through his nose due to congestion.  He has seen no blood in the urine.  He does have complaint however of noticing a curvature in his penis and he looked this up online and believes that he may have Peyronie's disease.  He additionally reports that he has noticed weakening of his urinary stream over time.  He also reports that he has palpated masses in his testicles for more than a year and these areas appear to be getting larger.  He denies any night sweats and no loss of appetite but was surprised at his weight today as he believes that his last weight was 159 as compared to 149 at today's visit.          On review of family history, he believes that his father had colon cancer.  Patient does not wish to be scheduled for  colonoscopy and was also offered Cologuard or fecal occult blood testing of stool which patient also declined.  He reports that the main reason he came to today's visit was to get a referral to a specialist regarding his urinary tract issues.  Past Medical History:  Diagnosis Date  . Subarachnoid hemorrhage following injury, with loss of consciousness (HCC) 09/18/2012   syncope and collapse (09/18/2012)  . Syncope and collapse 09/18/2012   w/loss of consciousness (09/18/2012)    Past Surgical History:  Procedure Laterality Date  . PERCUTANEOUS PINNING PHALANX FRACTURE OF HAND  1980's   "right" (09/18/2012)    Family History  Problem Relation Age of Onset  . Hypertension Mother   . Colon cancer Father     Social History   Tobacco Use  . Smoking status: Current Every Day Smoker    Packs/day: 0.50    Years: 40.00    Pack years: 20.00    Types: Cigarettes  . Smokeless tobacco: Never Used  . Tobacco comment: 09/18/2012 offered smoking cessation materials; pt declines  Substance Use Topics  . Alcohol use: No   He reports that he is not technically retired but currently not working.  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  ROS Review of Systems  Constitutional: Positive for fatigue (  Mild). Negative for chills and fever.  HENT: Positive for congestion. Negative for sore throat and trouble swallowing.   Eyes: Negative for photophobia and visual disturbance.  Respiratory: Positive for shortness of breath. Negative for cough.   Cardiovascular: Negative for chest pain, palpitations and leg swelling.  Gastrointestinal: Negative for abdominal pain, blood in stool, constipation, diarrhea and nausea.  Endocrine: Negative for polydipsia, polyphagia and polyuria.  Genitourinary: Positive for difficulty urinating. Negative for dysuria and frequency.       He reports testicular masses, no testicular pain  Musculoskeletal: Negative for  arthralgias, back pain and gait problem.  Neurological: Negative for dizziness and headaches.  Hematological: Negative for adenopathy. Does not bruise/bleed easily.  Psychiatric/Behavioral: Positive for sleep disturbance (Feels as if he cannot sleep due to nasal congestion/sensation of being unable to breathe through his nose). Negative for self-injury and suicidal ideas.    Objective:   Today's Vitals: BP 125/80   Pulse 88   Temp (!) 97.5 F (36.4 C)   Resp 16   Ht 5\' 6"  (1.676 m)   Wt 149 lb 12.8 oz (67.9 kg)   SpO2 95%   BMI 24.18 kg/m   Physical Exam Vitals and nursing note reviewed.  Constitutional:      General: He is not in acute distress.    Appearance: Normal appearance.  Neck:     Vascular: No carotid bruit.  Cardiovascular:     Rate and Rhythm: Normal rate and regular rhythm.  Pulmonary:     Effort: Pulmonary effort is normal.     Breath sounds: Normal breath sounds.  Abdominal:     Palpations: Abdomen is soft.     Tenderness: There is no abdominal tenderness. There is no right CVA tenderness, left CVA tenderness, guarding or rebound.  Genitourinary:    Comments: Not examined as patient mentioned these issues at the very end of his visit and requested specialty follow-up/declined exam Musculoskeletal:        General: No tenderness.     Cervical back: Neck supple. No rigidity or tenderness.     Right lower leg: No edema.     Left lower leg: No edema.  Lymphadenopathy:     Cervical: No cervical adenopathy.  Skin:    General: Skin is warm and dry.  Neurological:     General: No focal deficit present.     Mental Status: He is alert and oriented to person, place, and time.  Psychiatric:        Mood and Affect: Mood normal.        Behavior: Behavior normal.     Assessment & Plan:  1. Prediabetes On review of chart, patient with hemoglobin A1c of 5.9 on 09/02/2018 and this will be repeated at today's visit along with a BMP.  Information on preventing type 2  diabetes was given to the patient as part of his AVS-after visit summary.  We will discuss the need for further interventions/medications based on blood work that will be done at today's visit. - Basic Metabolic Panel - Hemoglobin A1c  2. Hyperkalemia Patient has had past blood work with potassium level of 5.3 on blood work done 09/02/2018 and this will be repeated at today's visit as part of basic metabolic panel. - Basic Metabolic Panel  3. History of hematuria He has had prior trace RBCs on urinalysis done in December 2019 and he will have repeat urinalysis at today's visit and follow-up. - Urinalysis, Complete  4. Slowing of urinary stream 5.  Testicular mass He reports that the actual reason for today's visit is that he has noticed slowing of his urinary stream as well as testicular masses which have increased in size over the past year.  Urgent referral is being placed to urology for further evaluation and treatment.  6. Centrilobular emphysema; 7.Tobacco dependence Discussed with patient that on review of his past imaging, he had a chest CT done on 01/25/2002 showing extensive centrilobular emphysema.  He was offered pulmonology referral for further evaluation and treatment of emphysema/COPD.  Educational material on COPD given as part of his after visit summary.  Patient declines pulmonology referral.  He is interested in smoking cessation and agrees to meet with the clinical pharmacist for help with smoking cessation.   Outpatient Encounter Medications as of 12/01/2019  Medication Sig  . Multiple Vitamin (MULTIVITAMIN) tablet Take 1 tablet by mouth daily.   No facility-administered encounter medications on file as of 12/01/2019.    An After Visit Summary was printed and given to the patient.   Follow-up: Return in about 5 weeks (around 01/05/2020) for current issues/labs; 2-3 weeks with Luke/CPP to help with smoking cessation.  30 or more minutes was spent with the patient in  face-to-face consultation obtaining history of present illness, review of systems, family, social and past medical history as well as performing examination, reviewing assessment and treatment plan as patient is new to me as a provider.  An additional 12 minutes was spent on completion of today's encounter note including chart review and placement of labs/orders/referrals.   Cain Saupe MD

## 2019-12-02 LAB — URINALYSIS, COMPLETE
Bilirubin, UA: NEGATIVE
Glucose, UA: NEGATIVE
Ketones, UA: NEGATIVE
Leukocytes,UA: NEGATIVE
Nitrite, UA: NEGATIVE
Protein,UA: NEGATIVE
RBC, UA: NEGATIVE
Specific Gravity, UA: 1.027 (ref 1.005–1.030)
Urobilinogen, Ur: 1 mg/dL (ref 0.2–1.0)
pH, UA: 6 (ref 5.0–7.5)

## 2019-12-02 LAB — MICROSCOPIC EXAMINATION
Casts: NONE SEEN /LPF
Epithelial Cells (non renal): NONE SEEN /HPF (ref 0–10)

## 2019-12-02 LAB — HEMOGLOBIN A1C
Est. average glucose Bld gHb Est-mCnc: 131 mg/dL
Hgb A1c MFr Bld: 6.2 % — ABNORMAL HIGH (ref 4.8–5.6)

## 2019-12-02 LAB — BASIC METABOLIC PANEL WITH GFR
BUN/Creatinine Ratio: 10 (ref 10–24)
BUN: 12 mg/dL (ref 8–27)
CO2: 24 mmol/L (ref 20–29)
Calcium: 9.4 mg/dL (ref 8.6–10.2)
Chloride: 98 mmol/L (ref 96–106)
Creatinine, Ser: 1.22 mg/dL (ref 0.76–1.27)
GFR calc Af Amer: 71 mL/min/1.73
GFR calc non Af Amer: 62 mL/min/1.73
Glucose: 85 mg/dL (ref 65–99)
Potassium: 4.5 mmol/L (ref 3.5–5.2)
Sodium: 139 mmol/L (ref 134–144)

## 2019-12-16 ENCOUNTER — Ambulatory Visit: Payer: Medicare Other | Attending: Family Medicine | Admitting: Pharmacist

## 2019-12-16 ENCOUNTER — Other Ambulatory Visit: Payer: Self-pay

## 2019-12-16 DIAGNOSIS — F17210 Nicotine dependence, cigarettes, uncomplicated: Secondary | ICD-10-CM | POA: Diagnosis present

## 2019-12-16 DIAGNOSIS — Z716 Tobacco abuse counseling: Secondary | ICD-10-CM

## 2019-12-16 NOTE — Patient Instructions (Signed)

## 2019-12-16 NOTE — Progress Notes (Signed)
S:  Patient was reviewed by clinical pharmacist for assistance with tobacco cessation. Patient identity was verified using date of birth and address.  Tobacco Use History  Age when started using tobacco on a daily basis 64 YO.  Type: cigarettes.  Number of cigarettes per day 15.  Estimated nicotine content: 10.5 - 15 mg per day.   Smokes first cigarette ~30 minutes after waking.  Does wake at night to smoke  Triggers include physical:  Quit Attempt History   Most recent quit attempt >10 years ago.  Methods tried in the past include: None (cold [place])  Rates IMPORTANCE of quitting tobacco on 1-10 scale of 5.  Rates READINESS of quitting tobacco on 1-10 scale of 5.  Rates CONFIDENCE of quitting tobacco on 1-10 scale of 5.  Motivators to quitting include health; barriers include habbit   A/P: Nicotine dependence: severe , >30 years duration in a patient who is poor candidate for success at this time. He does not wish to start treatment today and has not set a quit date. He is interested in information concerning methods used for smoking cessation.      Reviewed options.Treatment was reviewed with the patient, including name, instructions, goals of therapy, potential side effects, importance of adherence, and safe use.   Reviewed potential challenges and coping skills/strategies with patient. Provided information on 1 800-QUIT NOW support program and advised patient to contact me if questions/concerns arise. Patient verbalized understanding of information by repeating back.  Follow up when interested in starting therapy.   [name] 1:36 PM 12/16/2019

## 2020-01-06 ENCOUNTER — Ambulatory Visit: Payer: Medicare Other | Admitting: Family Medicine

## 2020-02-10 ENCOUNTER — Other Ambulatory Visit: Payer: Self-pay

## 2020-02-10 ENCOUNTER — Ambulatory Visit: Payer: Medicaid Other | Attending: Family Medicine

## 2020-05-28 ENCOUNTER — Other Ambulatory Visit: Payer: Self-pay

## 2020-05-28 ENCOUNTER — Ambulatory Visit
Admission: EM | Admit: 2020-05-28 | Discharge: 2020-05-28 | Discharge status: Against Medical Advice | Payer: Medicaid Other

## 2020-05-31 ENCOUNTER — Encounter (HOSPITAL_COMMUNITY): Payer: Self-pay | Admitting: Emergency Medicine

## 2020-05-31 ENCOUNTER — Emergency Department (HOSPITAL_COMMUNITY)
Admission: EM | Admit: 2020-05-31 | Discharge: 2020-05-31 | Disposition: A | Discharge status: Home / Self Care | Payer: Medicaid Other | Attending: Emergency Medicine | Admitting: Emergency Medicine

## 2020-05-31 ENCOUNTER — Other Ambulatory Visit: Payer: Self-pay

## 2020-05-31 DIAGNOSIS — F17210 Nicotine dependence, cigarettes, uncomplicated: Secondary | ICD-10-CM | POA: Insufficient documentation

## 2020-05-31 DIAGNOSIS — B0230 Zoster ocular disease, unspecified: Secondary | ICD-10-CM

## 2020-05-31 DIAGNOSIS — R21 Rash and other nonspecific skin eruption: Secondary | ICD-10-CM | POA: Insufficient documentation

## 2020-05-31 DIAGNOSIS — B028 Zoster with other complications: Secondary | ICD-10-CM | POA: Insufficient documentation

## 2020-05-31 MED ORDER — TETRACAINE HCL 0.5 % OP SOLN
2.0000 [drp] | Freq: Once | OPHTHALMIC | Status: AC
  Administered 2020-05-31: 2 [drp] via OPHTHALMIC
  Filled 2020-05-31: qty 4

## 2020-05-31 MED ORDER — VALACYCLOVIR HCL 500 MG PO TABS
1000.0000 mg | ORAL_TABLET | ORAL | Status: AC
  Administered 2020-05-31: 1000 mg via ORAL
  Filled 2020-05-31: qty 2

## 2020-05-31 MED ORDER — VALACYCLOVIR HCL 1 G PO TABS: 1000.0000 mg | ORAL_TABLET | Freq: Three times a day (TID) | ORAL | 0 refills | Status: AC

## 2020-05-31 MED ORDER — FLUORESCEIN SODIUM 1 MG OP STRP
1.0000 | ORAL_STRIP | Freq: Once | OPHTHALMIC | Status: AC
  Administered 2020-05-31: 1 via OPHTHALMIC
  Filled 2020-05-31: qty 1

## 2020-05-31 NOTE — Discharge Instructions (Addendum)
Please go to [facility] retina which is an ophthalmology office here in [place]. I have given you the phone number and address on your discharge paperwork. Please go to their office today at 11am there may be a wait as they are fitting you into the schedule.

## 2020-05-31 NOTE — ED Provider Notes (Signed)
[facility]-EMERGENCY DEPT Provider Note   CSN: [id number] Arrival date & time: 05/31/20  6295     History Chief Complaint  Patient presents with  . Eye Pain    Erik Ward is a 64 y.o. male   HPI  Patient is 64 year old male with past medical history significant for tobacco abuse and no other chronic medical issues presented today with left thigh pain since 2 days ago and rash for the past 7 days.  He states the rash is left-sided not itchy but stinging and painful.  He states he has had no history of shingles in the past but states he did have chickenpox as a child.  He states his eye is red and irritated.  He has noticed some discharge from his left eye.  He is not a contact lens wearer.  He denies any immunosuppressive disease. He states his eye is achy he states it is mild.  Denies any pruritus but does endorse discharge.  Patient previously was using antihistamine eyedrops with no relief.     Past Medical History:  Diagnosis Date  . Subarachnoid hemorrhage following injury, with loss of consciousness (HCC) 09/18/2012   syncope and collapse (09/18/2012)  . Syncope and collapse 09/18/2012   w/loss of consciousness (09/18/2012)    Patient Active Problem List   Diagnosis Date Noted  . Syncope 09/18/2012  . Subarachnoid hemorrhage (HCC) 09/18/2012  . Tobacco abuse 09/18/2012    Past Surgical History:  Procedure Laterality Date  . PERCUTANEOUS PINNING PHALANX FRACTURE OF HAND  1980's   "right" (09/18/2012)       Family History  Problem Relation Age of Onset  . Hypertension Mother   . Colon cancer Father     Social History   Tobacco Use  . Smoking status: Current Every Day Smoker    Packs/day: 0.50    Years: 40.00    Pack years: 20.00    Types: Cigarettes  . Smokeless tobacco: Never Used  . Tobacco comment: 09/18/2012 offered smoking cessation materials; pt declines  Substance Use Topics  . Alcohol use: No  . Drug use: No     Home Medications Prior to Admission medications   Medication Sig Start Date End Date Taking? Authorizing Provider  Multiple Vitamin (MULTIVITAMIN) tablet Take 1 tablet by mouth daily.    [provider]  valACYclovir (VALTREX) 1000 MG tablet Take 1 tablet (1,000 mg total) by mouth 3 (three) times daily for 7 days. 05/31/20 06/07/20  Gailen Shelter, PA    Allergies    Tdap [tetanus-diphth-acell pertussis]  Review of Systems   Review of Systems  Constitutional: Negative for chills and fever.  HENT: Negative for congestion.   Eyes: Positive for pain.  Respiratory: Negative for shortness of breath.   Cardiovascular: Negative for chest pain.  Gastrointestinal: Negative for abdominal pain.  Musculoskeletal: Negative for neck pain.  Skin: Positive for rash.    Physical Exam Updated Vital Signs BP 126/84 (BP Location: Right Arm)   Pulse 70   Temp 98.5 F (36.9 C) (Oral)   Resp 15   Ht 5\' 6"  (1.676 m)   Wt 68 kg   SpO2 94%   BMI 24.21 kg/m   Physical Exam Vitals and nursing note reviewed.  Constitutional:      General: He is not in acute distress.    Appearance: Normal appearance. He is not ill-appearing.  HENT:     Head: Normocephalic and atraumatic.     Nose:  Comments: Vesicular lesion on the tip of the nose--positive Hutchinson sign.    Mouth/Throat:     Mouth: Mucous membranes are moist.  Eyes:     General: No scleral icterus.    Conjunctiva/sclera: Conjunctivae normal.     Comments: Left eye with conjunctival erythema.  Cardiovascular:     Rate and Rhythm: Normal rate.  Pulmonary:     Effort: Pulmonary effort is normal.     Breath sounds: No stridor.  Skin:    General: Skin is warm and dry.     Comments: Vesicular/pustular lesions on an erythematous base noted to the left side of the forehead and face--this rash does not cross midline.  See eye exam  Neurological:     Mental Status: He is alert and oriented to person, place, and time. Mental  status is at baseline.           ED Results / Procedures / Treatments   Labs (all labs ordered are listed, but only abnormal results are displayed) Labs Reviewed - No data to display  EKG None  Radiology No results found.  Procedures Procedures (including critical care time)  Medications Ordered in ED Medications  tetracaine (PONTOCAINE) 0.5 % ophthalmic solution 2 drop (2 drops Left Eye Given by Other 05/31/20 0649)  fluorescein ophthalmic strip 1 strip (1 strip Left Eye Given by Other 05/31/20 0649)  valACYclovir (VALTREX) tablet 1,000 mg (1,000 mg Oral Given 05/31/20 0818)    ED Course  I have reviewed the triage vital signs and the nursing notes.  Pertinent labs & imaging results that were available during my care of the patient were reviewed by me and considered in my medical decision making (see chart for details).  Clinical Course as of May 31 1030  Wed May 31, 2020  0800 I discussed with Dr.  Carmela Rima who is on-call ophthalmology. Patient will follow up in clinic at 11 AM.   [WF]    Clinical Course User Index [WF] Gailen Shelter, PA   MDM Rules/Calculators/A&P                          Patient is 64 year old male with past medical history detailed above presented today with vesicular/pustular rash to the left side of the face with vesicle on the tip of the nose and left eye pain and redness.  Physical exam is notable for scleral injection.  Large motion of the eyes.  He has no significant visual abnormalities however very mild Snellen chart discrepancy between the eyes with mildly decreased left-sided vision.  He states he has not noticed this.  Fluorescein exam does not show dendritic lesions however my concern for herpes zoster ophthalmicus is high therefore after discussion with ophthalmology will have patient follow-up in their office at 11 AM this morning.  Patient given 1 dose of Valtrex and prescription for continued treatment.  Patient is  understanding of return precautions.  He is not immunocompromise.  Will discharge at this time with close follow-up later this morning with Dr. Allena Katz of ophthalmology.  Final Clinical Impression(s) / ED Diagnoses Final diagnoses:  Herpes zoster with ophthalmic complication, unspecified herpes zoster eye disease    Rx / DC Orders ED Discharge Orders         Ordered    valACYclovir (VALTREX) 1000 MG tablet  3 times daily        05/31/20 0804           Fiza Nation,  Rodrigo Ran, PA 05/31/20 1034    Virgina Norfolk, DO 05/31/20 1114

## 2020-05-31 NOTE — ED Triage Notes (Signed)
Patient complaining of left eye pain since last Wednesday. Patient did not have any injury to eye. Eye is reddened and swollen. Patient has a rash around eye.

## 2020-06-28 NOTE — Progress Notes (Signed)
Patient ID: Erik Ward., male   DOB: [birth year], 64 y.o.   MRN: [id number]   Virtual Visit via Telephone Note  I connected with Erik Ward. on 06/29/20 at 10:50 AM EDT by telephone and verified that I am speaking with the correct person using two identifiers.   I discussed the limitations, risks, security and privacy concerns of performing an evaluation and management service by telephone and the availability of in person appointments. I also discussed with the patient that there may be a patient responsible charge related to this service. The patient expressed understanding and agreed to proceed.  PATIENT visit by telephone virtually in the context of Covid-19 pandemic. Patient location:  home My Location:  [facility] office Persons on the call:  Me, the patient;  Carolynne Edouard screened  History of Present Illness: After being seen in ED 05/31/2020 for shingles on 9/1 involving the L face and eye.  He was treated for shingles and also seen by ophthalmology the same day.  Vision is good.  He has f/up with ophthalmologist next Tuesday, Oct 5.  He c/o itching and some burning on his head.  Rash is 90% better.  He completed his meds  From ED A/P: Patient is 64 year old male with past medical history detailed above presented today with vesicular/pustular rash to the left side of the face with vesicle on the tip of the nose and left eye pain and redness.  Physical exam is notable for scleral injection.  Large motion of the eyes.  He has no significant visual abnormalities however very mild Snellen chart discrepancy between the eyes with mildly decreased left-sided vision.  He states he has not noticed this.  Fluorescein exam does not show dendritic lesions however my concern for herpes zoster ophthalmicus is high therefore after discussion with ophthalmology will have patient follow-up in their office at 11 AM this morning.  Patient given 1 dose of Valtrex and prescription for continued  treatment.  Patient is understanding of return precautions.  He is not immunocompromise.  Will discharge at this time with close follow-up later this morning with Dr. Allena Katz of ophthalmology.    Observations/Objective: NAD.  A&Ox3  Assessment and Plan: 1. Post herpetic neuralgia -keep opthal mology appt - gabapentin (NEURONTIN) 300 MG capsule; Take 1 capsule (300 mg total) by mouth at bedtime.  Dispense: 30 capsule; Refill: 1 - cetirizine (ZYRTEC) 10 MG tablet; Take 1 tablet (10 mg total) by mouth daily. Prn itching  Dispense: 30 tablet; Refill: 1  2. Encounter for examination following treatment at hospital Doing well   Follow Up Instructions: See PCP 6-8 weeks   I discussed the assessment and treatment plan with the patient. The patient was provided an opportunity to ask questions and all were answered. The patient agreed with the plan and demonstrated an understanding of the instructions.   The patient was advised to call back or seek an in-person evaluation if the symptoms worsen or if the condition fails to improve as anticipated.  I provided 14 minutes of non-face-to-face time during this encounter.   Georgian Co, PA-C

## 2020-06-29 ENCOUNTER — Other Ambulatory Visit: Payer: Self-pay

## 2020-06-29 ENCOUNTER — Ambulatory Visit: Payer: Medicaid Other | Attending: Physician Assistant | Admitting: Physician Assistant

## 2020-06-29 DIAGNOSIS — Z09 Encounter for follow-up examination after completed treatment for conditions other than malignant neoplasm: Secondary | ICD-10-CM

## 2020-06-29 DIAGNOSIS — B0229 Other postherpetic nervous system involvement: Secondary | ICD-10-CM

## 2020-06-29 MED ORDER — CETIRIZINE HCL 10 MG PO TABS: 10.0000 mg | ORAL_TABLET | Freq: Every day | ORAL | 1 refills | Status: DC

## 2020-06-29 MED ORDER — GABAPENTIN 300 MG PO CAPS: 300.0000 mg | ORAL_CAPSULE | Freq: Every day | ORAL | 1 refills | Status: DC

## 2020-06-29 MED FILL — GABAPENTIN 300 MG CAPSULE: 300 | 30 days supply | Qty: 30 | Fill #0

## 2021-09-17 ENCOUNTER — Ambulatory Visit: Payer: Self-pay

## 2021-09-17 NOTE — Telephone Encounter (Signed)
°  Chief Complaint: SOB Symptoms: SOB, cough Frequency: 1 month Pertinent Negatives: Patient denies chest pain, fever Disposition: [] ED /[x] Urgent Care (no appt availability in office) / [] Appointment(In office/virtual)/ []  [facility]/ [] Home Care/ [] Refused Recommended Disposition  Additional Notes: Reached out to [place], [facility] via teams to ensure no appts available. Advised and schedule pt appt at [facility] for 09/18/21 at 1100 per pt request. I advised him to be seen today at [facility] but pt had some things to do. Care advice given and pt verbalized understanding. No other questions/concerns noted.    Reason for Disposition  [1] MILD difficulty breathing (e.g., minimal/no SOB at rest, SOB with walking, pulse <100) AND [2] NEW-onset or WORSE than normal  Answer Assessment - Initial Assessment Questions 1. RESPIRATORY STATUS: "Describe your breathing?" (e.g., wheezing, shortness of breath, unable to speak, severe coughing)      Worse in the morning, hard to catch breathe 2. ONSET: "When did this breathing problem begin?"      A month 3. PATTERN "Does the difficult breathing come and go, or has it been constant since it started?"      Comes and go 4. SEVERITY: "How bad is your breathing?" (e.g., mild, moderate, severe)    - MILD: No SOB at rest, mild SOB with walking, speaks normally in sentences, can lie down, no retractions, pulse < 100.    - MODERATE: SOB at rest, SOB with minimal exertion and prefers to sit, cannot lie down flat, speaks in phrases, mild retractions, audible wheezing, pulse 100-120.    - SEVERE: Very SOB at rest, speaks in single words, struggling to breathe, sitting hunched forward, retractions, pulse > 120      moderate 5. RECURRENT SYMPTOM: "Have you had difficulty breathing before?" If Yes, ask: "When was the last time?" and "What happened that time?"      No 7. LUNG HISTORY: "Do you have any history of lung disease?"  (e.g., pulmonary embolus, asthma,  emphysema)     No 9. OTHER SYMPTOMS: "Do you have any other symptoms? (e.g., dizziness, runny nose, cough, chest pain, fever)     Persistent cough  Protocols used: Breathing Difficulty-A-AH

## 2021-09-18 ENCOUNTER — Ambulatory Visit
Admission: RE | Admit: 2021-09-18 | Discharge: 2021-09-18 | Disposition: A | Discharge status: Home / Self Care | Payer: Medicare Other | Source: Ambulatory Visit

## 2021-09-18 ENCOUNTER — Other Ambulatory Visit: Payer: Self-pay

## 2021-09-18 VITALS — BP 144/91 | HR 77 | Temp 97.5°F | Resp 16

## 2021-09-18 DIAGNOSIS — R0902 Hypoxemia: Secondary | ICD-10-CM

## 2021-09-18 NOTE — ED Provider Notes (Signed)
Patient here today with wife for evaluation of shortness of breath this been increasing over the last month.  He is a current smoker.  He does not have official diagnosis of COPD.  Today in office oxygen saturation was 88% at rest.  Recommended further evaluation in the ED as I suspect he will need stat labs and imaging.  Wife and patient agree and wife will transport him to the emergency department from our office.   Tomi Bamberger, PA-C 09/18/21 1118

## 2021-09-18 NOTE — ED Triage Notes (Signed)
Shortness of breath with a cough increasing over the last month. Reports nasal congestion, stuffy nose. Denies headache, N/V/D

## 2022-11-28 DIAGNOSIS — H35371 Puckering of macula, right eye: Secondary | ICD-10-CM | POA: Diagnosis not present

## 2022-11-28 DIAGNOSIS — H43822 Vitreomacular adhesion, left eye: Secondary | ICD-10-CM | POA: Diagnosis not present

## 2022-11-28 DIAGNOSIS — H35072 Retinal telangiectasis, left eye: Secondary | ICD-10-CM | POA: Diagnosis not present

## 2022-11-28 DIAGNOSIS — H25813 Combined forms of age-related cataract, bilateral: Secondary | ICD-10-CM | POA: Diagnosis not present

## 2022-11-28 DIAGNOSIS — H3509 Other intraretinal microvascular abnormalities: Secondary | ICD-10-CM | POA: Diagnosis not present

## 2022-12-09 ENCOUNTER — Emergency Department (HOSPITAL_COMMUNITY): Payer: 59

## 2022-12-09 ENCOUNTER — Other Ambulatory Visit: Payer: Self-pay

## 2022-12-09 ENCOUNTER — Encounter (HOSPITAL_COMMUNITY): Payer: Self-pay

## 2022-12-09 ENCOUNTER — Ambulatory Visit: Payer: Self-pay | Admitting: *Deleted

## 2022-12-09 ENCOUNTER — Inpatient Hospital Stay (HOSPITAL_COMMUNITY)
Admission: EM | Admit: 2022-12-09 | Discharge: 2022-12-13 | DRG: 175 | Disposition: A | Discharge status: Home / Self Care | Payer: 59 | Attending: Internal Medicine | Admitting: Internal Medicine

## 2022-12-09 DIAGNOSIS — Z79899 Other long term (current) drug therapy: Secondary | ICD-10-CM | POA: Diagnosis not present

## 2022-12-09 DIAGNOSIS — D751 Secondary polycythemia: Secondary | ICD-10-CM | POA: Diagnosis not present

## 2022-12-09 DIAGNOSIS — J441 Chronic obstructive pulmonary disease with (acute) exacerbation: Secondary | ICD-10-CM | POA: Diagnosis present

## 2022-12-09 DIAGNOSIS — J9601 Acute respiratory failure with hypoxia: Secondary | ICD-10-CM | POA: Diagnosis present

## 2022-12-09 DIAGNOSIS — I2699 Other pulmonary embolism without acute cor pulmonale: Secondary | ICD-10-CM

## 2022-12-09 DIAGNOSIS — Z86711 Personal history of pulmonary embolism: Secondary | ICD-10-CM

## 2022-12-09 DIAGNOSIS — I2609 Other pulmonary embolism with acute cor pulmonale: Secondary | ICD-10-CM | POA: Diagnosis not present

## 2022-12-09 DIAGNOSIS — E874 Mixed disorder of acid-base balance: Secondary | ICD-10-CM | POA: Diagnosis not present

## 2022-12-09 DIAGNOSIS — Z7901 Long term (current) use of anticoagulants: Secondary | ICD-10-CM

## 2022-12-09 DIAGNOSIS — F17210 Nicotine dependence, cigarettes, uncomplicated: Secondary | ICD-10-CM | POA: Diagnosis not present

## 2022-12-09 DIAGNOSIS — B348 Other viral infections of unspecified site: Secondary | ICD-10-CM | POA: Diagnosis not present

## 2022-12-09 DIAGNOSIS — I50813 Acute on chronic right heart failure: Secondary | ICD-10-CM | POA: Diagnosis not present

## 2022-12-09 DIAGNOSIS — J432 Centrilobular emphysema: Secondary | ICD-10-CM | POA: Diagnosis not present

## 2022-12-09 DIAGNOSIS — E1165 Type 2 diabetes mellitus with hyperglycemia: Secondary | ICD-10-CM | POA: Diagnosis not present

## 2022-12-09 DIAGNOSIS — Z1152 Encounter for screening for COVID-19: Secondary | ICD-10-CM | POA: Diagnosis not present

## 2022-12-09 DIAGNOSIS — I351 Nonrheumatic aortic (valve) insufficiency: Secondary | ICD-10-CM | POA: Diagnosis not present

## 2022-12-09 DIAGNOSIS — K529 Noninfective gastroenteritis and colitis, unspecified: Secondary | ICD-10-CM | POA: Diagnosis not present

## 2022-12-09 DIAGNOSIS — Z8679 Personal history of other diseases of the circulatory system: Secondary | ICD-10-CM | POA: Diagnosis not present

## 2022-12-09 DIAGNOSIS — I2602 Saddle embolus of pulmonary artery with acute cor pulmonale: Secondary | ICD-10-CM | POA: Diagnosis not present

## 2022-12-09 DIAGNOSIS — I110 Hypertensive heart disease with heart failure: Secondary | ICD-10-CM | POA: Diagnosis not present

## 2022-12-09 DIAGNOSIS — R634 Abnormal weight loss: Secondary | ICD-10-CM | POA: Diagnosis not present

## 2022-12-09 DIAGNOSIS — F17200 Nicotine dependence, unspecified, uncomplicated: Secondary | ICD-10-CM | POA: Diagnosis present

## 2022-12-09 DIAGNOSIS — Z833 Family history of diabetes mellitus: Secondary | ICD-10-CM | POA: Diagnosis not present

## 2022-12-09 DIAGNOSIS — Z9181 History of falling: Secondary | ICD-10-CM | POA: Diagnosis not present

## 2022-12-09 DIAGNOSIS — I5023 Acute on chronic systolic (congestive) heart failure: Secondary | ICD-10-CM | POA: Diagnosis not present

## 2022-12-09 DIAGNOSIS — R0602 Shortness of breath: Secondary | ICD-10-CM | POA: Diagnosis not present

## 2022-12-09 DIAGNOSIS — Z8249 Family history of ischemic heart disease and other diseases of the circulatory system: Secondary | ICD-10-CM

## 2022-12-09 DIAGNOSIS — Z6821 Body mass index (BMI) 21.0-21.9, adult: Secondary | ICD-10-CM | POA: Diagnosis not present

## 2022-12-09 DIAGNOSIS — I50812 Chronic right heart failure: Secondary | ICD-10-CM | POA: Diagnosis not present

## 2022-12-09 DIAGNOSIS — I509 Heart failure, unspecified: Secondary | ICD-10-CM

## 2022-12-09 DIAGNOSIS — R197 Diarrhea, unspecified: Secondary | ICD-10-CM | POA: Insufficient documentation

## 2022-12-09 LAB — CBC WITH DIFFERENTIAL/PLATELET
Abs Immature Granulocytes: 0.03 10*3/uL (ref 0.00–0.07)
Basophils Absolute: 0 10*3/uL (ref 0.0–0.1)
Basophils Relative: 0 %
Eosinophils Absolute: 0 10*3/uL (ref 0.0–0.5)
Eosinophils Relative: 0 %
HCT: 52.5 % — ABNORMAL HIGH (ref 39.0–52.0)
Hemoglobin: 17.2 g/dL — ABNORMAL HIGH (ref 13.0–17.0)
Immature Granulocytes: 1 %
Lymphocytes Relative: 16 %
Lymphs Abs: 0.9 10*3/uL (ref 0.7–4.0)
MCH: 31.6 pg (ref 26.0–34.0)
MCHC: 32.8 g/dL (ref 30.0–36.0)
MCV: 96.3 fL (ref 80.0–100.0)
Monocytes Absolute: 0.6 10*3/uL (ref 0.1–1.0)
Monocytes Relative: 10 %
Neutro Abs: 4 10*3/uL (ref 1.7–7.7)
Neutrophils Relative %: 73 %
Platelets: 251 10*3/uL (ref 150–400)
RBC: 5.45 MIL/uL (ref 4.22–5.81)
RDW: 14.9 % (ref 11.5–15.5)
WBC: 5.4 10*3/uL (ref 4.0–10.5)
nRBC: 0 % (ref 0.0–0.2)

## 2022-12-09 LAB — RESPIRATORY PANEL BY PCR

## 2022-12-09 LAB — BASIC METABOLIC PANEL
Anion gap: 11 (ref 5–15)
BUN: 26 mg/dL — ABNORMAL HIGH (ref 8–23)
CO2: 24 mmol/L (ref 22–32)
Calcium: 8.7 mg/dL — ABNORMAL LOW (ref 8.9–10.3)
Chloride: 101 mmol/L (ref 98–111)
Creatinine, Ser: 1.04 mg/dL (ref 0.61–1.24)
GFR, Estimated: >60 mL/min (ref >60)
Glucose, Bld: 99 mg/dL (ref 70–99)
Potassium: 4.2 mmol/L (ref 3.5–5.1)
Sodium: 136 mmol/L (ref 135–145)

## 2022-12-09 LAB — SEDIMENTATION RATE: Sed Rate: 1 mm/hr (ref 0–16)

## 2022-12-09 LAB — RESP PANEL BY RT-PCR (RSV, FLU A&B, COVID)  RVPGX2
Influenza A by PCR: NEGATIVE
Influenza B by PCR: NEGATIVE
Resp Syncytial Virus by PCR: NEGATIVE
SARS Coronavirus 2 by RT PCR: NEGATIVE

## 2022-12-09 LAB — C-REACTIVE PROTEIN: CRP: 10.2 mg/dL — ABNORMAL HIGH (ref <1.0)

## 2022-12-09 LAB — TROPONIN I (HIGH SENSITIVITY)
Troponin I (High Sensitivity): 57 ng/L — ABNORMAL HIGH (ref <18)
Troponin I (High Sensitivity): 46 ng/L — ABNORMAL HIGH (ref <18)

## 2022-12-09 LAB — TSH: TSH: 0.919 u[IU]/mL (ref 0.350–4.500)

## 2022-12-09 LAB — BRAIN NATRIURETIC PEPTIDE: B Natriuretic Peptide: 1787.5 pg/mL — ABNORMAL HIGH (ref 0.0–100.0)

## 2022-12-09 MED ORDER — ENOXAPARIN SODIUM 40 MG/0.4ML IJ SOSY
40.0000 mg | PREFILLED_SYRINGE | INTRAMUSCULAR | Status: DC
  Administered 2022-12-09 – 2022-12-10 (×2): 40 mg via SUBCUTANEOUS
  Filled 2022-12-09 (×2): qty 0.4

## 2022-12-09 MED ORDER — IPRATROPIUM-ALBUTEROL 0.5-2.5 (3) MG/3ML IN SOLN
3.0000 mL | RESPIRATORY_TRACT | Status: AC
  Administered 2022-12-09 – 2022-12-10 (×5): 3 mL via RESPIRATORY_TRACT
  Filled 2022-12-09 (×6): qty 3

## 2022-12-09 MED ORDER — ACETAMINOPHEN 325 MG PO TABS: 650.0000 mg | ORAL_TABLET | Freq: Four times a day (QID) | ORAL | Status: DC | PRN

## 2022-12-09 MED ORDER — ACETAMINOPHEN 650 MG RE SUPP: 650.0000 mg | Freq: Four times a day (QID) | RECTAL | Status: DC | PRN

## 2022-12-09 MED ORDER — FUROSEMIDE 10 MG/ML IJ SOLN
40.0000 mg | Freq: Once | INTRAMUSCULAR | Status: AC
  Administered 2022-12-09: 40 mg via INTRAVENOUS
  Filled 2022-12-09: qty 4

## 2022-12-09 MED ORDER — PREDNISONE 20 MG PO TABS
40.0000 mg | ORAL_TABLET | Freq: Every day | ORAL | Status: DC
  Administered 2022-12-09 – 2022-12-11 (×3): 40 mg via ORAL
  Filled 2022-12-09 (×3): qty 2

## 2022-12-09 MED ORDER — IPRATROPIUM-ALBUTEROL 0.5-2.5 (3) MG/3ML IN SOLN
3.0000 mL | Freq: Once | RESPIRATORY_TRACT | Status: AC
  Administered 2022-12-09: 3 mL via RESPIRATORY_TRACT
  Filled 2022-12-09: qty 3

## 2022-12-09 NOTE — Telephone Encounter (Signed)
Reason for Disposition  [1] MODERATE difficulty breathing (e.g., speaks in phrases, SOB even at rest, pulse 100-120) AND [2] NEW-onset or WORSE than normal  Answer Assessment - Initial Assessment Questions 1. RESPIRATORY STATUS: "Describe your breathing?" (e.g., wheezing, shortness of breath, unable to speak, severe coughing)      SOB-coughing 2. ONSET: "When did this breathing problem begin?"      Over 1 week 3. PATTERN "Does the difficult breathing come and go, or has it been constant since it started?"      constant 4. SEVERITY: "How bad is your breathing?" (e.g., mild, moderate, severe)    - MILD: No SOB at rest, mild SOB with walking, speaks normally in sentences, can lie down, no retractions, pulse < 100.    - MODERATE: SOB at rest, SOB with minimal exertion and prefers to sit, cannot lie down flat, speaks in phrases, mild retractions, audible wheezing, pulse 100-120.    - SEVERE: Very SOB at rest, speaks in single words, struggling to breathe, sitting hunched forward, retractions, pulse > 120      moderate 5. RECURRENT SYMPTOM: "Have you had difficulty breathing before?" If Yes, ask: "When was the last time?" and "What happened that time?"      Not new- but has never been treated 6. CARDIAC HISTORY: "Do you have any history of heart disease?" (e.g., heart attack, angina, bypass surgery, angioplasty)      no 7. LUNG HISTORY: "Do you have any history of lung disease?"  (e.g., pulmonary embolus, asthma, emphysema)     no 8. CAUSE: "What do you think is causing the breathing problem?"      unsure 9. OTHER SYMPTOMS: "Do you have any other symptoms? (e.g., dizziness, runny nose, cough, chest pain, fever)     Cough- phelm is clear, chest pain- sore  Protocols used: Breathing Difficulty-A-AH

## 2022-12-09 NOTE — ED Provider Notes (Signed)
[facility] Provider Note   CSN: [id number] Arrival date & time: 12/09/22  1413     History  Chief Complaint  Patient presents with   Shortness of Breath    Red Erik Regehr. is a 67 y.o. male with a past medical history of subarachnoid hemorrhage following injury 2013 presents today for evaluation of shortness of breath.  Patient reports he has had ongoing shortness of breath in the last 3 months.  Patient states it got worse with exertion and laying down.  He reports recent swelling to his ankles bilaterally.  He endorses productive cough at home with flank pain given the cough.  Denies fever, chest pain, nausea, vomiting, bowel changes, urinary symptoms.  Denies starting on any new medications.  Patient does not wear oxygen at home.  He was satting at 72% on room air in triage, was given DuoNeb and placed on 6 L of oxygen through nasal cannula.  He denies any recent travel or surgery.  He is not taking any blood thinners.  No personal or family history of blood clots.  Shortness of Breath   Past Medical History:  Diagnosis Date   Subarachnoid hemorrhage following injury, with loss of consciousness ([place]) 09/18/2012   syncope and collapse (09/18/2012)   Syncope and collapse 09/18/2012   w/loss of consciousness (09/18/2012)   Past Surgical History:  Procedure Laterality Date   PERCUTANEOUS PINNING PHALANX FRACTURE OF HAND  1980's   "right" (09/18/2012)     Home Medications Prior to Admission medications   Medication Sig Start Date End Date Taking? Authorizing Provider  cetirizine (ZYRTEC) 10 MG tablet Take 1 tablet (10 mg total) by mouth daily. Prn itching 06/29/20   Argentina Donovan, PA-C  gabapentin (NEURONTIN) 300 MG capsule Take 1 capsule (300 mg total) by mouth at bedtime. 06/29/20   Argentina Donovan, PA-C  Multiple Vitamin (MULTIVITAMIN) tablet Take 1 tablet by mouth daily.    [provider]      Allergies     Tdap [tetanus-diphth-acell pertussis]    Review of Systems   Review of Systems  Respiratory:  Positive for shortness of breath.     Physical Exam Updated Vital Signs BP 111/85   Pulse 77   Temp (!) 97.5 F (36.4 C) (Oral)   Resp 15   Ht '5\' 7"'$  (1.702 m)   Wt 68 kg   SpO2 95%   BMI 23.49 kg/m  Physical Exam Vitals and nursing note reviewed.  Constitutional:      Appearance: Normal appearance.  HENT:     Head: Normocephalic and atraumatic.     Mouth/Throat:     Mouth: Mucous membranes are moist.  Eyes:     General: No scleral icterus. Cardiovascular:     Rate and Rhythm: Normal rate and regular rhythm.     Pulses: Normal pulses.     Heart sounds: Normal heart sounds.  Pulmonary:     Effort: Pulmonary effort is normal.     Breath sounds: Wheezing present.  Abdominal:     General: Abdomen is flat.     Palpations: Abdomen is soft.     Tenderness: There is no abdominal tenderness.  Musculoskeletal:        General: No deformity.  Skin:    General: Skin is warm.     Findings: No rash.  Neurological:     General: No focal deficit present.     Mental Status: He is alert.  Psychiatric:  Mood and Affect: Mood normal.     ED Results / Procedures / Treatments   Labs (all labs ordered are listed, but only abnormal results are displayed) Labs Reviewed  BASIC METABOLIC PANEL - Abnormal; Notable for the following components:      Result Value   BUN 26 (*)    Calcium 8.7 (*)    All other components within normal limits  CBC WITH DIFFERENTIAL/PLATELET - Abnormal; Notable for the following components:   Hemoglobin 17.2 (*)    HCT 52.5 (*)    All other components within normal limits  BRAIN NATRIURETIC PEPTIDE - Abnormal; Notable for the following components:   B Natriuretic Peptide 1,787.5 (*)    All other components within normal limits  TROPONIN I (HIGH SENSITIVITY) - Abnormal; Notable for the following components:   Troponin I (High Sensitivity) 57 (*)     All other components within normal limits  TROPONIN I (HIGH SENSITIVITY)    EKG None  Radiology DG Chest Port 1 View  Result Date: 12/09/2022 CLINICAL DATA:  Shortness of breath EXAM: PORTABLE CHEST 1 VIEW COMPARISON:  None FINDINGS: No pleural effusion. No pneumothorax. No focal airspace opacity. Normal cardiac and mediastinal contours. No radiographically apparent displaced rib fractures. Visualized upper abdomen is unremarkable. IMPRESSION: No acute cardiopulmonary disease. Electronically Signed   By: Marin Roberts M.D.   On: 12/09/2022 16:21    Procedures Procedures    Medications Ordered in ED Medications  ipratropium-albuterol (DUONEB) 0.5-2.5 (3) MG/3ML nebulizer solution 3 mL (3 mLs Nebulization Given 12/09/22 1507)  furosemide (LASIX) injection 40 mg (40 mg Intravenous Given 12/09/22 1724)    ED Course/ Medical Decision Making/ A&P                             Medical Decision Making  This patient presents to the ED for shortness of breath, this involves an extensive number of treatment options, and is a complaint that carries with a high risk of complications and morbidity.  The differential diagnosis includes CHF/ACS, COPD asthma, pneumonia, anaphylaxis, PE, pneumothorax, anxiety, COVID-19.  This is not an exhaustive list.  Lab tests: I ordered and personally interpreted labs.  The pertinent results include: WBC unremarkable. Hbg unremarkable. Platelets unremarkable. Electrolytes unremarkable. BUN, creatinine unremarkable.  BNP above 1700.  Troponin 57.  Imaging studies: I ordered imaging studies. I personally reviewed, interpreted imaging and agree with the radiologist's interpretations. The results include: Chest x-ray showed no acute abnormality  Problem list/ ED course/ Critical interventions/ Medical management: HPI: See above Vital signs within normal range and stable throughout visit. Laboratory/imaging studies significant for: See above. On physical  examination, patient is afebrile and appears in no acute distress. This patient presents with dyspnea, most likely secondary to CHF. Presentation not consistent with acute cardiac etiologies to include ACS (non ischemic ekg, no chest pain), pericardial effusion / tamponade. Presentation not consistent with acute respiratory etiologies to include acute PE (Wells low risk), pneumothorax , asthma, COPD exacerbation, allergic etiologies, or infectious etiologies such as PNA. Presentation also not consistent with non-cardiopulmonary causes to include toxidromes, metabolic etiologies such as acidemia or electrolyte derangements, sepsis, neurologic causes (i.e. demyelinating diseases).  Patient also presents with bilateral knee swelling which is possibly secondary to fluid overload.  Based on patient's clinical presentations and laboratory/imaging studies I suspect CHF exacerbation.  Lasix ordered.  DuoNeb ordered due to wheezing.  Patient will require hospitalization for hypoxia with new oxygen requirement  secondary to CHF. I have reviewed the patient home medicines and have made adjustments as needed.  Cardiac monitoring/EKG: The patient was maintained on a cardiac monitor.  I personally reviewed and interpreted the cardiac monitor which showed an underlying rhythm of: sinus rhythm.  Additional history obtained: External records from outside source obtained and reviewed including: Chart review including previous notes, labs, imaging.  Consultations obtained: I requested consultation with Dr. Posey Pronto, and discussed lab and imaging findings as well as pertinent plan.  He agreed to admit the patient.  Disposition Patient is admitted to the hospital. This chart was dictated using voice recognition software.  Despite best efforts to proofread,  errors can occur which can change the documentation meaning.          Final Clinical Impression(s) / ED Diagnoses Final diagnoses:  Congestive heart failure,  unspecified HF chronicity, unspecified heart failure type [facility])    Rx / DC Orders ED Discharge Orders     None         Rex Kras, [place] 12/09/22 1756    Leanord Asal K, DO 12/10/22 0008

## 2022-12-09 NOTE — Telephone Encounter (Addendum)
Noted. Patient has not been seen in this office since 12/16/2019.

## 2022-12-09 NOTE — H&P (Cosign Needed Addendum)
Date: 12/09/2022               Patient Name:  Erik [facility]. MRN: [id number]  DOB: Oct 24, 1955 Age / Sex: 67 y.o., male   PCP: Antony Blackbird, MD         Medical Service: Internal Medicine Teaching Service         Attending Physician: Dr. Velna Ochs, MD      First Contact: Dr. Leigh Aurora, DO Pager [phone]    Second Contact: Dr. Gaylan Gerold, DO Pager [phone]         After Hours (After 5p/  First Contact Pager: [phone]  weekends / holidays): Second Contact Pager: [phone]   SUBJECTIVE   Chief Complaint: Shortness of breath   History of Present Illness: This is a 67 year old male with a past medical history of subarachnoid hemorrhage who presents to the emergency department with concerns of 25-monthhistory of exertional dyspnea, productive cough, and mucus diarrhea.  He states that over the past 3 months he has had shortness of breath that has been persistent.  He denies any chest pain.  He states that he gets short of breath with normal daily activities such as bathing and dressing.  He states he is short of breath from walking from the kitchen to the bathroom.  He denies any episodes of presyncope or lightheadedness.  He states he has started to use 3-4 pillows at night, when he used to only use 2 pillows.  He states that his cough is productive with clear sputum.  He denies any fever or chills.  He denies any recent travel, car rides or long plane rides.  He denies any leg pain.  Regarding his diarrhea, he is had about 2 months of 8-9 bowel movements a day.  He also reports times where he is incontinent.  He denies any abdominal pain, blood, melena.  He does state that sometimes his stool is formed but mostly watery with mucus.  He is unsure about any trigger foods.  He also reports he has been losing weight and had low appetite. He not paid attention to see if his legs have been swollen or not.  He states that he has gotten tired of being tired, and therefore decided to present to  the emergency room with his wife at bedside.  ED Course: Patient initially presented to the emergency room with complaints of shortness of breath.  Initial vital signs showed patient hypoxic at 72% SpO2 on room air.  Blood pressure within normal limits.  Patient is afebrile.  Respirations at mid 252s  Patient was started on 6 L nasal cannula and oxygen saturations increased into the 90s.  Initial labs showing hemoglobin 17.2 hematocrit 52.5, BNP 1700, troponin elevated at 57.  Initial chest x-ray showing hyperinflation, but no other acute cardiopulmonary process.  Given requirement for oxygen, IMTS consulted for admission.  Past Medical History Past Medical History:  Diagnosis Date   Subarachnoid hemorrhage following injury, with loss of consciousness [place] 09/18/2012   syncope and collapse (09/18/2012)   Syncope and collapse 09/18/2012   w/loss of consciousness (09/18/2012)     Meds:  No home medications  Past Surgical History  Past Surgical History:  Procedure Laterality Date   PERCUTANEOUS PINNING PHALANX FRACTURE OF HAND  1980's   "right" (09/18/2012)   Social:  Lives With: Lives with wife at home Occupation: Retired cDiplomatic Services operational officerSupport: GDoristine Devoidsupport at home with wife, independent in all decisions Level of Function: Independent in all ADLs  and IADLs PCP: Dr. Antony Blackbird, MD  Substances: 25-year pack history, no alcohol or drugs  Family History:  Mother: Diabetes mellitus Maternal uncle: Died of a heart attack in 34s Brother: Died of heart attack in 49s Sister: Malignancy  Allergies: Allergies as of 12/09/2022 - Review Complete 12/09/2022  Allergen Reaction Noted   Tdap [tetanus-diphth-acell pertussis]  12/12/2017    Review of Systems: A complete ROS was negative except as per HPI.   OBJECTIVE:   Physical Exam: Blood pressure 111/85, pulse 77, temperature (!) 97.5 F (36.4 C), temperature source Oral, resp. rate 15, height '5\' 7"'$  (1.702 m), weight 68 kg,  SpO2 95 %.  Constitutional: Resting in bed with nasal cannula in place HENT: normocephalic atraumatic Eyes: conjunctiva non-erythematous Neck: supple, no JVD Cardiovascular: Distant heart sounds, regular rate and rhythm Pulmonary/Chest: Increased work of breathing on 4 L nasal cannula.  Decreased lung sounds throughout.  Wheezing noted throughout. Abdominal: soft, non-tender, non-distended, normoactive bowel sounds Extremities: 1+ pitting edema noted to bilateral ankles, faint pulses appreciated  Labs: CBC    Component Value Date/Time   WBC 5.4 12/09/2022 1503   RBC 5.45 12/09/2022 1503   HGB 17.2 (H) 12/09/2022 1503   HGB 15.5 09/02/2018 1041   HCT 52.5 (H) 12/09/2022 1503   HCT 45.6 09/02/2018 1041   PLT 251 12/09/2022 1503   PLT 269 09/02/2018 1041   MCV 96.3 12/09/2022 1503   MCV 90 09/02/2018 1041   MCH 31.6 12/09/2022 1503   MCHC 32.8 12/09/2022 1503   RDW 14.9 12/09/2022 1503   RDW 13.7 09/02/2018 1041   LYMPHSABS 0.9 12/09/2022 1503   LYMPHSABS 1.4 09/02/2018 1041   MONOABS 0.6 12/09/2022 1503   EOSABS 0.0 12/09/2022 1503   EOSABS 0.1 09/02/2018 1041   BASOSABS 0.0 12/09/2022 1503   BASOSABS 0.1 09/02/2018 1041     CMP     Component Value Date/Time   NA 136 12/09/2022 1503   NA 139 12/01/2019 1614   K 4.2 12/09/2022 1503   CL 101 12/09/2022 1503   CO2 24 12/09/2022 1503   GLUCOSE 99 12/09/2022 1503   BUN 26 (H) 12/09/2022 1503   BUN 12 12/01/2019 1614   CREATININE 1.04 12/09/2022 1503   CREATININE 1.03 07/31/2016 1137   CALCIUM 8.7 (L) 12/09/2022 1503   PROT 7.0 09/02/2018 1041   ALBUMIN 4.3 09/02/2018 1041   AST 16 09/02/2018 1041   ALT 10 09/02/2018 1041   ALKPHOS 75 09/02/2018 1041   BILITOT 0.6 09/02/2018 1041   GFRNONAA >60 12/09/2022 1503   GFRNONAA 83 10/29/2013 1628   GFRAA 71 12/01/2019 1614   GFRAA >89 10/29/2013 1628    Imaging: Chest x-ray: Hyperinflation noted.  No acute cardiopulmonary process noted  EKG: EKG showing normal sinus  rhythm rate of 76, with right axis deviation.  There is some noticeable T wave inversions in the anterior leads.  Also seems to be ST segment depression in V3.  ASSESSMENT & PLAN:   Assessment & Plan by Problem: Principal Problem:   Acute respiratory failure with hypoxia (HCC) Active Problems:   Tobacco use disorder   Diarrhea   Erik [facility]. is a 67 y.o. male with a history of subarachnoid hemorrhage presenting to the hospital with concerns of shortness of breath, diarrhea, and fatigue.  Patient admitted for further evaluation and management of acute hypoxic respiratory failure.  #Acute hypoxic respiratory failure likely secondary to COPD exacerbation versus acute heart failure exacerbation Patient presents with 2-monthhistory of  shortness of breath and productive cough.  Patient has not been regularly followed by a physician.  On exam today, patient has decreased lung sounds bilaterally with wheezing heard throughout.  Patient does not seem clinically significantly volume overloaded at this time.  Patient had required 6 L nasal cannula to maintain O2 saturation greater than 92% during ED course.  With no fevers or chills and absent chest x-ray findings, not concern for pneumonia.  Patient does not have anemia causing shortness of breath or hypoxia.  Patient has not had any recent extended car or plane rides and no concerns of leg pain making PE less likely.  Given significant history of smoking and hyperinflation on chest x-ray, concern for COPD exacerbation.  Given history of orthopnea and exertional dyspnea with exam findings of 1+ pitting edema as well as BNP of 1700, concern for acute heart failure exacerbation. Will continue to workup.  Per chart review, patient did have syncopal episode back in 2013, in which physician at that time was suspicious for cardiac syncope in which this was never followed up patient.  Patient did receive dose of Lasix in the emergency room, will hold off on  further Lasix at this time. -Start prednisone 40 mg daily day 1 of 5 -DuoNebs every 4 hours -Obtain echocardiogram -Hold azithromycin given diarrhea -Continue to wean off oxygen to maintain SpO2 greater than 92% -Respiratory panel pending -Daily weights -Strict I's and O's -Patient will benefit from PFTs outpatient to evaluate for COPD -Continue to monitor respiratory status -Monitor fever curve  #Troponeimia  Elevated trops likely demand -Repeat troponin   #Chronic diarrhea Patient has 34-monthhistory of chronic diarrhea.  Patient has 8-9 bowel movements a day that are loose with mucus.  Patient denies any bloody bowel movements.  He states at times he gets incontinent.  He denies any recent travel.  He denies any fevers or chills.  On exam, patient does not have any abdominal tenderness or any abdominal distention.  Patient does have normoactive bowel sounds.  Current etiology could be infectious versus autoimmune.  Will obtain further testing.  Differential large with IBD, infection, celiac disease, microscopic colitis, pancreatic insufficiency or hyperthyroidism. -Follow-up CRP, TTG IgA, Glia antibodies, fecal occult blood test, pancreatic elastase, ESR, TSH, GI panel  #Erythrocytosis Hemoglobin elevated at 17 on arrival.  Patient is a daily smoker, and likely contributing. -Monitor CBC  #Prediabetes Patient with past medical history of prediabetes.  Per chart review patient's most recent A1c greater than 3 years ago at 6.2. -Repeat A1c while inpatient -Monitor BMP  #History of Subarachnoid hemorrhage  Patient does have a history of subarachnoid hemorrhage in 2013.  No acute concerns. -Continue to monitor  Diet: Heart Healthy VTE: Enoxaparin IVF: None,None Code: Full  Prior to Admission Living Arrangement: Home, living with wife Anticipated Discharge Location: Home Barriers to Discharge: Clinical improvement  Dispo: Admit patient to Observation with expected length of  stay less than 2 midnights.  Signed: PLeigh Aurora DO Internal Medicine Resident PGY-1 Pager: [phone]/08/2023, 6:30 PM   On weekends or after 5pm please page on call intern or resident: First contact: [phone] no answer in 15 minutes, please contact senior pager at [phone]

## 2022-12-09 NOTE — ED Provider Triage Note (Signed)
Emergency Medicine Provider Triage Evaluation Note  Pike Avella. , a 67 y.o. male  was evaluated in triage.  Pt complains of SHOB, smoker, no known lung history.  Review of Systems  Positive:  Negative:   Physical Exam  BP 127/88   Pulse 96   Temp (!) 97.5 F (36.4 C) (Oral)   Resp 18   SpO2 (!) 72%  Gen:   Awake Resp:  Pursed lip breathing, tachypneic MSK:   Moves extremities without difficulty  Other:  No lower extremity edema  Medical Decision Making  Medically screening exam initiated at 3:01 PM.  Appropriate orders placed.  [employer]. was informed that the remainder of the evaluation will be completed by another provider, this initial triage assessment does not replace that evaluation, and the importance of remaining in the ED until their evaluation is complete.  Seen at [facility] 09/18/21 with O2 88% on RA, recommended ER eval at that time.    Tacy Learn, PA-C 12/09/22 1502

## 2022-12-09 NOTE — Hospital Course (Addendum)
Erik [facility]. is a 67 y.o. male with a history of subarachnoid hemorrhage presenting to the hospital with concerns of shortness of breath, diarrhea, and fatigue.  Patient admitted for further evaluation and management of acute hypoxic respiratory failure.   #Acute hypoxic respiratory failure likely secondary to unprovoked subacute high risk submassive pulmonary embolism #Subacute right heart failure #Parainfluenza infection #Suspected COPD exacerbation Patient presents with 52-month history of shortness of breath and productive cough. Patient has not been regularly followed by a physician.  Initially thought this was due to COPD exacerbation likely secondary to parainfluenza infection.  Did obtain CT chest abdomen pelvis to rule out any other underlying malignancy, found to have left main pulmonary artery embolism.  Patient was started on heparin, and transition to Xarelto.  Patient did require oxygen up to 6 L during hospitalization.  Patient to be discharged on 3 L oxygen.  Patient also followed by cardiology and pulmonology.  Patient likely will need anticoagulation lifelong.  Patient discharged on LAMA, LABA, ICS therapy.  Did not receive any thrombolytics during hospitalization.  Patient remained hemodynamically stable.   #Troponeimia  Patient had elevated troponins initially on presentation.  This is likely due to demand in the setting of above problem.   #Chronic diarrhea Patient scented to the emergency room with concerns of chronic diarrhea.  Patient was worked up for infectious etiology as well as other autoimmune etiology.  Still working up for pancreatic insufficiency, and patient would benefit with colonoscopy in the outpatient setting.   #Erythrocytosis Patient initially presented with hemoglobin at 17.  It went back down to normal.  Erythrocytosis likely in the setting of chronic smoking.  No acute concerns.  Continue to follow CBC outpatient.  Hemoglobin elevated at 17 on arrival.   Patient is a daily smoker, and likely contributing. -Monitor CBC   #Diabetes mellitus Patient has had longstanding history of prediabetes.  A1c during hospitalization 6.8.  Patient was on sign scale insulin during hospitalization.  Continue workup in the outpatient setting.     #History of Subarachnoid hemorrhage  Patient had past medical history of subarachnoid hemorrhage in 2013.  No acute concerns during hospitalization.  Patient is started on anticoagulation.  Continue monitor for any neurosymptoms.

## 2022-12-09 NOTE — Telephone Encounter (Signed)
  Chief Complaint: SOB- when sitting Symptoms: fatigue, cough, SOB- chest pain- feels like not getting enough air in Frequency: ongoing getting worse Pertinent Negatives: Patient denies fever, edema Disposition: [x] ED /[] Urgent Care (no appt availability in office) / [] Appointment(In office/virtual)/ []  [facility]/ [] Home Care/ [] Refused Recommended Disposition /[] [facility]/ []  Follow-up with PCP Additional Notes: Patient/wife advised due to  seriousness of symptoms- ED. They will go.

## 2022-12-09 NOTE — ED Triage Notes (Signed)
Pt reports shortness of breath x 3 associated with a productive cough x 3 months He states he came to the ER today because he can't take it anymore and feels tired. He denies chest pain, denies any pain.

## 2022-12-09 NOTE — ED Notes (Signed)
Pt report received from previous nurse. Pt A&O x4, vitals stable, denies needs/complaints. Call bell in reach. No acute distress noted.  

## 2022-12-10 ENCOUNTER — Observation Stay (HOSPITAL_COMMUNITY): Payer: 59

## 2022-12-10 ENCOUNTER — Other Ambulatory Visit: Payer: Self-pay

## 2022-12-10 DIAGNOSIS — J9601 Acute respiratory failure with hypoxia: Secondary | ICD-10-CM | POA: Diagnosis present

## 2022-12-10 DIAGNOSIS — D751 Secondary polycythemia: Secondary | ICD-10-CM | POA: Diagnosis present

## 2022-12-10 DIAGNOSIS — I2602 Saddle embolus of pulmonary artery with acute cor pulmonale: Secondary | ICD-10-CM | POA: Diagnosis not present

## 2022-12-10 DIAGNOSIS — I50812 Chronic right heart failure: Secondary | ICD-10-CM | POA: Diagnosis not present

## 2022-12-10 DIAGNOSIS — I351 Nonrheumatic aortic (valve) insufficiency: Secondary | ICD-10-CM | POA: Diagnosis present

## 2022-12-10 DIAGNOSIS — N3289 Other specified disorders of bladder: Secondary | ICD-10-CM | POA: Diagnosis not present

## 2022-12-10 DIAGNOSIS — J441 Chronic obstructive pulmonary disease with (acute) exacerbation: Secondary | ICD-10-CM | POA: Diagnosis present

## 2022-12-10 DIAGNOSIS — K529 Noninfective gastroenteritis and colitis, unspecified: Secondary | ICD-10-CM | POA: Diagnosis present

## 2022-12-10 DIAGNOSIS — R0602 Shortness of breath: Secondary | ICD-10-CM | POA: Diagnosis not present

## 2022-12-10 DIAGNOSIS — R634 Abnormal weight loss: Secondary | ICD-10-CM | POA: Insufficient documentation

## 2022-12-10 DIAGNOSIS — Z9181 History of falling: Secondary | ICD-10-CM | POA: Diagnosis not present

## 2022-12-10 DIAGNOSIS — Z6821 Body mass index (BMI) 21.0-21.9, adult: Secondary | ICD-10-CM | POA: Diagnosis not present

## 2022-12-10 DIAGNOSIS — Z833 Family history of diabetes mellitus: Secondary | ICD-10-CM | POA: Diagnosis not present

## 2022-12-10 DIAGNOSIS — B348 Other viral infections of unspecified site: Secondary | ICD-10-CM

## 2022-12-10 DIAGNOSIS — Z7901 Long term (current) use of anticoagulants: Secondary | ICD-10-CM | POA: Diagnosis not present

## 2022-12-10 DIAGNOSIS — J90 Pleural effusion, not elsewhere classified: Secondary | ICD-10-CM | POA: Diagnosis not present

## 2022-12-10 DIAGNOSIS — I2609 Other pulmonary embolism with acute cor pulmonale: Secondary | ICD-10-CM | POA: Diagnosis not present

## 2022-12-10 DIAGNOSIS — I50813 Acute on chronic right heart failure: Secondary | ICD-10-CM | POA: Diagnosis not present

## 2022-12-10 DIAGNOSIS — I2699 Other pulmonary embolism without acute cor pulmonale: Secondary | ICD-10-CM | POA: Diagnosis not present

## 2022-12-10 DIAGNOSIS — Z79899 Other long term (current) drug therapy: Secondary | ICD-10-CM | POA: Diagnosis not present

## 2022-12-10 DIAGNOSIS — E1165 Type 2 diabetes mellitus with hyperglycemia: Secondary | ICD-10-CM | POA: Diagnosis not present

## 2022-12-10 DIAGNOSIS — Z1152 Encounter for screening for COVID-19: Secondary | ICD-10-CM | POA: Diagnosis not present

## 2022-12-10 DIAGNOSIS — I509 Heart failure, unspecified: Secondary | ICD-10-CM | POA: Diagnosis not present

## 2022-12-10 DIAGNOSIS — F17210 Nicotine dependence, cigarettes, uncomplicated: Secondary | ICD-10-CM

## 2022-12-10 DIAGNOSIS — J432 Centrilobular emphysema: Secondary | ICD-10-CM | POA: Diagnosis present

## 2022-12-10 DIAGNOSIS — Z8249 Family history of ischemic heart disease and other diseases of the circulatory system: Secondary | ICD-10-CM | POA: Diagnosis not present

## 2022-12-10 DIAGNOSIS — I5023 Acute on chronic systolic (congestive) heart failure: Secondary | ICD-10-CM | POA: Diagnosis not present

## 2022-12-10 DIAGNOSIS — E874 Mixed disorder of acid-base balance: Secondary | ICD-10-CM | POA: Diagnosis present

## 2022-12-10 DIAGNOSIS — M7989 Other specified soft tissue disorders: Secondary | ICD-10-CM | POA: Diagnosis not present

## 2022-12-10 DIAGNOSIS — Z8679 Personal history of other diseases of the circulatory system: Secondary | ICD-10-CM | POA: Diagnosis not present

## 2022-12-10 LAB — CBC
HCT: 48.8 % (ref 39.0–52.0)
Hemoglobin: 16.1 g/dL (ref 13.0–17.0)
MCH: 31.6 pg (ref 26.0–34.0)
MCHC: 33 g/dL (ref 30.0–36.0)
MCV: 95.7 fL (ref 80.0–100.0)
Platelets: 244 10*3/uL (ref 150–400)
RBC: 5.1 MIL/uL (ref 4.22–5.81)
RDW: 14.6 % (ref 11.5–15.5)
WBC: 5.6 10*3/uL (ref 4.0–10.5)
nRBC: 0 % (ref 0.0–0.2)

## 2022-12-10 LAB — CBG MONITORING, ED: Glucose-Capillary: 146 mg/dL — ABNORMAL HIGH (ref 70–99)

## 2022-12-10 LAB — ECHOCARDIOGRAM COMPLETE
Area-P 1/2: 3.63 cm2
Height: 67 in
S' Lateral: 2.4 cm
Weight: 2400 oz

## 2022-12-10 LAB — HEPATIC FUNCTION PANEL
ALT: 55 U/L — ABNORMAL HIGH (ref 0–44)
AST: 54 U/L — ABNORMAL HIGH (ref 15–41)
Albumin: 2.5 g/dL — ABNORMAL LOW (ref 3.5–5.0)
Alkaline Phosphatase: 110 U/L (ref 38–126)
Bilirubin, Direct: 0.3 mg/dL — ABNORMAL HIGH (ref 0.0–0.2)
Indirect Bilirubin: 0.7 mg/dL (ref 0.3–0.9)
Total Bilirubin: 1 mg/dL (ref 0.3–1.2)
Total Protein: 5.6 g/dL — ABNORMAL LOW (ref 6.5–8.1)

## 2022-12-10 LAB — BASIC METABOLIC PANEL
Anion gap: 8 (ref 5–15)
BUN: 25 mg/dL — ABNORMAL HIGH (ref 8–23)
CO2: 28 mmol/L (ref 22–32)
Calcium: 8.3 mg/dL — ABNORMAL LOW (ref 8.9–10.3)
Chloride: 100 mmol/L (ref 98–111)
Creatinine, Ser: 0.93 mg/dL (ref 0.61–1.24)
GFR, Estimated: >60 mL/min (ref >60)
Glucose, Bld: 151 mg/dL — ABNORMAL HIGH (ref 70–99)
Potassium: 4 mmol/L (ref 3.5–5.1)
Sodium: 136 mmol/L (ref 135–145)

## 2022-12-10 LAB — OCCULT BLOOD X 1 CARD TO LAB, STOOL: Fecal Occult Bld: NEGATIVE

## 2022-12-10 LAB — HIV ANTIBODY (ROUTINE TESTING W REFLEX): HIV Screen 4th Generation wRfx: NONREACTIVE

## 2022-12-10 MED ORDER — IPRATROPIUM-ALBUTEROL 0.5-2.5 (3) MG/3ML IN SOLN
3.0000 mL | RESPIRATORY_TRACT | Status: DC | PRN
  Administered 2022-12-13: 3 mL via RESPIRATORY_TRACT
  Filled 2022-12-10: qty 3

## 2022-12-10 MED ORDER — FUROSEMIDE 10 MG/ML IJ SOLN
40.0000 mg | Freq: Once | INTRAMUSCULAR | Status: AC
  Administered 2022-12-10: 40 mg via INTRAVENOUS
  Filled 2022-12-10: qty 4

## 2022-12-10 NOTE — Plan of Care (Signed)

## 2022-12-10 NOTE — Progress Notes (Signed)
HD#0 Subjective:   Summary: Erik Yatsko. is a 67 y.o. male with a history of subarachnoid hemorrhage presenting to the hospital with concerns of shortness of breath, diarrhea, and fatigue.  Patient admitted for further evaluation and management of acute hypoxic respiratory failure.  Overnight Events: No overnight events  Patient examined this morning.  Patient states he has a few watery stools overnight.  He states he is feeling better overall.  He states that when he was walking with oxygen, he felt fine.  He does report that he had a weight loss of 20 pounds.  He never had a colonoscopy and never had a PCP.  He currently denies any chest pain or chest pressure.  He states he used to be active, but since this started he has since been less active.  Objective:  Vital signs in last 24 hours: Vitals:   12/10/22 0900 12/10/22 0930 12/10/22 1000 12/10/22 1030  BP: 102/62 98/74 100/73 100/75  Pulse: 81 89 80 79  Resp: 17 (!) '23 17 17  '$ Temp:      TempSrc:      SpO2: 97% 96% 96% 93%  Weight:      Height:       Supplemental O2: Nasal Cannula SpO2: 93 % O2 Flow Rate (L/min): 3 L/min   Physical Exam:  Constitutional: Resting in bed, no acute distress HENT: normocephalic atraumatic Neck: No JVD Eyes: conjunctiva non-erythematous Cardiovascular: regular rate and rhythm, no m/r/g Pulmonary/Chest: Normal work of breathing on 3 L nasal cannula, decreased lung sounds throughout.  No wheezing auscultated Extremities: 1 + pitting edema noted bilaterally  Filed Weights   12/09/22 1501  Weight: 68 kg    No intake or output data in the 24 hours ending 12/10/22 1100 Net IO Since Admission: No IO data has been entered for this period [12/10/22 1100]  Pertinent Labs:    Latest Ref Rng & Units 12/10/2022    4:45 AM 12/09/2022    3:03 PM 09/02/2018   10:41 AM  CBC  WBC 4.0 - 10.5 K/uL 5.6  5.4  4.0   Hemoglobin 13.0 - 17.0 g/dL 16.1  17.2  15.5   Hematocrit 39.0 - 52.0 % 48.8   52.5  45.6   Platelets 150 - 400 K/uL 244  251  269        Latest Ref Rng & Units 12/10/2022    4:45 AM 12/09/2022    3:03 PM 12/01/2019    4:14 PM  CMP  Glucose 70 - 99 mg/dL 151  99  85   BUN 8 - 23 mg/dL '25  26  12   '$ Creatinine 0.61 - 1.24 mg/dL 0.93  1.04  1.22   Sodium 135 - 145 mmol/L 136  136  139   Potassium 3.5 - 5.1 mmol/L 4.0  4.2  4.5   Chloride 98 - 111 mmol/L 100  101  98   CO2 22 - 32 mmol/L '28  24  24   '$ Calcium 8.9 - 10.3 mg/dL 8.3  8.7  9.4     Imaging: DG Chest Port 1 View  Result Date: 12/09/2022 CLINICAL DATA:  Shortness of breath EXAM: PORTABLE CHEST 1 VIEW COMPARISON:  None FINDINGS: No pleural effusion. No pneumothorax. No focal airspace opacity. Normal cardiac and mediastinal contours. No radiographically apparent displaced rib fractures. Visualized upper abdomen is unremarkable. IMPRESSION: No acute cardiopulmonary disease. Electronically Signed   By: Marin Roberts M.D.   On: 12/09/2022 16:21  Assessment/Plan:   Principal Problem:   Acute respiratory failure with hypoxia (HCC) Active Problems:   Tobacco use disorder   Diarrhea   Unintentional weight loss  Patient Summary: Erik Botha. is a 67 y.o. male with a history of subarachnoid hemorrhage presenting to the hospital with concerns of shortness of breath, diarrhea, and fatigue.  Patient admitted for further evaluation and management of acute hypoxic respiratory failure.   #Acute hypoxic respiratory failure likely secondary to COPD exacerbation versus acute heart failure exacerbation #Parainfluenza infection Patient evaluated this morning.  Patient states his respiratory status is better.  Patient states with ambulation, he had oxygen, and was not short of breath.  He states he had good urinary output.  Denies any fevers or chills.  On exam, I do not appreciate any wheezing.  Patient still has decreased lung sounds bilaterally.  Patient also has 1+ pitting edema noted.  Patient remains afebrile.   Respiratory panel positive for parainfluenza virus.  Given history of CT showing centrilobular emphysema, hyperinflation seen on chest x-ray and smoking history patient likely has COPD, but will need PFTs to diagnose. Still would like to rule out heart failure at this time given elevated BNP. Will continue to workup. -Day 2 of 5 of prednisone '40mg'$  -DuoNebs every 4 hours -Echo pending -Daily weights -Strict I's and O's -PFTs in outpatient setting -Monitor respiratory status -Wean off oxygen as tolerated to maintain SpO2 greater than 92%   #Chronic diarrhea Patient states he had multiple watery stools last night.  Workup so far has not been revealing.  Have been unable to obtain stool sample at this time. CRP elevated at 10.  ESR negative.  Thyroid etiology negative.  Awaiting other workup.  Differential includes IBD, infection, celiac disease, microscopic colitis, pancreatic insufficiency.  If workup does continue to be negative for infection, patient could benefit from outpatient colonoscopy.  Also given chronic weight loss of 20 pounds and family history of colon cancer in father and sister, patient can benefit from colonoscopy. -Follow-up lab workup, and stool studies  #Troponeimia, resolving Troponin trending down.  Likely elevated in the setting of hypoxia.  No acute concerns for ACS. -Continue monitor  #Erythrocytosis, resolved Hemoglobin within normal limits today.  No concerns.  Erythrocytosis likely secondary to patient being a daily smoker. -Monitor CBC   #Prediabetes Patient with past medical history of prediabetes.  A1c still pending.  Fasting glucose today 151. -A1c pending    #History of Subarachnoid hemorrhage  Patient does have a history of subarachnoid hemorrhage in 2013.  No acute concerns. -Continue to monitor   Diet: Heart Healthy VTE: Enoxaparin IVF: None,None Code: Full  Dispo: Anticipated discharge to Home in 4 days pending clinical improvement.   [place] [facility] Resident PGY-1 [phone] Please contact the on call pager after 5 pm and on weekends at [phone].

## 2022-12-10 NOTE — ED Notes (Signed)
ED TO INPATIENT HANDOFF REPORT  ED Nurse Name and Phone #: [phone]  S Name/Age/Gender Erik Ward. 67 y.o. male Room/Bed: 038C/038C  Code Status   Code Status: Full Code  Home/SNF/Other Home Patient oriented to: self, place, time, and situation Is this baseline? Yes   Triage Complete: Triage complete  Chief Complaint Acute respiratory failure with hypoxia ([place]) [J96.01]  Triage Note Pt reports shortness of breath x 3 associated with a productive cough x 3 months He states he came to the ER today because he can't take it anymore and feels tired. He denies chest pain, denies any pain.   Allergies Allergies  Allergen Reactions   Tdap [Tetanus-Diphth-Acell Pertussis]     Patient reports breaking out in "lesions"    Level of Care/Admitting Diagnosis ED Disposition     ED Disposition  Admit   Condition  --   [place]: [place] [[id number]]  Level of Care: Telemetry Medical [104]  [facility] patient in [facility] or [place] if equivalent level of care is available:: No  Covid Evaluation: Asymptomatic - no recent exposure (last 10 days) testing not required  Diagnosis: Acute respiratory failure with hypoxia [facility]) [id number]  Admitting Physician: Velna Ochs [id number]  Attending Physician: Velna Ochs [id number]          B Medical/Surgery History Past Medical History:  Diagnosis Date   Subarachnoid hemorrhage following injury, with loss of consciousness ([place]) 09/18/2012   syncope and collapse (09/18/2012)   Syncope and collapse 09/18/2012   w/loss of consciousness (09/18/2012)   Past Surgical History:  Procedure Laterality Date   PERCUTANEOUS PINNING PHALANX FRACTURE OF HAND  1980's   "right" (09/18/2012)     A IV Location/Drains/Wounds Patient Lines/Drains/Airways Status     Active Line/Drains/Airways     Name Placement date Placement time Site Days   Peripheral IV 12/09/22 18 G  Anterior;Right Forearm 12/09/22  1557  Forearm  1            Intake/Output Last 24 hours No intake or output data in the 24 hours ending 12/10/22 1153  Labs/Imaging Results for orders placed or performed during the hospital encounter of 12/09/22 (from the past 48 hour(s))  Basic metabolic panel     Status: Abnormal   Collection Time: 12/09/22  3:03 PM  Result Value Ref Range   Sodium 136 135 - 145 mmol/L   Potassium 4.2 3.5 - 5.1 mmol/L   Chloride 101 98 - 111 mmol/L   CO2 24 22 - 32 mmol/L   Glucose, Bld 99 70 - 99 mg/dL    Comment: Glucose reference range applies only to samples taken after fasting for at least 8 hours.   BUN 26 (H) 8 - 23 mg/dL   Creatinine, Ser 1.04 0.61 - 1.24 mg/dL   Calcium 8.7 (L) 8.9 - 10.3 mg/dL   GFR, Estimated >60 >60 mL/min    Comment: (NOTE) Calculated using the CKD-EPI Creatinine Equation (2021)    Anion gap 11 5 - 15    Comment: Performed at [facility] [address], [place], [place] [address]  CBC with Differential     Status: Abnormal   Collection Time: 12/09/22  3:03 PM  Result Value Ref Range   WBC 5.4 4.0 - 10.5 K/uL   RBC 5.45 4.22 - 5.81 MIL/uL   Hemoglobin 17.2 (H) 13.0 - 17.0 g/dL   HCT 52.5 (H) 39.0 - 52.0 %   MCV  96.3 80.0 - 100.0 fL   MCH 31.6 26.0 - 34.0 pg   MCHC 32.8 30.0 - 36.0 g/dL   RDW 14.9 11.5 - 15.5 %   Platelets 251 150 - 400 K/uL   nRBC 0.0 0.0 - 0.2 %   Neutrophils Relative % 73 %   Neutro Abs 4.0 1.7 - 7.7 K/uL   Lymphocytes Relative 16 %   Lymphs Abs 0.9 0.7 - 4.0 K/uL   Monocytes Relative 10 %   Monocytes Absolute 0.6 0.1 - 1.0 K/uL   Eosinophils Relative 0 %   Eosinophils Absolute 0.0 0.0 - 0.5 K/uL   Basophils Relative 0 %   Basophils Absolute 0.0 0.0 - 0.1 K/uL   Immature Granulocytes 1 %   Abs Immature Granulocytes 0.03 0.00 - 0.07 K/uL    Comment: Performed at [facility] [address] [place], [place] [address]  Brain natriuretic peptide     Status: Abnormal   Collection  Time: 12/09/22  3:03 PM  Result Value Ref Range   B Natriuretic Peptide 1,787.5 (H) 0.0 - 100.0 pg/mL    Comment: Performed at [facility] [address], [place], [place] [address]  Troponin I (High Sensitivity)     Status: Abnormal   Collection Time: 12/09/22  3:03 PM  Result Value Ref Range   Troponin I (High Sensitivity) 57 (H) <18 ng/L    Comment: (NOTE) Elevated high sensitivity troponin I (hsTnI) values and significant  changes across serial measurements may suggest ACS but many other  chronic and acute conditions are known to elevate hsTnI results.  Refer to the "Links" section for chest pain algorithms and additional  guidance. Performed at [facility], [place] [address], [place], [place] [address]   TSH     Status: None   Collection Time: 12/09/22  5:51 PM  Result Value Ref Range   TSH 0.919 0.350 - 4.500 uIU/mL    Comment: Performed by a 3rd Generation assay with a functional sensitivity of <=0.01 uIU/mL. Performed at [facility], [place] [address] [place] [address], [place], [place] [address]   Resp panel by RT-PCR (RSV, Flu A&B, Covid) Anterior Nasal Swab     Status: None   Collection Time: 12/09/22  6:20 PM   Specimen: Anterior Nasal Swab  Result Value Ref Range   SARS Coronavirus 2 by RT PCR NEGATIVE NEGATIVE   Influenza A by PCR NEGATIVE NEGATIVE   Influenza B by PCR NEGATIVE NEGATIVE    Comment: (NOTE) The Xpert Xpress SARS-CoV-2/FLU/RSV plus assay is intended as an aid in the diagnosis of influenza from Nasopharyngeal swab specimens and should not be used as a sole basis for treatment. Nasal washings and aspirates are unacceptable for Xpert Xpress SARS-CoV-2/FLU/RSV testing.  Fact Sheet for Patients: EntrepreneurPulse.com.au  Fact Sheet for Healthcare Providers: IncredibleEmployment.be  This test is not yet approved or cleared by the [place] FDA and has been authorized for detection and/or diagnosis of SARS-CoV-2  by FDA under an Emergency Use Authorization (EUA). This EUA will remain in effect (meaning this test can be used) for the duration of the COVID-19 declaration under Section 564(b)(1) of the Act, 21 U.S.C. section 360bbb-3(b)(1), unless the authorization is terminated or revoked.     Resp Syncytial Virus by PCR NEGATIVE NEGATIVE    Comment: (NOTE) Fact Sheet for Patients: EntrepreneurPulse.com.au  Fact Sheet for Healthcare Providers: IncredibleEmployment.be  This test is not yet approved or cleared by the [place] FDA and has  been authorized for detection and/or diagnosis of SARS-CoV-2 by FDA under an Emergency Use Authorization (EUA). This EUA will remain in effect (meaning this test can be used) for the duration of the COVID-19 declaration under Section 564(b)(1) of the Act, 21 U.S.C. section 360bbb-3(b)(1), unless the authorization is terminated or revoked.  Performed at [facility], [place] [address] [place], [place] [address]   Respiratory (~20 pathogens) panel by PCR     Status: Abnormal   Collection Time: 12/09/22  6:20 PM   Specimen: Anterior Nasal Swab; Respiratory  Result Value Ref Range   Adenovirus NOT DETECTED NOT DETECTED   Coronavirus 229E NOT DETECTED NOT DETECTED    Comment: (NOTE) The Coronavirus on the Respiratory Panel, DOES NOT test for the novel  Coronavirus (2019 nCoV)    Coronavirus HKU1 NOT DETECTED NOT DETECTED   Coronavirus NL63 NOT DETECTED NOT DETECTED   Coronavirus OC43 NOT DETECTED NOT DETECTED   Metapneumovirus NOT DETECTED NOT DETECTED   Rhinovirus / Enterovirus NOT DETECTED NOT DETECTED   Influenza A NOT DETECTED NOT DETECTED   Influenza B NOT DETECTED NOT DETECTED   Parainfluenza Virus 1 NOT DETECTED NOT DETECTED   Parainfluenza Virus 2 NOT DETECTED NOT DETECTED   Parainfluenza Virus 3 DETECTED (A) NOT DETECTED   Parainfluenza Virus 4 NOT DETECTED NOT DETECTED   Respiratory Syncytial Virus  NOT DETECTED NOT DETECTED   Bordetella pertussis NOT DETECTED NOT DETECTED   Bordetella Parapertussis NOT DETECTED NOT DETECTED   Chlamydophila pneumoniae NOT DETECTED NOT DETECTED   Mycoplasma pneumoniae NOT DETECTED NOT DETECTED    Comment: Performed at [facility], [place] [address] [place], [place] [address]  Troponin I (High Sensitivity)     Status: Abnormal   Collection Time: 12/09/22  8:08 PM  Result Value Ref Range   Troponin I (High Sensitivity) 46 (H) <18 ng/L    Comment: (NOTE) Elevated high sensitivity troponin I (hsTnI) values and significant  changes across serial measurements may suggest ACS but many other  chronic and acute conditions are known to elevate hsTnI results.  Refer to the "Links" section for chest pain algorithms and additional  guidance. Performed at [facility], [place] [address], [place], [place] [address]   Sedimentation rate     Status: None   Collection Time: 12/09/22  8:08 PM  Result Value Ref Range   Sed Rate 1 0 - 16 mm/hr    Comment: Performed at [facility] [address] [place], [place] [address]  C-reactive protein     Status: Abnormal   Collection Time: 12/09/22  8:08 PM  Result Value Ref Range   CRP 10.2 (H) <1.0 mg/dL    Comment: Performed at [facility], [place] [address], [place],  [id number]  Basic metabolic panel     Status: Abnormal   Collection Time: 12/10/22  4:45 AM  Result Value Ref Range   Sodium 136 135 - 145 mmol/L   Potassium 4.0 3.5 - 5.1 mmol/L   Chloride 100 98 - 111 mmol/L   CO2 28 22 - 32 mmol/L   Glucose, Bld 151 (H) 70 - 99 mg/dL    Comment: Glucose reference range applies only to samples taken after fasting for at least 8 hours.   BUN 25 (H) 8 - 23 mg/dL   Creatinine, Ser 0.93 0.61 - 1.24 mg/dL   Calcium 8.3 (L) 8.9 - 10.3 mg/dL   GFR, Estimated >60 >60 mL/min  Comment: (NOTE) Calculated using the CKD-EPI Creatinine Equation (2021)    Anion gap 8 5 - 15    Comment: Performed  at [facility], [place] [address], [place] [address]  CBC     Status: None   Collection Time: 12/10/22  4:45 AM  Result Value Ref Range   WBC 5.6 4.0 - 10.5 K/uL   RBC 5.10 4.22 - 5.81 MIL/uL   Hemoglobin 16.1 13.0 - 17.0 g/dL   HCT 48.8 39.0 - 52.0 %   MCV 95.7 80.0 - 100.0 fL   MCH 31.6 26.0 - 34.0 pg   MCHC 33.0 30.0 - 36.0 g/dL   RDW 14.6 11.5 - 15.5 %   Platelets 244 150 - 400 K/uL   nRBC 0.0 0.0 - 0.2 %    Comment: Performed at [facility], [place] [address], [place], [place] [address]  HIV Antibody (routine testing w rflx)     Status: None   Collection Time: 12/10/22  4:45 AM  Result Value Ref Range   HIV Screen 4th Generation wRfx Non Reactive Non Reactive    Comment: Performed at [facility], [place] [address], [place], [place] [address]  Hepatic function panel     Status: Abnormal   Collection Time: 12/10/22  4:45 AM  Result Value Ref Range   Total Protein 5.6 (L) 6.5 - 8.1 g/dL   Albumin 2.5 (L) 3.5 - 5.0 g/dL   AST 54 (H) 15 - 41 U/L   ALT 55 (H) 0 - 44 U/L   Alkaline Phosphatase 110 38 - 126 U/L   Total Bilirubin 1.0 0.3 - 1.2 mg/dL   Bilirubin, Direct 0.3 (H) 0.0 - 0.2 mg/dL   Indirect Bilirubin 0.7 0.3 - 0.9 mg/dL    Comment: Performed at [facility] [address] [place], [place] [address]  CBG monitoring, ED     Status: Abnormal   Collection Time: 12/10/22  8:24 AM  Result Value Ref Range   Glucose-Capillary 146 (H) 70 - 99 mg/dL    Comment: Glucose reference range applies only to samples taken after fasting for at least 8 hours.   Comment 1 Notify RN    Comment 2 Document in Chart    DG Chest Port 1 View  Result Date: 12/09/2022 CLINICAL DATA:  Shortness of breath EXAM: PORTABLE CHEST 1 VIEW COMPARISON:  None FINDINGS: No pleural effusion. No pneumothorax. No focal airspace opacity. Normal cardiac and mediastinal contours. No radiographically apparent displaced rib fractures. Visualized upper abdomen is unremarkable.  IMPRESSION: No acute cardiopulmonary disease. Electronically Signed   By: Marin Roberts M.D.   On: 12/09/2022 16:21    Pending Labs Unresulted Labs (From admission, onward)     Start     Ordered   12/11/22 0500  CBC  Tomorrow morning,   R        12/10/22 0822   12/11/22 [id number]  Basic metabolic panel  Tomorrow morning,   R        12/10/22 0822   12/09/22 1830  Glia (IgA/G) + tTG IgA  Once,   R        12/09/22 1829   12/09/22 1825  Hemoglobin A1c  Once,   R        12/09/22 1824   12/09/22 1824  Calprotectin, Fecal  Once,   R        12/09/22 1824   12/09/22 1822  Pancreatic elastase, fecal  Once,  R        12/09/22 1824   12/09/22 1819  Gastrointestinal Panel by PCR , Stool  (Gastrointestinal Panel by PCR, Stool                                                                                                                                                     **Does Not include CLOSTRIDIUM DIFFICILE testing. **If CDIFF testing is needed, place order from the "C Difficile Testing" order set.**)  Once,   R        12/09/22 1824   Unscheduled  Occult blood card to lab, stool  As needed,   R      12/09/22 1829            Vitals/Pain Today's Vitals   12/10/22 0930 12/10/22 1000 12/10/22 1030 12/10/22 1130  BP: 98/74 100/73 100/75 105/67  Pulse: 89 80 79 78  Resp: (!) '23 17 17 12  '$ Temp:      TempSrc:      SpO2: 96% 96% 93% 99%  Weight:      Height:      PainSc:        Isolation Precautions Droplet and Contact precautions  Medications Medications  enoxaparin (LOVENOX) injection 40 mg (40 mg Subcutaneous Given 12/09/22 2054)  acetaminophen (TYLENOL) tablet 650 mg (has no administration in time range)    Or  acetaminophen (TYLENOL) suppository 650 mg (has no administration in time range)  predniSONE (DELTASONE) tablet 40 mg (40 mg Oral Given 12/09/22 2054)  ipratropium-albuterol (DUONEB) 0.5-2.5 (3) MG/3ML nebulizer solution 3 mL (3 mLs Nebulization Given 12/09/22 1507)  furosemide  (LASIX) injection 40 mg (40 mg Intravenous Given 12/09/22 1724)  ipratropium-albuterol (DUONEB) 0.5-2.5 (3) MG/3ML nebulizer solution 3 mL (3 mLs Nebulization Given 12/10/22 1128)  furosemide (LASIX) injection 40 mg (40 mg Intravenous Given 12/10/22 1126)    Mobility walks     Focused Assessments Pulmonary Assessment Handoff:  Lung sounds: Bilateral Breath Sounds: Diminished L Breath Sounds: Diminished R Breath Sounds: Diminished O2 Device: Room Air O2 Flow Rate (L/min): 3 L/min    R Recommendations: See Admitting Provider Note  Report given to:   Additional Notes: presented with c/o shortness of breath. Evaluating for heart failure, ECHO results are pending. Patient is on 3L [place] at this time. He does not use supplemental O2 at baseline.

## 2022-12-10 NOTE — Consult Note (Signed)
CONSULTATION NOTE   Patient Name: Erik Ward. Date of Encounter: 12/10/2022 Cardiologist: Pixie Casino, MD Electrophysiologist: None Advanced Heart Failure: None   Chief Complaint   Shortness of breath  Patient Profile   67 yo male with shortness of breath, found to have probable acute right heart failure  HPI   Erik Rolan Noteboom. is a 67 y.o. male who is being seen today for the evaluation of shortness of breath/CHF at the request of Dr. Philipp Ovens. This is a pleasant 67 year old male with no known cardiac history but prior subarachnoid hemorrhage following injury which was a syncopal episode.  There is family history of hypertension and colon cancer.  He does have a 20-pack-year smoking history.  He presents with several months of progressive shortness of breath, orthopnea and unintentional weight loss.  He is not currently on any medications.  On arrival in the emergency department he was hypoxemic with O2 sats around 70% and was found to have an elevated BNP at 1787.  Troponins were mild flat elevated.  EKG showed sinus rhythm with a right bundle branch block and inferior Q waves with T wave inversions in the septal leads.  Chest x-ray showed no acute disease.  A respiratory viral panel was positive for parainfluenza.  He did have a prior echo in 2013 which showed normal EF of 55 to 60% with mild to moderate AI and mild dilatation of the LA, RV and RA.  Repeat echo today shows normal LVEF with severe RV dilatation with low normal RV function.  His IVC was dilated and did not collapse although his RVSP was not measured to be severely elevated.  He has been treated for acute right heart failure and was initially given IV Lasix.  He is recorded net -1.5 L.  PMHx   Past Medical History:  Diagnosis Date   Subarachnoid hemorrhage following injury, with loss of consciousness ([place]) 09/18/2012   syncope and collapse (09/18/2012)   Syncope and collapse 09/18/2012   w/loss of  consciousness (09/18/2012)    Past Surgical History:  Procedure Laterality Date   PERCUTANEOUS PINNING PHALANX FRACTURE OF HAND  1980's   "right" (09/18/2012)    FAMHx   Family History  Problem Relation Age of Onset   Hypertension Mother    Colon cancer Father     SOCHx    reports that he has been smoking cigarettes. He has a 20.00 pack-year smoking history. He has never used smokeless tobacco. He reports that he does not drink alcohol and does not use drugs.  Outpatient Medications   No current facility-administered medications on file prior to encounter.   No current outpatient medications on file prior to encounter.    Inpatient Medications    Scheduled Meds:  enoxaparin (LOVENOX) injection  40 mg Subcutaneous Q24H   predniSONE  40 mg Oral Q2000    Continuous Infusions:   PRN Meds: acetaminophen **OR** acetaminophen, ipratropium-albuterol   ALLERGIES   Allergies  Allergen Reactions   Tdap [Tetanus-Diphth-Acell Pertussis]     Patient reports breaking out in "lesions"    ROS   Pertinent items noted in HPI and remainder of comprehensive ROS otherwise negative.  Vitals   Vitals:   12/10/22 1341 12/10/22 1400 12/10/22 1500 12/10/22 1530  BP: 104/71 115/73  100/68  Pulse: 83 90  80  Resp: 16   18  Temp: 97.8 F (36.6 C)  97.6 F (36.4 C) 97.6 F (36.4 C)  TempSrc: Oral   Oral  SpO2: 93% 97%  96%  Weight: 60 kg     Height: '5\' 7"'$  (1.702 m)       Intake/Output Summary (Last 24 hours) at 12/10/2022 1654 Last data filed at 12/10/2022 1500 Gross per 24 hour  Intake --  Output 1500 ml  Net -1500 ml   Filed Weights   12/09/22 1501 12/10/22 1341  Weight: 68 kg 60 kg    Physical Exam   General appearance: alert and no distress Neck: JVD - 5 cm above sternal notch, no carotid bruit, no JVD, and thyroid not enlarged, symmetric, no tenderness/mass/nodules Lungs: diminished breath sounds bibasilar Heart: regular rate and rhythm Abdomen: soft,  non-tender; bowel sounds normal; no masses,  no organomegaly and scaphoid Extremities: edema trivial ankle Pulses: 2+ and symmetric Skin: Skin color, texture, turgor normal. No rashes or lesions Neurologic: Grossly normal Psych: Pleasant  Labs   Results for orders placed or performed during the hospital encounter of 12/09/22 (from the past 48 hour(s))  Basic metabolic panel     Status: Abnormal   Collection Time: 12/09/22  3:03 PM  Result Value Ref Range   Sodium 136 135 - 145 mmol/L   Potassium 4.2 3.5 - 5.1 mmol/L   Chloride 101 98 - 111 mmol/L   CO2 24 22 - 32 mmol/L   Glucose, Bld 99 70 - 99 mg/dL    Comment: Glucose reference range applies only to samples taken after fasting for at least 8 hours.   BUN 26 (H) 8 - 23 mg/dL   Creatinine, Ser 1.04 0.61 - 1.24 mg/dL   Calcium 8.7 (L) 8.9 - 10.3 mg/dL   GFR, Estimated >60 >60 mL/min    Comment: (NOTE) Calculated using the CKD-EPI Creatinine Equation (2021)    Anion gap 11 5 - 15    Comment: Performed at [facility] [address] [place], [place] [address]  CBC with Differential     Status: Abnormal   Collection Time: 12/09/22  3:03 PM  Result Value Ref Range   WBC 5.4 4.0 - 10.5 K/uL   RBC 5.45 4.22 - 5.81 MIL/uL   Hemoglobin 17.2 (H) 13.0 - 17.0 g/dL   HCT 52.5 (H) 39.0 - 52.0 %   MCV 96.3 80.0 - 100.0 fL   MCH 31.6 26.0 - 34.0 pg   MCHC 32.8 30.0 - 36.0 g/dL   RDW 14.9 11.5 - 15.5 %   Platelets 251 150 - 400 K/uL   nRBC 0.0 0.0 - 0.2 %   Neutrophils Relative % 73 %   Neutro Abs 4.0 1.7 - 7.7 K/uL   Lymphocytes Relative 16 %   Lymphs Abs 0.9 0.7 - 4.0 K/uL   Monocytes Relative 10 %   Monocytes Absolute 0.6 0.1 - 1.0 K/uL   Eosinophils Relative 0 %   Eosinophils Absolute 0.0 0.0 - 0.5 K/uL   Basophils Relative 0 %   Basophils Absolute 0.0 0.0 - 0.1 K/uL   Immature Granulocytes 1 %   Abs Immature Granulocytes 0.03 0.00 - 0.07 K/uL    Comment: Performed at [facility] [address],  [place], [place] [address]  Brain natriuretic peptide     Status: Abnormal   Collection Time: 12/09/22  3:03 PM  Result Value Ref Range   B Natriuretic Peptide 1,787.5 (H) 0.0 - 100.0 pg/mL    Comment: Performed at [facility] [address] [place], [place] [address]  Troponin I (High Sensitivity)  Status: Abnormal   Collection Time: 12/09/22  3:03 PM  Result Value Ref Range   Troponin I (High Sensitivity) 57 (H) <18 ng/L    Comment: (NOTE) Elevated high sensitivity troponin I (hsTnI) values and significant  changes across serial measurements may suggest ACS but many other  chronic and acute conditions are known to elevate hsTnI results.  Refer to the "Links" section for chest pain algorithms and additional  guidance. Performed at [facility], [place] [address], [place], [place] [address]   TSH     Status: None   Collection Time: 12/09/22  5:51 PM  Result Value Ref Range   TSH 0.919 0.350 - 4.500 uIU/mL    Comment: Performed by a 3rd Generation assay with a functional sensitivity of <=0.01 uIU/mL. Performed at [facility], [place] [address], [place], [place] [address]   Resp panel by RT-PCR (RSV, Flu A&B, Covid) Anterior Nasal Swab     Status: None   Collection Time: 12/09/22  6:20 PM   Specimen: Anterior Nasal Swab  Result Value Ref Range   SARS Coronavirus 2 by RT PCR NEGATIVE NEGATIVE   Influenza A by PCR NEGATIVE NEGATIVE   Influenza B by PCR NEGATIVE NEGATIVE    Comment: (NOTE) The Xpert Xpress SARS-CoV-2/FLU/RSV plus assay is intended as an aid in the diagnosis of influenza from Nasopharyngeal swab specimens and should not be used as a sole basis for treatment. Nasal washings and aspirates are unacceptable for Xpert Xpress SARS-CoV-2/FLU/RSV testing.  Fact Sheet for Patients: EntrepreneurPulse.com.au  Fact Sheet for Healthcare Providers: IncredibleEmployment.be  This test is not yet approved or cleared by the [place] FDA and has been authorized for detection and/or diagnosis of SARS-CoV-2 by FDA under an Emergency Use Authorization (EUA). This EUA will remain in effect (meaning this test can be used) for the duration of the COVID-19 declaration under Section 564(b)(1) of the Act, 21 U.S.C. section 360bbb-3(b)(1), unless the authorization is terminated or revoked.     Resp Syncytial Virus by PCR NEGATIVE NEGATIVE    Comment: (NOTE) Fact Sheet for Patients: EntrepreneurPulse.com.au  Fact Sheet for Healthcare Providers: IncredibleEmployment.be  This test is not yet approved or cleared by the [place] FDA and has been authorized for detection and/or diagnosis of SARS-CoV-2 by FDA under an Emergency Use Authorization (EUA). This EUA will remain in effect (meaning this test can be used) for the duration of the COVID-19 declaration under Section 564(b)(1) of the Act, 21 U.S.C. section 360bbb-3(b)(1), unless the authorization is terminated or revoked.  Performed at [facility], [place] [address], [place], [place] [address]   Respiratory (~20 pathogens) panel by PCR     Status: Abnormal   Collection Time: 12/09/22  6:20 PM   Specimen: Anterior Nasal Swab; Respiratory  Result Value Ref Range   Adenovirus NOT DETECTED NOT DETECTED   Coronavirus 229E NOT DETECTED NOT DETECTED    Comment: (NOTE) The Coronavirus on the Respiratory Panel, DOES NOT test for the novel  Coronavirus (2019 nCoV)    Coronavirus HKU1 NOT DETECTED NOT DETECTED   Coronavirus NL63 NOT DETECTED NOT DETECTED   Coronavirus OC43 NOT DETECTED NOT DETECTED   Metapneumovirus NOT DETECTED NOT DETECTED   Rhinovirus / Enterovirus NOT DETECTED NOT DETECTED   Influenza A NOT DETECTED NOT DETECTED   Influenza B NOT DETECTED NOT DETECTED   Parainfluenza Virus 1 NOT DETECTED NOT DETECTED   Parainfluenza Virus 2 NOT DETECTED NOT DETECTED   Parainfluenza Virus  3 DETECTED (A) NOT DETECTED    Parainfluenza Virus 4 NOT DETECTED NOT DETECTED   Respiratory Syncytial Virus NOT DETECTED NOT DETECTED   Bordetella pertussis NOT DETECTED NOT DETECTED   Bordetella Parapertussis NOT DETECTED NOT DETECTED   Chlamydophila pneumoniae NOT DETECTED NOT DETECTED   Mycoplasma pneumoniae NOT DETECTED NOT DETECTED    Comment: Performed at [facility] [address], [place], [place] [address]  Troponin I (High Sensitivity)     Status: Abnormal   Collection Time: 12/09/22  8:08 PM  Result Value Ref Range   Troponin I (High Sensitivity) 46 (H) <18 ng/L    Comment: (NOTE) Elevated high sensitivity troponin I (hsTnI) values and significant  changes across serial measurements may suggest ACS but many other  chronic and acute conditions are known to elevate hsTnI results.  Refer to the "Links" section for chest pain algorithms and additional  guidance. Performed at [facility], [place] [address] [place], [place] [address]   Sedimentation rate     Status: None   Collection Time: 12/09/22  8:08 PM  Result Value Ref Range   Sed Rate 1 0 - 16 mm/hr    Comment: Performed at [facility] [address], [place], [place] [address]  C-reactive protein     Status: Abnormal   Collection Time: 12/09/22  8:08 PM  Result Value Ref Range   CRP 10.2 (H) <1.0 mg/dL    Comment: Performed at [facility], [place] [address] [place], [place] [id number]  Basic metabolic panel     Status: Abnormal   Collection Time: 12/10/22  4:45 AM  Result Value Ref Range   Sodium 136 135 - 145 mmol/L   Potassium 4.0 3.5 - 5.1 mmol/L   Chloride 100 98 - 111 mmol/L   CO2 28 22 - 32 mmol/L   Glucose, Bld 151 (H) 70 - 99 mg/dL    Comment: Glucose reference range applies only to samples taken after fasting for at least 8 hours.   BUN 25 (H) 8 - 23 mg/dL   Creatinine, Ser 0.93 0.61 - 1.24 mg/dL   Calcium 8.3 (L) 8.9 - 10.3 mg/dL   GFR, Estimated >60 >60 mL/min    Comment: (NOTE) Calculated using the  CKD-EPI Creatinine Equation (2021)    Anion gap 8 5 - 15    Comment: Performed at [facility] [address], [place] [address]  CBC     Status: None   Collection Time: 12/10/22  4:45 AM  Result Value Ref Range   WBC 5.6 4.0 - 10.5 K/uL   RBC 5.10 4.22 - 5.81 MIL/uL   Hemoglobin 16.1 13.0 - 17.0 g/dL   HCT 48.8 39.0 - 52.0 %   MCV 95.7 80.0 - 100.0 fL   MCH 31.6 26.0 - 34.0 pg   MCHC 33.0 30.0 - 36.0 g/dL   RDW 14.6 11.5 - 15.5 %   Platelets 244 150 - 400 K/uL   nRBC 0.0 0.0 - 0.2 %    Comment: Performed at [facility], [place] [address] [place], [place] [address]  HIV Antibody (routine testing w rflx)     Status: None   Collection Time: 12/10/22  4:45 AM  Result Value Ref Range   HIV Screen 4th Generation wRfx Non Reactive Non Reactive    Comment: Performed at [facility], [place] [address] [place], [place] [address]  Hepatic function panel  Status: Abnormal   Collection Time: 12/10/22  4:45 AM  Result Value Ref Range   Total Protein 5.6 (L) 6.5 - 8.1 g/dL   Albumin 2.5 (L) 3.5 - 5.0 g/dL   AST 54 (H) 15 - 41 U/L   ALT 55 (H) 0 - 44 U/L   Alkaline Phosphatase 110 38 - 126 U/L   Total Bilirubin 1.0 0.3 - 1.2 mg/dL   Bilirubin, Direct 0.3 (H) 0.0 - 0.2 mg/dL   Indirect Bilirubin 0.7 0.3 - 0.9 mg/dL    Comment: Performed at [facility] [address], [place], [place] [address]  CBG monitoring, ED     Status: Abnormal   Collection Time: 12/10/22  8:24 AM  Result Value Ref Range   Glucose-Capillary 146 (H) 70 - 99 mg/dL    Comment: Glucose reference range applies only to samples taken after fasting for at least 8 hours.   Comment 1 Notify RN    Comment 2 Document in Chart     ECG   Sinus rhythm at 76, RBBB, anterolateral TWI's - Personally Reviewed  Telemetry   Sinus rhythm - Personally Reviewed  Radiology   ECHOCARDIOGRAM COMPLETE  Result Date: 12/10/2022    ECHOCARDIOGRAM REPORT   Patient Name:   Erik Ward. Date of  Exam: 12/10/2022 Medical Rec #:  [id number]           Height:       67.0 in Accession #:    [id number]          Weight:       150.0 lb Date of Birth:  April 17, 1956           BSA:          1.790 m Patient Age:    35 years            BP:           100/75 mmHg Patient Gender: M                   HR:           80 bpm. Exam Location:  Inpatient Procedure: 2D Echo, Cardiac Doppler and Color Doppler Indications:    respiratory distress  History:        Patient has prior history of Echocardiogram examinations. Risk                 Factors:Current Smoker.  Sonographer:    Danne Baxter RDCS, FE, PE Referring Phys: [id number] Velna Ochs  Sonographer Comments: Technically difficult study due to poor echo windows. IMPRESSIONS  1. Left ventricular ejection fraction, by estimation, is 55 to 60%. The left ventricle has normal function. The left ventricle has no regional wall motion abnormalities. Left ventricular diastolic parameters are indeterminate. There is the interventricular septum is flattened in systole, consistent with right ventricular pressure overload.  2. Right ventricular systolic function is normal. The right ventricular size is severely enlarged. There is normal pulmonary artery systolic pressure, but this may due to equalization of pressures. Study suggestive of Cor Pulmonale.  3. Left atrial size was severely dilated.  4. The mitral valve is grossly normal. No evidence of mitral valve regurgitation.  5. The aortic valve was not well visualized. Aortic valve regurgitation is not visualized.  6. The inferior vena cava is dilated in size with <50% respiratory variability, suggesting right atrial pressure of 15 mmHg. Comparison(s): No prior Echocardiogram. FINDINGS  Left Ventricle: Left ventricular ejection fraction, by  estimation, is 55 to 60%. The left ventricle has normal function. The left ventricle has no regional wall motion abnormalities. The left ventricular internal cavity size was normal in size. There is   no left ventricular hypertrophy. The interventricular septum is flattened in systole, consistent with right ventricular pressure overload. Left ventricular diastolic parameters are indeterminate. Right Ventricle: The right ventricular size is severely enlarged. No increase in right ventricular wall thickness. Right ventricular systolic function is normal. There is normal pulmonary artery systolic pressure. The tricuspid regurgitant velocity is 2.16 m/s, and with an assumed right atrial pressure of 15 mmHg, the estimated right ventricular systolic pressure is [id number] mmHg. Left Atrium: Left atrial size was severely dilated. Right Atrium: Right atrial size was normal in size. Pericardium: Trivial pericardial effusion is present. The pericardial effusion is anterior to the right ventricle. Mitral Valve: The mitral valve is grossly normal. No evidence of mitral valve regurgitation. Tricuspid Valve: The tricuspid valve is normal in structure. Tricuspid valve regurgitation is mild. Aortic Valve: The aortic valve was not well visualized. Aortic valve regurgitation is not visualized. Pulmonic Valve: The pulmonic valve was not well visualized. Pulmonic valve regurgitation is trivial. Aorta: The aortic root is normal in size and structure. Venous: The inferior vena cava is dilated in size with less than 50% respiratory variability, suggesting right atrial pressure of 15 mmHg. IAS/Shunts: No atrial level shunt detected by color flow Doppler.  LEFT VENTRICLE PLAX 2D LVIDd:         4.00 cm   Diastology LVIDs:         2.40 cm   LV e' medial:    4.51 cm/s LV PW:         1.00 cm   LV E/e' medial:  11.6 LV IVS:        0.80 cm   LV e' lateral:   7.15 cm/s LVOT diam:     1.60 cm   LV E/e' lateral: 7.3 LV SV:         25 LV SV Index:   14 LVOT Area:     2.01 cm  RIGHT VENTRICLE RV S prime:     10.60 cm/s TAPSE (M-mode): 1.8 cm LEFT ATRIUM           Index        RIGHT ATRIUM           Index LA diam:      3.30 cm 1.84 cm/m   RA Area:      31.90 cm LA Vol (A4C): 44.2 ml 24.70 ml/m  RA Volume:   141.00 ml 78.79 ml/m  AORTIC VALVE LVOT Vmax:   96.70 cm/s LVOT Vmean:  53.800 cm/s LVOT VTI:    0.124 m  AORTA Ao Root diam: 3.30 cm MITRAL VALVE               TRICUSPID VALVE MV Area (PHT): 3.63 cm    TR Peak grad:   18.7 mmHg MV Decel Time: 209 msec    TR Vmax:        216.00 cm/s MV E velocity: 52.50 cm/s MV A velocity: 70.00 cm/s  SHUNTS MV E/A ratio:  0.75        Systemic VTI:  0.12 m                            Systemic Diam: 1.60 cm Rudean Haskell MD Electronically signed by Rudean Haskell MD Signature Date/Time: 12/10/2022/12:45:47  PM    Final    DG Chest Port 1 View  Result Date: 12/09/2022 CLINICAL DATA:  Shortness of breath EXAM: PORTABLE CHEST 1 VIEW COMPARISON:  None FINDINGS: No pleural effusion. No pneumothorax. No focal airspace opacity. Normal cardiac and mediastinal contours. No radiographically apparent displaced rib fractures. Visualized upper abdomen is unremarkable. IMPRESSION: No acute cardiopulmonary disease. Electronically Signed   By: Marin Roberts M.D.   On: 12/09/2022 16:21    Cardiac Studies   See echo above  Impression   Principal Problem:   Acute respiratory failure with hypoxia (HCC) Active Problems:   Tobacco use disorder   Diarrhea   Unintentional weight loss   Acute hypoxic respiratory failure (HCC)   Recommendation   Acute on chronic right heart failure (cor pulmonale) Erik Ward presented with worsening shortness of breath and findings consistent with a dilated RV and probable RV volume overload.  RV systolic pressure was not elevated however may be underestimated.  I suspect this is due to chronic lung disease given smoking history and findings of "severe emphysematous changes" on chest CT back in 2003.  He has been on no medications.  Agree with plan for diuresis.  Would consider addition of spironolactone. May be helpful at some point to pursue right heart cath to determine degree  of pulmonary hypertension. Probable emphysematous disease Imaging evidence on CT of severe emphysematous disease in 2003 and longstanding smoking history.  Would recommend repeat high-resolution chest CT to further evaluate lung parenchyma.  Would likely benefit from pulmonary evaluation as well. Unintentional weight loss This is concerning in a smoker with a history of lung disease.  As above would recommend high-resolution chest CT to evaluate lung parenchyma and rule out lung malignancy. Tobacco use disorder Strongly counseled tobacco cessation.  Thanks for the consultation.  Cardiology will follow with you.  Time Spent Directly with Patient:  I have spent a total of 45 minutes with the patient reviewing hospital notes, telemetry, EKGs, labs and examining the patient as well as establishing an assessment and plan that was discussed personally with the patient.  > 50% of time was spent in direct patient care.  Length of Stay:  LOS: 0 days   Pixie Casino, MD, [facility], [place] Director of the Advanced Lipid Disorders &  Cardiovascular Risk Reduction Clinic Diplomate of the American Board of Clinical Lipidology Attending Cardiologist  Direct Dial: [phone]  Fax: [phone]  Website:  [email or web address] 12/10/2022, 4:54 PM

## 2022-12-11 ENCOUNTER — Inpatient Hospital Stay (HOSPITAL_COMMUNITY): Payer: 59

## 2022-12-11 DIAGNOSIS — J9601 Acute respiratory failure with hypoxia: Secondary | ICD-10-CM | POA: Diagnosis not present

## 2022-12-11 DIAGNOSIS — I2609 Other pulmonary embolism with acute cor pulmonale: Secondary | ICD-10-CM

## 2022-12-11 DIAGNOSIS — B348 Other viral infections of unspecified site: Secondary | ICD-10-CM | POA: Diagnosis not present

## 2022-12-11 DIAGNOSIS — I5023 Acute on chronic systolic (congestive) heart failure: Secondary | ICD-10-CM

## 2022-12-11 DIAGNOSIS — I509 Heart failure, unspecified: Secondary | ICD-10-CM | POA: Diagnosis not present

## 2022-12-11 DIAGNOSIS — I50812 Chronic right heart failure: Secondary | ICD-10-CM

## 2022-12-11 LAB — BLOOD GAS, VENOUS
Acid-Base Excess: 13.5 mmol/L — ABNORMAL HIGH (ref 0.0–2.0)
Bicarbonate: 41.5 mmol/L — ABNORMAL HIGH (ref 20.0–28.0)
Drawn by: 6364
O2 Saturation: 95.2 %
Patient temperature: 37
pCO2, Ven: 67 mmHg — ABNORMAL HIGH (ref 44–60)
pH, Ven: 7.4 (ref 7.25–7.43)
pO2, Ven: 76 mmHg — ABNORMAL HIGH (ref 32–45)

## 2022-12-11 LAB — GLUCOSE, CAPILLARY
Glucose-Capillary: 211 mg/dL — ABNORMAL HIGH (ref 70–99)
Glucose-Capillary: 152 mg/dL — ABNORMAL HIGH (ref 70–99)
Glucose-Capillary: 113 mg/dL — ABNORMAL HIGH (ref 70–99)
Glucose-Capillary: 168 mg/dL — ABNORMAL HIGH (ref 70–99)

## 2022-12-11 LAB — HEMOGLOBIN A1C
Hgb A1c MFr Bld: 6.8 % — ABNORMAL HIGH (ref 4.8–5.6)
Mean Plasma Glucose: 148 mg/dL

## 2022-12-11 LAB — CBC
HCT: 49.3 % (ref 39.0–52.0)
Hemoglobin: 15.6 g/dL (ref 13.0–17.0)
MCH: 30.9 pg (ref 26.0–34.0)
MCHC: 31.6 g/dL (ref 30.0–36.0)
MCV: 97.6 fL (ref 80.0–100.0)
Platelets: 275 10*3/uL (ref 150–400)
RBC: 5.05 MIL/uL (ref 4.22–5.81)
RDW: 14.6 % (ref 11.5–15.5)
WBC: 5.8 10*3/uL (ref 4.0–10.5)
nRBC: 0 % (ref 0.0–0.2)

## 2022-12-11 LAB — BASIC METABOLIC PANEL
Anion gap: 7 (ref 5–15)
BUN: 14 mg/dL (ref 8–23)
CO2: 34 mmol/L — ABNORMAL HIGH (ref 22–32)
Calcium: 8.5 mg/dL — ABNORMAL LOW (ref 8.9–10.3)
Chloride: 97 mmol/L — ABNORMAL LOW (ref 98–111)
Creatinine, Ser: 0.85 mg/dL (ref 0.61–1.24)
GFR, Estimated: >60 mL/min (ref >60)
Glucose, Bld: 138 mg/dL — ABNORMAL HIGH (ref 70–99)
Potassium: 3.7 mmol/L (ref 3.5–5.1)
Sodium: 138 mmol/L (ref 135–145)

## 2022-12-11 LAB — TROPONIN I (HIGH SENSITIVITY)
Troponin I (High Sensitivity): 22 ng/L — ABNORMAL HIGH (ref <18)
Troponin I (High Sensitivity): 21 ng/L — ABNORMAL HIGH (ref <18)

## 2022-12-11 LAB — LACTIC ACID, PLASMA: Lactic Acid, Venous: 2.3 mmol/L (ref 0.5–1.9)

## 2022-12-11 LAB — BRAIN NATRIURETIC PEPTIDE: B Natriuretic Peptide: 651.9 pg/mL — ABNORMAL HIGH (ref 0.0–100.0)

## 2022-12-11 MED ORDER — HEPARIN (PORCINE) 25000 UT/250ML-% IV SOLN
1000.0000 [IU]/h | INTRAVENOUS | Status: DC
  Administered 2022-12-11: 1000 [IU]/h via INTRAVENOUS
  Filled 2022-12-11 (×2): qty 250

## 2022-12-11 MED ORDER — ACETAZOLAMIDE ER 500 MG PO CP12
500.0000 mg | ORAL_CAPSULE | Freq: Once | ORAL | Status: DC
  Filled 2022-12-11: qty 1

## 2022-12-11 MED ORDER — IOHEXOL 350 MG/ML SOLN
75.0000 mL | Freq: Once | INTRAVENOUS | Status: AC | PRN
  Administered 2022-12-11: 75 mL via INTRAVENOUS

## 2022-12-11 MED ORDER — INSULIN ASPART 100 UNIT/ML IJ SOLN
0.0000 [IU] | Freq: Three times a day (TID) | INTRAMUSCULAR | Status: DC
  Administered 2022-12-11 – 2022-12-12 (×2): 2 [IU] via SUBCUTANEOUS

## 2022-12-11 MED ORDER — INSULIN ASPART 100 UNIT/ML IJ SOLN: 0.0000 [IU] | Freq: Every day | INTRAMUSCULAR | Status: DC

## 2022-12-11 MED ORDER — UMECLIDINIUM-VILANTEROL 62.5-25 MCG/ACT IN AEPB
1.0000 | INHALATION_SPRAY | Freq: Every day | RESPIRATORY_TRACT | Status: DC
  Administered 2022-12-11 – 2022-12-12 (×2): 1 via RESPIRATORY_TRACT
  Filled 2022-12-11: qty 14

## 2022-12-11 MED ORDER — HEPARIN BOLUS VIA INFUSION
4000.0000 [IU] | Freq: Once | INTRAVENOUS | Status: AC
  Administered 2022-12-11: 4000 [IU] via INTRAVENOUS
  Filled 2022-12-11: qty 4000

## 2022-12-11 MED ORDER — FUROSEMIDE 10 MG/ML IJ SOLN: 40.0000 mg | Freq: Once | INTRAMUSCULAR | Status: DC

## 2022-12-11 NOTE — Progress Notes (Signed)
DAILY PROGRESS NOTE   Patient Name: Erik Ward. Date of Encounter: 12/11/2022 Cardiologist: Pixie Casino, MD  Chief Complaint   Breathing better  Patient Profile   67 yo male with shortness of breath, found to have probable acute right heart failure   Subjective   Net negative 800 ml overnight.  Creatinine stable. On steroids, treatment for COPD - off oxygen, O2 sat was 84. Planned CT CAP today.  Objective   Vitals:   12/10/22 1941 12/10/22 2253 12/11/22 0338 12/11/22 0822  BP: (!) 93/58 112/79 102/65 106/73  Pulse: 90 92 (!) 101 73  Resp: '18  18 18  '$ Temp: 98 F (36.7 C) 97.8 F (36.6 C) 97.8 F (36.6 C) 97.6 F (36.4 C)  TempSrc: Oral Oral Oral Oral  SpO2: 94% 96% 90% 96%  Weight:   62.6 kg   Height:        Intake/Output Summary (Last 24 hours) at 12/11/2022 0910 Last data filed at 12/11/2022 [id number] Gross per 24 hour  Intake 1038 ml  Output 1722 ml  Net -684 ml   Filed Weights   12/09/22 1501 12/10/22 1341 12/11/22 0338  Weight: 68 kg 60 kg 62.6 kg    Physical Exam   General appearance: alert and no distress Neck: JVD - 1 cm above sternal notch, no carotid bruit, supple, symmetrical, trachea midline, and thyroid not enlarged, symmetric, no tenderness/mass/nodules Lungs: diminished breath sounds bibasilar Heart: regular rate and rhythm Extremities: extremities normal, atraumatic, no cyanosis or edema Neurologic: Grossly normal  Inpatient Medications    Scheduled Meds:  enoxaparin (LOVENOX) injection  40 mg Subcutaneous Q24H   predniSONE  40 mg Oral Q2000   umeclidinium-vilanterol  1 puff Inhalation Daily    Continuous Infusions:   PRN Meds: acetaminophen **OR** acetaminophen, ipratropium-albuterol   Labs   Results for orders placed or performed during the hospital encounter of 12/09/22 (from the past 48 hour(s))  Basic metabolic panel     Status: Abnormal   Collection Time: 12/09/22  3:03 PM  Result Value Ref Range   Sodium 136  135 - 145 mmol/L   Potassium 4.2 3.5 - 5.1 mmol/L   Chloride 101 98 - 111 mmol/L   CO2 24 22 - 32 mmol/L   Glucose, Bld 99 70 - 99 mg/dL    Comment: Glucose reference range applies only to samples taken after fasting for at least 8 hours.   BUN 26 (H) 8 - 23 mg/dL   Creatinine, Ser 1.04 0.61 - 1.24 mg/dL   Calcium 8.7 (L) 8.9 - 10.3 mg/dL   GFR, Estimated >60 >60 mL/min    Comment: (NOTE) Calculated using the CKD-EPI Creatinine Equation (2021)    Anion gap 11 5 - 15    Comment: Performed at [facility] [address], [place], [place] [address]  CBC with Differential     Status: Abnormal   Collection Time: 12/09/22  3:03 PM  Result Value Ref Range   WBC 5.4 4.0 - 10.5 K/uL   RBC 5.45 4.22 - 5.81 MIL/uL   Hemoglobin 17.2 (H) 13.0 - 17.0 g/dL   HCT 52.5 (H) 39.0 - 52.0 %   MCV 96.3 80.0 - 100.0 fL   MCH 31.6 26.0 - 34.0 pg   MCHC 32.8 30.0 - 36.0 g/dL   RDW 14.9 11.5 - 15.5 %   Platelets 251 150 - 400 K/uL   nRBC 0.0 0.0 - 0.2 %   Neutrophils Relative % 73 %  Neutro Abs 4.0 1.7 - 7.7 K/uL   Lymphocytes Relative 16 %   Lymphs Abs 0.9 0.7 - 4.0 K/uL   Monocytes Relative 10 %   Monocytes Absolute 0.6 0.1 - 1.0 K/uL   Eosinophils Relative 0 %   Eosinophils Absolute 0.0 0.0 - 0.5 K/uL   Basophils Relative 0 %   Basophils Absolute 0.0 0.0 - 0.1 K/uL   Immature Granulocytes 1 %   Abs Immature Granulocytes 0.03 0.00 - 0.07 K/uL    Comment: Performed at [facility] [address], [place], [place] [address]  Brain natriuretic peptide     Status: Abnormal   Collection Time: 12/09/22  3:03 PM  Result Value Ref Range   B Natriuretic Peptide 1,787.5 (H) 0.0 - 100.0 pg/mL    Comment: Performed at [facility] [address] [place], [place] [address]  Troponin I (High Sensitivity)     Status: Abnormal   Collection Time: 12/09/22  3:03 PM  Result Value Ref Range   Troponin I (High Sensitivity) 57 (H) <18 ng/L    Comment: (NOTE) Elevated high sensitivity  troponin I (hsTnI) values and significant  changes across serial measurements may suggest ACS but many other  chronic and acute conditions are known to elevate hsTnI results.  Refer to the "Links" section for chest pain algorithms and additional  guidance. Performed at [facility], [place] [address] [place], [place] [address]   TSH     Status: None   Collection Time: 12/09/22  5:51 PM  Result Value Ref Range   TSH 0.919 0.350 - 4.500 uIU/mL    Comment: Performed by a 3rd Generation assay with a functional sensitivity of <=0.01 uIU/mL. Performed at [facility], [place] [address] [place], [place] [address]   Resp panel by RT-PCR (RSV, Flu A&B, Covid) Anterior Nasal Swab     Status: None   Collection Time: 12/09/22  6:20 PM   Specimen: Anterior Nasal Swab  Result Value Ref Range   SARS Coronavirus 2 by RT PCR NEGATIVE NEGATIVE   Influenza A by PCR NEGATIVE NEGATIVE   Influenza B by PCR NEGATIVE NEGATIVE    Comment: (NOTE) The Xpert Xpress SARS-CoV-2/FLU/RSV plus assay is intended as an aid in the diagnosis of influenza from Nasopharyngeal swab specimens and should not be used as a sole basis for treatment. Nasal washings and aspirates are unacceptable for Xpert Xpress SARS-CoV-2/FLU/RSV testing.  Fact Sheet for Patients: EntrepreneurPulse.com.au  Fact Sheet for Healthcare Providers: IncredibleEmployment.be  This test is not yet approved or cleared by the [place] FDA and has been authorized for detection and/or diagnosis of SARS-CoV-2 by FDA under an Emergency Use Authorization (EUA). This EUA will remain in effect (meaning this test can be used) for the duration of the COVID-19 declaration under Section 564(b)(1) of the Act, 21 U.S.C. section 360bbb-3(b)(1), unless the authorization is terminated or revoked.     Resp Syncytial Virus by PCR NEGATIVE NEGATIVE    Comment: (NOTE) Fact Sheet for  Patients: EntrepreneurPulse.com.au  Fact Sheet for Healthcare Providers: IncredibleEmployment.be  This test is not yet approved or cleared by the [place] FDA and has been authorized for detection and/or diagnosis of SARS-CoV-2 by FDA under an Emergency Use Authorization (EUA). This EUA will remain in effect (meaning this test can be used) for the duration of the COVID-19 declaration under Section 564(b)(1) of the Act, 21 U.S.C. section 360bbb-3(b)(1), unless the authorization is terminated or revoked.  Performed  at [facility], [place] [address] [place], [place] [address]   Respiratory (~20 pathogens) panel by PCR     Status: Abnormal   Collection Time: 12/09/22  6:20 PM   Specimen: Anterior Nasal Swab; Respiratory  Result Value Ref Range   Adenovirus NOT DETECTED NOT DETECTED   Coronavirus 229E NOT DETECTED NOT DETECTED    Comment: (NOTE) The Coronavirus on the Respiratory Panel, DOES NOT test for the novel  Coronavirus (2019 nCoV)    Coronavirus HKU1 NOT DETECTED NOT DETECTED   Coronavirus NL63 NOT DETECTED NOT DETECTED   Coronavirus OC43 NOT DETECTED NOT DETECTED   Metapneumovirus NOT DETECTED NOT DETECTED   Rhinovirus / Enterovirus NOT DETECTED NOT DETECTED   Influenza A NOT DETECTED NOT DETECTED   Influenza B NOT DETECTED NOT DETECTED   Parainfluenza Virus 1 NOT DETECTED NOT DETECTED   Parainfluenza Virus 2 NOT DETECTED NOT DETECTED   Parainfluenza Virus 3 DETECTED (A) NOT DETECTED   Parainfluenza Virus 4 NOT DETECTED NOT DETECTED   Respiratory Syncytial Virus NOT DETECTED NOT DETECTED   Bordetella pertussis NOT DETECTED NOT DETECTED   Bordetella Parapertussis NOT DETECTED NOT DETECTED   Chlamydophila pneumoniae NOT DETECTED NOT DETECTED   Mycoplasma pneumoniae NOT DETECTED NOT DETECTED    Comment: Performed at [facility], [place] [address] [place], [place] [address]  Troponin I (High Sensitivity)     Status:  Abnormal   Collection Time: 12/09/22  8:08 PM  Result Value Ref Range   Troponin I (High Sensitivity) 46 (H) <18 ng/L    Comment: (NOTE) Elevated high sensitivity troponin I (hsTnI) values and significant  changes across serial measurements may suggest ACS but many other  chronic and acute conditions are known to elevate hsTnI results.  Refer to the "Links" section for chest pain algorithms and additional  guidance. Performed at [facility], [place] [address], [place], [place] [address]   Sedimentation rate     Status: None   Collection Time: 12/09/22  8:08 PM  Result Value Ref Range   Sed Rate 1 0 - 16 mm/hr    Comment: Performed at [facility] [address] [place], [place] [address]  C-reactive protein     Status: Abnormal   Collection Time: 12/09/22  8:08 PM  Result Value Ref Range   CRP 10.2 (H) <1.0 mg/dL    Comment: Performed at [facility], [place] [address], [place], [place] [id number]  Basic metabolic panel     Status: Abnormal   Collection Time: 12/10/22  4:45 AM  Result Value Ref Range   Sodium 136 135 - 145 mmol/L   Potassium 4.0 3.5 - 5.1 mmol/L   Chloride 100 98 - 111 mmol/L   CO2 28 22 - 32 mmol/L   Glucose, Bld 151 (H) 70 - 99 mg/dL    Comment: Glucose reference range applies only to samples taken after fasting for at least 8 hours.   BUN 25 (H) 8 - 23 mg/dL   Creatinine, Ser 0.93 0.61 - 1.24 mg/dL   Calcium 8.3 (L) 8.9 - 10.3 mg/dL   GFR, Estimated >60 >60 mL/min    Comment: (NOTE) Calculated using the CKD-EPI Creatinine Equation (2021)    Anion gap 8 5 - 15    Comment: Performed at [facility] [address], [place], [place] [address]  CBC     Status: None   Collection Time: 12/10/22  4:45 AM  Result  Value Ref Range   WBC 5.6 4.0 - 10.5 K/uL   RBC 5.10 4.22 - 5.81 MIL/uL   Hemoglobin 16.1 13.0 - 17.0 g/dL   HCT 48.8 39.0 - 52.0 %   MCV 95.7 80.0 - 100.0 fL   MCH 31.6 26.0 - 34.0 pg   MCHC 33.0 30.0 - 36.0 g/dL   RDW 14.6  11.5 - 15.5 %   Platelets 244 150 - 400 K/uL   nRBC 0.0 0.0 - 0.2 %    Comment: Performed at [facility], [place] [address], [place], [place] [address]  HIV Antibody (routine testing w rflx)     Status: None   Collection Time: 12/10/22  4:45 AM  Result Value Ref Range   HIV Screen 4th Generation wRfx Non Reactive Non Reactive    Comment: Performed at [facility], [place] [address] [place], [place] [address]  Hepatic function panel     Status: Abnormal   Collection Time: 12/10/22  4:45 AM  Result Value Ref Range   Total Protein 5.6 (L) 6.5 - 8.1 g/dL   Albumin 2.5 (L) 3.5 - 5.0 g/dL   AST 54 (H) 15 - 41 U/L   ALT 55 (H) 0 - 44 U/L   Alkaline Phosphatase 110 38 - 126 U/L   Total Bilirubin 1.0 0.3 - 1.2 mg/dL   Bilirubin, Direct 0.3 (H) 0.0 - 0.2 mg/dL   Indirect Bilirubin 0.7 0.3 - 0.9 mg/dL    Comment: Performed at [facility] [address], [place], [place] [address]  CBG monitoring, ED     Status: Abnormal   Collection Time: 12/10/22  8:24 AM  Result Value Ref Range   Glucose-Capillary 146 (H) 70 - 99 mg/dL    Comment: Glucose reference range applies only to samples taken after fasting for at least 8 hours.   Comment 1 Notify RN    Comment 2 Document in Chart   Occult blood card to lab, stool     Status: None   Collection Time: 12/10/22  5:35 PM  Result Value Ref Range   Fecal Occult Bld NEGATIVE NEGATIVE    Comment: Performed at [facility], [address] [place], [place] [address]  CBC     Status: None   Collection Time: 12/11/22 12:24 AM  Result Value Ref Range   WBC 5.8 4.0 - 10.5 K/uL   RBC 5.05 4.22 - 5.81 MIL/uL   Hemoglobin 15.6 13.0 - 17.0 g/dL   HCT 49.3 39.0 - 52.0 %   MCV 97.6 80.0 - 100.0 fL   MCH 30.9 26.0 - 34.0 pg   MCHC 31.6 30.0 - 36.0 g/dL   RDW 14.6 11.5 - 15.5 %   Platelets 275 150 - 400 K/uL   nRBC 0.0 0.0 - 0.2 %    Comment: Performed at [facility], [place] [address] [place], [place] [id number]  Basic metabolic  panel     Status: Abnormal   Collection Time: 12/11/22 12:24 AM  Result Value Ref Range   Sodium 138 135 - 145 mmol/L   Potassium 3.7 3.5 - 5.1 mmol/L   Chloride 97 (L) 98 - 111 mmol/L   CO2 34 (H) 22 - 32 mmol/L   Glucose, Bld 138 (H) 70 - 99 mg/dL    Comment: Glucose reference range applies only to samples taken after fasting for at least 8 hours.   BUN 14 8 - 23 mg/dL   Creatinine, Ser  0.85 0.61 - 1.24 mg/dL   Calcium 8.5 (L) 8.9 - 10.3 mg/dL   GFR, Estimated >60 >60 mL/min    Comment: (NOTE) Calculated using the CKD-EPI Creatinine Equation (2021)    Anion gap 7 5 - 15    Comment: Performed at [facility] [address], [place], [place] [address]  Blood gas, venous     Status: Abnormal   Collection Time: 12/11/22  8:00 AM  Result Value Ref Range   pH, Ven 7.4 7.25 - 7.43   pCO2, Ven 67 (H) 44 - 60 mmHg   pO2, Ven 76 (H) 32 - 45 mmHg   Bicarbonate 41.5 (H) 20.0 - 28.0 mmol/L   Acid-Base Excess 13.5 (H) 0.0 - 2.0 mmol/L   O2 Saturation 95.2 %   Patient temperature 37.0    Collection site VENOUS    Drawn by [id number]     Comment: Performed at [facility] [address] [place], [place] [address]  Glucose, capillary     Status: Abnormal   Collection Time: 12/11/22  8:50 AM  Result Value Ref Range   Glucose-Capillary 211 (H) 70 - 99 mg/dL    Comment: Glucose reference range applies only to samples taken after fasting for at least 8 hours.    ECG   N/A  Telemetry   Sinus rhythm with RBBB - Personally Reviewed  Radiology    ECHOCARDIOGRAM COMPLETE  Result Date: 12/10/2022    ECHOCARDIOGRAM REPORT   Patient Name:   Melvis Belleau. Date of Exam: 12/10/2022 Medical Rec #:  [id number]           Height:       67.0 in Accession #:    [id number]          Weight:       150.0 lb Date of Birth:  [birth year]           BSA:          1.790 m Patient Age:    31 years            BP:           100/75 mmHg Patient Gender: M                   HR:           80 bpm. Exam  Location:  Inpatient Procedure: 2D Echo, Cardiac Doppler and Color Doppler Indications:    respiratory distress  History:        Patient has prior history of Echocardiogram examinations. Risk                 Factors:Current Smoker.  Sonographer:    Danne Baxter RDCS, FE, PE Referring Phys: [id number] Velna Ochs  Sonographer Comments: Technically difficult study due to poor echo windows. IMPRESSIONS  1. Left ventricular ejection fraction, by estimation, is 55 to 60%. The left ventricle has normal function. The left ventricle has no regional wall motion abnormalities. Left ventricular diastolic parameters are indeterminate. There is the interventricular septum is flattened in systole, consistent with right ventricular pressure overload.  2. Right ventricular systolic function is normal. The right ventricular size is severely enlarged. There is normal pulmonary artery systolic pressure, but this may due to equalization of pressures. Study suggestive of Cor Pulmonale.  3. Left atrial size was severely dilated.  4. The mitral valve is grossly normal. No evidence of mitral valve regurgitation.  5. The aortic valve was not  well visualized. Aortic valve regurgitation is not visualized.  6. The inferior vena cava is dilated in size with <50% respiratory variability, suggesting right atrial pressure of 15 mmHg. Comparison(s): No prior Echocardiogram. FINDINGS  Left Ventricle: Left ventricular ejection fraction, by estimation, is 55 to 60%. The left ventricle has normal function. The left ventricle has no regional wall motion abnormalities. The left ventricular internal cavity size was normal in size. There is  no left ventricular hypertrophy. The interventricular septum is flattened in systole, consistent with right ventricular pressure overload. Left ventricular diastolic parameters are indeterminate. Right Ventricle: The right ventricular size is severely enlarged. No increase in right ventricular wall thickness. Right  ventricular systolic function is normal. There is normal pulmonary artery systolic pressure. The tricuspid regurgitant velocity is 2.16 m/s, and with an assumed right atrial pressure of 15 mmHg, the estimated right ventricular systolic pressure is [id number] mmHg. Left Atrium: Left atrial size was severely dilated. Right Atrium: Right atrial size was normal in size. Pericardium: Trivial pericardial effusion is present. The pericardial effusion is anterior to the right ventricle. Mitral Valve: The mitral valve is grossly normal. No evidence of mitral valve regurgitation. Tricuspid Valve: The tricuspid valve is normal in structure. Tricuspid valve regurgitation is mild. Aortic Valve: The aortic valve was not well visualized. Aortic valve regurgitation is not visualized. Pulmonic Valve: The pulmonic valve was not well visualized. Pulmonic valve regurgitation is trivial. Aorta: The aortic root is normal in size and structure. Venous: The inferior vena cava is dilated in size with less than 50% respiratory variability, suggesting right atrial pressure of 15 mmHg. IAS/Shunts: No atrial level shunt detected by color flow Doppler.  LEFT VENTRICLE PLAX 2D LVIDd:         4.00 cm   Diastology LVIDs:         2.40 cm   LV e' medial:    4.51 cm/s LV PW:         1.00 cm   LV E/e' medial:  11.6 LV IVS:        0.80 cm   LV e' lateral:   7.15 cm/s LVOT diam:     1.60 cm   LV E/e' lateral: 7.3 LV SV:         25 LV SV Index:   14 LVOT Area:     2.01 cm  RIGHT VENTRICLE RV S prime:     10.60 cm/s TAPSE (M-mode): 1.8 cm LEFT ATRIUM           Index        RIGHT ATRIUM           Index LA diam:      3.30 cm 1.84 cm/m   RA Area:     31.90 cm LA Vol (A4C): 44.2 ml 24.70 ml/m  RA Volume:   141.00 ml 78.79 ml/m  AORTIC VALVE LVOT Vmax:   96.70 cm/s LVOT Vmean:  53.800 cm/s LVOT VTI:    0.124 m  AORTA Ao Root diam: 3.30 cm MITRAL VALVE               TRICUSPID VALVE MV Area (PHT): 3.63 cm    TR Peak grad:   18.7 mmHg MV Decel Time: 209 msec    TR  Vmax:        216.00 cm/s MV E velocity: 52.50 cm/s MV A velocity: 70.00 cm/s  SHUNTS MV E/A ratio:  0.75        Systemic VTI:  0.12 m  Systemic Diam: 1.60 cm Rudean Haskell MD Electronically signed by Rudean Haskell MD Signature Date/Time: 12/10/2022/12:45:47 PM    Final    DG Chest Port 1 View  Result Date: 12/09/2022 CLINICAL DATA:  Shortness of breath EXAM: PORTABLE CHEST 1 VIEW COMPARISON:  None FINDINGS: No pleural effusion. No pneumothorax. No focal airspace opacity. Normal cardiac and mediastinal contours. No radiographically apparent displaced rib fractures. Visualized upper abdomen is unremarkable. IMPRESSION: No acute cardiopulmonary disease. Electronically Signed   By: Marin Roberts M.D.   On: 12/09/2022 16:21    Cardiac Studies   Echo above  Assessment   Principal Problem:   Acute respiratory failure with hypoxia (HCC) Active Problems:   Tobacco use disorder   Diarrhea   Unintentional weight loss   Acute hypoxic respiratory failure ([place])   Plan   Appears near euvolemic today - hold on additional diuretics. BUN rising, but has had 2 doses of steroid which could also cause this. Will likely need a low dose maintenance diuretic tomorrow.  Time Spent Directly with Patient:  I have spent a total of 25 minutes with the patient reviewing hospital notes, telemetry, EKGs, labs and examining the patient as well as establishing an assessment and plan that was discussed personally with the patient.  > 50% of time was spent in direct patient care.  Length of Stay:  LOS: 1 day   Pixie Casino, MD, [facility], [place] Director of the [facility] Lipid Disorders &  Cardiovascular Risk Reduction [facility] Diplomate of the American Board of Clinical Lipidology Attending Cardiologist  Direct Dial: [phone]  Fax: [phone]  Website:  [email or web address] 12/11/2022, 9:10 AM

## 2022-12-11 NOTE — Plan of Care (Signed)

## 2022-12-11 NOTE — Progress Notes (Addendum)
HD#1 Subjective:   Summary: Erik Crumbliss. is a 67 y.o. male with a history of subarachnoid hemorrhage presenting to the hospital with concerns of shortness of breath, diarrhea, and fatigue.  Patient admitted for further evaluation and management of acute hypoxic respiratory failure.  Overnight Events: No overnight events  Patient examined this morning.  Patient reports he is doing better he states that he is not short of breath at rest.  He states his exertional dyspnea is improving.  He states he is still having 2-3 bowel movements daily.  These bowel movements are still watery in nature.  He denies any fevers or chills.  Objective:  Vital signs in last 24 hours: Vitals:   12/10/22 1941 12/10/22 2253 12/11/22 0338 12/11/22 0822  BP: (!) 93/58 112/79 102/65 106/73  Pulse: 90 92 (!) 101 73  Resp: '18  18 18  '$ Temp: 98 F (36.7 C) 97.8 F (36.6 C) 97.8 F (36.6 C) 97.6 F (36.4 C)  TempSrc: Oral Oral Oral Oral  SpO2: 94% 96% 90% 96%  Weight:   62.6 kg   Height:       Supplemental O2: Nasal Cannula SpO2: 96 % O2 Flow Rate (L/min): 3 L/min   Physical Exam:  Constitutional: Nasal cannula in place, resting in bed no acute distress HENT: normocephalic atraumatic Eyes: conjunctiva non-erythematous Cardiovascular: regular rate and rhythm, no m/r/g Pulmonary/Chest: Normal work of breathing noted on 3 L nasal cannula, decreased breath sounds noted throughout, no wheezing, rales, Abdomen: Normoactive bowel sounds.  No abdomen tenderness Extremities: 1+ pitting edema noted bilaterally  Filed Weights   12/09/22 1501 12/10/22 1341 12/11/22 0338  Weight: 68 kg 60 kg 62.6 kg     Intake/Output Summary (Last 24 hours) at 12/11/2022 1022 Last data filed at 12/11/2022 [id number] Gross per 24 hour  Intake 1038 ml  Output 1722 ml  Net -684 ml   Net IO Since Admission: -684 mL [12/11/22 1022]  Pertinent Labs:    Latest Ref Rng & Units 12/11/2022   12:24 AM 12/10/2022    4:45 AM  12/09/2022    3:03 PM  CBC  WBC 4.0 - 10.5 K/uL 5.8  5.6  5.4   Hemoglobin 13.0 - 17.0 g/dL 15.6  16.1  17.2   Hematocrit 39.0 - 52.0 % 49.3  48.8  52.5   Platelets 150 - 400 K/uL 275  244  251        Latest Ref Rng & Units 12/11/2022   12:24 AM 12/10/2022    4:45 AM 12/09/2022    3:03 PM  CMP  Glucose 70 - 99 mg/dL 138  151  99   BUN 8 - 23 mg/dL '14  25  26   '$ Creatinine 0.61 - 1.24 mg/dL 0.85  0.93  1.04   Sodium 135 - 145 mmol/L 138  136  136   Potassium 3.5 - 5.1 mmol/L 3.7  4.0  4.2   Chloride 98 - 111 mmol/L 97  100  101   CO2 22 - 32 mmol/L 34  28  24   Calcium 8.9 - 10.3 mg/dL 8.5  8.3  8.7   Total Protein 6.5 - 8.1 g/dL  5.6    Total Bilirubin 0.3 - 1.2 mg/dL  1.0    Alkaline Phos 38 - 126 U/L  110    AST 15 - 41 U/L  54    ALT 0 - 44 U/L  55      Imaging: ECHOCARDIOGRAM COMPLETE  Result  Date: 12/10/2022    ECHOCARDIOGRAM REPORT   Patient Name:   Erik Oclair. Date of Exam: 12/10/2022 Medical Rec #:  [id number]           Height:       67.0 in Accession #:    [id number]          Weight:       150.0 lb Date of Birth:  12-09-1955           BSA:          1.790 m Patient Age:    38 years            BP:           100/75 mmHg Patient Gender: M                   HR:           80 bpm. Exam Location:  Inpatient Procedure: 2D Echo, Cardiac Doppler and Color Doppler Indications:    respiratory distress  History:        Patient has prior history of Echocardiogram examinations. Risk                 Factors:Current Smoker.  Sonographer:    Danne Baxter RDCS, FE, PE Referring Phys: [id number] Velna Ochs  Sonographer Comments: Technically difficult study due to poor echo windows. IMPRESSIONS  1. Left ventricular ejection fraction, by estimation, is 55 to 60%. The left ventricle has normal function. The left ventricle has no regional wall motion abnormalities. Left ventricular diastolic parameters are indeterminate. There is the interventricular septum is flattened in systole, consistent  with right ventricular pressure overload.  2. Right ventricular systolic function is normal. The right ventricular size is severely enlarged. There is normal pulmonary artery systolic pressure, but this may due to equalization of pressures. Study suggestive of Cor Pulmonale.  3. Left atrial size was severely dilated.  4. The mitral valve is grossly normal. No evidence of mitral valve regurgitation.  5. The aortic valve was not well visualized. Aortic valve regurgitation is not visualized.  6. The inferior vena cava is dilated in size with <50% respiratory variability, suggesting right atrial pressure of 15 mmHg. Comparison(s): No prior Echocardiogram. FINDINGS  Left Ventricle: Left ventricular ejection fraction, by estimation, is 55 to 60%. The left ventricle has normal function. The left ventricle has no regional wall motion abnormalities. The left ventricular internal cavity size was normal in size. There is  no left ventricular hypertrophy. The interventricular septum is flattened in systole, consistent with right ventricular pressure overload. Left ventricular diastolic parameters are indeterminate. Right Ventricle: The right ventricular size is severely enlarged. No increase in right ventricular wall thickness. Right ventricular systolic function is normal. There is normal pulmonary artery systolic pressure. The tricuspid regurgitant velocity is 2.16 m/s, and with an assumed right atrial pressure of 15 mmHg, the estimated right ventricular systolic pressure is [id number] mmHg. Left Atrium: Left atrial size was severely dilated. Right Atrium: Right atrial size was normal in size. Pericardium: Trivial pericardial effusion is present. The pericardial effusion is anterior to the right ventricle. Mitral Valve: The mitral valve is grossly normal. No evidence of mitral valve regurgitation. Tricuspid Valve: The tricuspid valve is normal in structure. Tricuspid valve regurgitation is mild. Aortic Valve: The aortic valve was  not well visualized. Aortic valve regurgitation is not visualized. Pulmonic Valve: The pulmonic valve was not well visualized. Pulmonic valve regurgitation is trivial. Aorta: The aortic root  is normal in size and structure. Venous: The inferior vena cava is dilated in size with less than 50% respiratory variability, suggesting right atrial pressure of 15 mmHg. IAS/Shunts: No atrial level shunt detected by color flow Doppler.  LEFT VENTRICLE PLAX 2D LVIDd:         4.00 cm   Diastology LVIDs:         2.40 cm   LV e' medial:    4.51 cm/s LV PW:         1.00 cm   LV E/e' medial:  11.6 LV IVS:        0.80 cm   LV e' lateral:   7.15 cm/s LVOT diam:     1.60 cm   LV E/e' lateral: 7.3 LV SV:         25 LV SV Index:   14 LVOT Area:     2.01 cm  RIGHT VENTRICLE RV S prime:     10.60 cm/s TAPSE (M-mode): 1.8 cm LEFT ATRIUM           Index        RIGHT ATRIUM           Index LA diam:      3.30 cm 1.84 cm/m   RA Area:     31.90 cm LA Vol (A4C): 44.2 ml 24.70 ml/m  RA Volume:   141.00 ml 78.79 ml/m  AORTIC VALVE LVOT Vmax:   96.70 cm/s LVOT Vmean:  53.800 cm/s LVOT VTI:    0.124 m  AORTA Ao Root diam: 3.30 cm MITRAL VALVE               TRICUSPID VALVE MV Area (PHT): 3.63 cm    TR Peak grad:   18.7 mmHg MV Decel Time: 209 msec    TR Vmax:        216.00 cm/s MV E velocity: 52.50 cm/s MV A velocity: 70.00 cm/s  SHUNTS MV E/A ratio:  0.75        Systemic VTI:  0.12 m                            Systemic Diam: 1.60 cm Rudean Haskell MD Electronically signed by Rudean Haskell MD Signature Date/Time: 12/10/2022/12:45:47 PM    Final     Assessment/Plan:   Principal Problem:   Acute respiratory failure with hypoxia ([place]) Active Problems:   Tobacco use disorder   Diarrhea   Unintentional weight loss   Acute hypoxic respiratory failure [facility])  Patient Summary: Erik Gaetz. is a 67 y.o. male with a history of subarachnoid hemorrhage presenting to the hospital with concerns of shortness of breath, diarrhea,  and fatigue.  Patient admitted for further evaluation and management of acute hypoxic respiratory failure.   #Acute hypoxic respiratory failure likely secondary to acute on chronic right heart failure (cor pulmonale) and suspected COPD exacerbation #Parainfluenza infection Patient evaluated this morning.  Patient states his shortness of breath is improving.  Patient mains on 3 L nasal cannula.  On exam, patient does have 1+ pitting edema bilaterally.  No wheezing noted.  Patient is moving air, with decreased breath sounds noted.  Echo revealing severely enlarged right ventricle.  Increased right atrial pressure.  Pulmonary artery pressure within normal limits on echo, but could be falsely low.  Findings consistent with cor pulmonale.  Cor pulmonale likely in the setting of suspected group 3 pulmonary artery hypertension.  With cor pulmonale  along with parainfluenza infection, could have exacerbated suspected COPD.  LFTs likely elevated secondary to vascular congestion in the setting of right heart failure. Today is day 3 of steroids.  Patient could benefit from right heart cath to evaluate for pulmonary artery hypertension.  Patient also can benefit from PFTs in outpatient setting to diagnose COPD.  Output of 1700 mL yesterday to 40 of Lasix.  Cardiology suggesting high-res CT scan, to evaluate for malignancy.  Will obtain CT chest, abdomen, pelvis. -Day 3 of 5 of prednisone '40mg'$  -Start Anoro Ellipta -DuoNebs as needed -Hold diuresis today -Cardiology following, appreciate recs -Daily weights -Strict I's and O's -PFTs in outpatient setting -Monitor respiratory status -Wean off oxygen as tolerated to maintain SpO2 greater than 92%   #Chronic diarrhea Patient remains having 2-3 liquidy bowel movements.  Could be related to underlying heart failure.  Patient denies any fevers or chills.  Still need to collect stool to evaluate for infectious etiology.  Will also need to workup for malignancy with  symptoms and weight loss.  Differential includes pancreatic insufficiency, microscopic colitis, malignancy, celiac disease, infection.   -Follow-up lab workup, and stool studies -Obtain CT chest abdomen pelvis -Will need outpatient colonoscopy   #Metabolic alkalosis BMP revealing elevated bicarb today.  Likely in the setting of diuresing causing contraction alkalosis.  VBG obtained, showing pH 7.4/pCO2 67.  Likely compensating well.  This could also be CO2 retention given patient has a long smoking history, in which bicarb could be elevated in compensation of respiratory acidosis. -Repeat BMP in a.m.  #Prediabetes A1c still pending.  Since patient is on steroids, and glucose trending upwards, will start sliding scale insulin. -Initiate sliding scale insulin -A1c pending   #History of Subarachnoid hemorrhage  No acute concerns -Continue to monitor  Diet: Heart Healthy VTE: Enoxaparin IVF: None,None Code: Full  Dispo: Anticipated discharge to Home in 3 days pending clinical improvement.   [facility] Resident PGY-1 [phone] Please contact the on call pager after 5 pm and on weekends at [phone].

## 2022-12-11 NOTE — Consult Note (Signed)
NAME:  Erik Ward., MRN:  [id number], DOB:  [birth year], LOS: 1 ADMISSION DATE:  12/09/2022, CONSULTATION DATE:  12/11/22 REFERRING MD:  Philipp Ovens CHIEF COMPLAINT:  Dyspnea   History of Present Illness:  Erik Ward is seen in consultation at the request of IMTS for recommendations on further evaluation and management of PE.  67 year old man who presented to [facility] 3/11 with progressive dyspnea x 2 months, orthopnea, diarrhea, ~20lb unintentional weight loss. Has not received regular medical care recently. PMHx significant for syncope/collapse, SAH, ?COPD, ?CHF.  Admitted by IMTS for acute hypoxic respiratory failure felt to be 2/2 COPD exacerbation vs CHF exacerbation, +parainfluenza virus. Echo was ordered demonstrating normal LVEF and severe RV dilatation with low to normal RV function. He was treated with prednisone, bronchodilators, Lasix.  On 3/13, CT Chest/A/P was completed as part of ongoing workup which demonstrated moderately large amount of eccentric thrombus in the left main PA with RV/LV + 1.63, bibasilar aspiration R > L with small effusion, anasarca, emphysema.   CTA Chest was subsequently ordered and PCCM asked to see in consultation.  Pertinent Medical History:   Past Medical History:  Diagnosis Date   Subarachnoid hemorrhage following injury, with loss of consciousness ([place]) 09/18/2012   syncope and collapse (09/18/2012)   Syncope and collapse 09/18/2012   w/loss of consciousness (09/18/2012)   Significant Hospital Events: Including procedures, antibiotic start and stop dates in addition to other pertinent events   3/11 Admit 3/13 CT Chest/Abd/Pelv demonstrated PE. Dedicated CTA chest with known L main PE with R heart strain (ratio 1.6).  Interim History / Subjective:  PCCM consulted for recommendations in the setting of PE  Objective:  Blood pressure 103/74, pulse 78, temperature 97.6 F (36.4 C), temperature source Oral, resp. rate 18, height '5\' 7"'$  (1.702 m),  weight 62.6 kg, SpO2 93 %.        Intake/Output Summary (Last 24 hours) at 12/11/2022 1838 Last data filed at 12/11/2022 1812 Gross per 24 hour  Intake 1038 ml  Output 571 ml  Net 467 ml   Filed Weights   12/09/22 1501 12/10/22 1341 12/11/22 0338  Weight: 68 kg 60 kg 62.6 kg   Physical Examination: General: Middle-aged man, not acutely ill appearing, in NAD. HEENT: [place]/AT, anicteric sclera, PERRL, moist mucous membranes. Neuro: Awake, oriented x 4. Responds to verbal stimuli. Following commands consistently. Moves all 4 extremities spontaneously.   CV: RRR, no m/g/r. PULM: Breathing even and unlabored on 3LNC. Lung fields with faint wheezing in bilateral upper fields, L > R. GI: Soft, nontender, nondistended. Normoactive bowel sounds. Extremities: Trace symmetric BLE edema noted. Skin: Warm/dry, no rashes.  Labs/imaging personally reviewed:  Echo 3/12 > EF 55-60% with severe RV dilatation with low to normal RV function CT Chest/A/P 3/13 > This demonstrated moderately large amount of eccentric thrombus in the left main PA with RV/LV + 1.63, bibasilar aspiration R > L with small effusion, anasarca, emphysema.  CTA Chest 3/13 >  Known left main pulmonary embolus with associated right heart strain (RV to LV ratio 1.6), bilateral lower lobe dependent patchy airspace opacities R >L, trace bilateral pleural effusions R > L, Aortic Atherosclerosis (ICD10-I70.0) and Emphysema (ICD10-J43.9).  Assessment & Plan:   Subacute PE with cor pulmonale CT Chest demonstrated large thrombus in L main PA c/f subacute PE. CTA demonstrating known L main PE with evidence of R heart strain (RV/LV Ratio 1.63). Management options including conservative management, lytic administration and thrombectomy discussed with decision for conservative management/heparin  gtt. Patient remains hemodynamically stable without significant tachycardia or hypotension and SpO2 remains > 90 on 3LNC. - Therapeutic anticoagulation  with heparin gtt - Consider IR consult if hemodynamics become unstable or patient becomes more symptomatic - Echo 3/12 with EF 55-60%, flattening of intraventricular septum in systole, severely enlarged RV - F/u trop, BNP - F/u LE Dopplers - Eventual transition to PO anticoagulation once nearing discharge  Acute hypoxic respiratory failure 2/2 above + CHF exacerbation + possible aspiration. Likely only small component COPD exacerbation (presumed based on imaging, no PFT's) + parainfluenza. - Supplemental O2 support as needed - Wean O2 for sat > 90% - Ok to wean from steroids, as symptoms/hypoxia likely most associated with PE - Pulmonary hygiene - Needs PFTs/outpatient pulmonary f/u once recovered from acute illness  Rest per primary team  Best practice (evaluated daily):  Per primary team.  Labs   CBC: Recent Labs  Lab 12/09/22 1503 12/10/22 0445 12/11/22 0024  WBC 5.4 5.6 5.8  NEUTROABS 4.0  --   --   HGB 17.2* 16.1 15.6  HCT 52.5* 48.8 49.3  MCV 96.3 95.7 97.6  PLT 251 244 [id number]   Basic Metabolic Panel: Recent Labs  Lab 12/09/22 1503 12/10/22 0445 12/11/22 0024  NA 136 136 138  K 4.2 4.0 3.7  CL 101 100 97*  CO2 24 28 34*  GLUCOSE 99 151* 138*  BUN 26* 25* 14  CREATININE 1.04 0.93 0.85  CALCIUM 8.7* 8.3* 8.5*   GFR: Estimated Creatinine Clearance: 73.6 mL/min (by C-G formula based on SCr of 0.85 mg/dL). Recent Labs  Lab 12/09/22 1503 12/10/22 0445 12/11/22 0024  WBC 5.4 5.6 5.8   Liver Function Tests: Recent Labs  Lab 12/10/22 0445  AST 54*  ALT 55*  ALKPHOS 110  BILITOT 1.0  PROT 5.6*  ALBUMIN 2.5*   No results for input(s): "LIPASE", "AMYLASE" in the last 168 hours. No results for input(s): "AMMONIA" in the last 168 hours.  ABG:    Component Value Date/Time   HCO3 41.5 (H) 12/11/2022 0800   TCO2 29 09/18/2012 1223   O2SAT 95.2 12/11/2022 0800    Coagulation Profile: No results for input(s): "INR", "PROTIME" in the last 168  hours.  Cardiac Enzymes: No results for input(s): "CKTOTAL", "CKMB", "CKMBINDEX", "TROPONINI" in the last 168 hours.  HbA1C: Hgb A1c MFr Bld  Date/Time Value Ref Range Status  12/01/2019 04:14 PM 6.2 (H) 4.8 - 5.6 % Final    Comment:             Prediabetes: 5.7 - 6.4          Diabetes: >6.4          Glycemic control for adults with diabetes: <7.0   09/02/2018 10:41 AM 5.9 (H) 4.8 - 5.6 % Final    Comment:             Prediabetes: 5.7 - 6.4          Diabetes: >6.4          Glycemic control for adults with diabetes: <7.0    CBG: Recent Labs  Lab 12/10/22 0824 12/11/22 0850 12/11/22 1034 12/11/22 1612  GLUCAP 146* 211* 152* 113*   Review of Systems:   Review of Systems  Constitutional:  Positive for malaise/fatigue and weight loss. Negative for chills, diaphoresis and fever.  HENT:  Positive for congestion. Negative for sore throat.   Respiratory:  Positive for cough, shortness of breath and wheezing. Negative for hemoptysis.  Cardiovascular:  Positive for leg swelling (Bilateral ankles, symmetric). Negative for chest pain, palpitations and claudication.  Gastrointestinal:  Negative for abdominal pain, diarrhea, nausea and vomiting.  Genitourinary:  Negative for dysuria and hematuria.  Skin:  Negative for rash.  Neurological:  Negative for dizziness, weakness and headaches.   Past Medical History:  He,  has a past medical history of Subarachnoid hemorrhage following injury, with loss of consciousness ([place]) (09/18/2012) and Syncope and collapse (09/18/2012).   Surgical History:   Past Surgical History:  Procedure Laterality Date   PERCUTANEOUS PINNING PHALANX FRACTURE OF HAND  1980's   "right" (09/18/2012)    Social History:   reports that he has been smoking cigarettes. He has a 20.00 pack-year smoking history. He has never used smokeless tobacco. He reports that he does not drink alcohol and does not use drugs.   Family History:  His family history includes  Colon cancer in his father; Hypertension in his mother.   Allergies: Allergies  Allergen Reactions   Tdap [Tetanus-Diphth-Acell Pertussis]     Patient reports breaking out in "lesions"    Home Medications: Prior to Admission medications   Not on File    Signature:   Rhae Lerner [facility] 12/11/22 7:26 PM  Please see [email or web address] for pager details.  From 7A-7P if no response, please call [phone] After hours, please call ELink [phone]

## 2022-12-11 NOTE — TOC Progression Note (Signed)
Transition of Care (TOC) - Progression Note    Patient Details  Name: Erik Ward. MRN: [id number] Date of Birth: 1956-02-10  Transition of Care [facility]) CM/SW Contact  Zenon Mayo, RN Phone Number: 12/11/2022, 3:24 PM  Clinical Narrative:    from home with wife, acute resp failure with hypoxia, chronic diarrhea for 2 months, is Stand by ast. TOC following.        Expected Discharge Plan and Services                                               Social Determinants of Health (SDOH) Interventions SDOH Screenings   Food Insecurity: No Food Insecurity (12/10/2022)  Housing: Low Risk  (12/10/2022)  Transportation Needs: No Transportation Needs (12/10/2022)  Utilities: Not At Risk (12/10/2022)  Depression (PHQ2-9): Low Risk  (06/29/2020)  Tobacco Use: High Risk (12/09/2022)    Readmission Risk Interventions     No data to display

## 2022-12-11 NOTE — Consult Note (Signed)
   [facility] CM Inpatient Consult   12/11/2022  Soma Lizak 25-Jul-1956 [id number]  Orientation with Natividad Brood, [facility] Liaison for review.  Location: [facility] Liaison progression rounds [facility]).   [facility]) [place] Patient: Insurance [facility] Medicare)    Primary Care Provider: Antony Blackbird, MD at [facility] and Wellness   Patient screened for potential new heart failure diagnosis as discussed in progressive rounds. Pt noted as low risk score for unplanned readmission for potential [facility]) Care Management services.    Plan: [facility] RN will continue to follow ongoing disposition to assess for post hospital community care coordination/management needs.  Pending disposition community care coordination services.   [facility] does not replace or interfere with any arrangements made by the Inpatient Transition of Care team.   For questions contact:    Raina Mina, RN, [place] Hours M-F 8:00 am to 5 pm [phone] mobile [phone] [Office toll free line][facility] Office Hours are M-F 8:30 - 5 pm 24 hour nurse advise line [phone] Conceirge  [email or web address]

## 2022-12-11 NOTE — Progress Notes (Signed)
ANTICOAGULATION CONSULT NOTE - Initial Consult  Pharmacy Consult:  Heparin Indication: Acute PE  Allergies  Allergen Reactions   Tdap [Tetanus-Diphth-Acell Pertussis]     Patient reports breaking out in "lesions"    Patient Measurements: Height: '5\' 7"'$  (170.2 cm) Weight: 62.6 kg (138 lb) IBW/kg (Calculated) : 66.1 Heparin Dosing Weight: 62 kg  Vital Signs: Temp: 97.6 F (36.4 C) (03/13 1118) Temp Source: Oral (03/13 1118) BP: 103/74 (03/13 1118) Pulse Rate: 78 (03/13 1118)  Labs: Recent Labs    12/09/22 1503 12/09/22 2008 12/10/22 0445 12/11/22 0024  HGB 17.2*  --  16.1 15.6  HCT 52.5*  --  48.8 49.3  PLT 251  --  244 275  CREATININE 1.04  --  0.93 0.85  TROPONINIHS 57* 46*  --   --     Estimated Creatinine Clearance: 73.6 mL/min (by C-G formula based on SCr of 0.85 mg/dL).   Medical History: Past Medical History:  Diagnosis Date   Subarachnoid hemorrhage following injury, with loss of consciousness ([place]) 09/18/2012   syncope and collapse (09/18/2012)   Syncope and collapse 09/18/2012   w/loss of consciousness (09/18/2012)    Assessment: 15 YOM presented with respiratory failure.  CTA showed acute PE with RHS and Pharmacy consulted to dose IV heparin.  CBC stable; no bleeding reported.  Patient received prophylactic Lovenox on 3/13 PM.  Goal of Therapy:  Heparin level 0.3-0.7 units/ml Monitor platelets by anticoagulation protocol: Yes   Plan:  Heparin 4000 units IV bolus, then  Heparin gtt at 1000 units/hr Check 6 hr heparin level Daily heparin level and CBC  Veronika Heard D. Mina Marble, PharmD, BCPS, [place] 12/11/2022, 6:59 PM

## 2022-12-12 ENCOUNTER — Inpatient Hospital Stay (HOSPITAL_COMMUNITY): Payer: 59

## 2022-12-12 ENCOUNTER — Telehealth: Payer: Self-pay | Admitting: Internal Medicine

## 2022-12-12 ENCOUNTER — Other Ambulatory Visit (HOSPITAL_COMMUNITY): Payer: Self-pay

## 2022-12-12 DIAGNOSIS — I2699 Other pulmonary embolism without acute cor pulmonale: Secondary | ICD-10-CM | POA: Diagnosis not present

## 2022-12-12 DIAGNOSIS — I2609 Other pulmonary embolism with acute cor pulmonale: Secondary | ICD-10-CM | POA: Diagnosis not present

## 2022-12-12 DIAGNOSIS — I5023 Acute on chronic systolic (congestive) heart failure: Secondary | ICD-10-CM | POA: Diagnosis not present

## 2022-12-12 DIAGNOSIS — R0602 Shortness of breath: Secondary | ICD-10-CM

## 2022-12-12 DIAGNOSIS — Z86711 Personal history of pulmonary embolism: Secondary | ICD-10-CM

## 2022-12-12 DIAGNOSIS — J9601 Acute respiratory failure with hypoxia: Secondary | ICD-10-CM | POA: Diagnosis not present

## 2022-12-12 DIAGNOSIS — M7989 Other specified soft tissue disorders: Secondary | ICD-10-CM | POA: Diagnosis not present

## 2022-12-12 DIAGNOSIS — I509 Heart failure, unspecified: Secondary | ICD-10-CM

## 2022-12-12 DIAGNOSIS — I2602 Saddle embolus of pulmonary artery with acute cor pulmonale: Secondary | ICD-10-CM

## 2022-12-12 DIAGNOSIS — B348 Other viral infections of unspecified site: Secondary | ICD-10-CM | POA: Diagnosis not present

## 2022-12-12 LAB — BASIC METABOLIC PANEL
Anion gap: 5 (ref 5–15)
BUN: 11 mg/dL (ref 8–23)
CO2: 33 mmol/L — ABNORMAL HIGH (ref 22–32)
Calcium: 8.4 mg/dL — ABNORMAL LOW (ref 8.9–10.3)
Chloride: 97 mmol/L — ABNORMAL LOW (ref 98–111)
Creatinine, Ser: 0.88 mg/dL (ref 0.61–1.24)
GFR, Estimated: >60 mL/min (ref >60)
Glucose, Bld: 105 mg/dL — ABNORMAL HIGH (ref 70–99)
Potassium: 4.4 mmol/L (ref 3.5–5.1)
Sodium: 135 mmol/L (ref 135–145)

## 2022-12-12 LAB — LACTIC ACID, PLASMA
Lactic Acid, Venous: 1.4 mmol/L (ref 0.5–1.9)
Lactic Acid, Venous: 1.6 mmol/L (ref 0.5–1.9)

## 2022-12-12 LAB — GASTROINTESTINAL PANEL BY PCR, STOOL (REPLACES STOOL CULTURE)

## 2022-12-12 LAB — CBC
HCT: 48.8 % (ref 39.0–52.0)
Hemoglobin: 15.4 g/dL (ref 13.0–17.0)
MCH: 31.4 pg (ref 26.0–34.0)
MCHC: 31.6 g/dL (ref 30.0–36.0)
MCV: 99.6 fL (ref 80.0–100.0)
Platelets: 270 10*3/uL (ref 150–400)
RBC: 4.9 MIL/uL (ref 4.22–5.81)
RDW: 14.6 % (ref 11.5–15.5)
WBC: 4.9 10*3/uL (ref 4.0–10.5)
nRBC: 0 % (ref 0.0–0.2)

## 2022-12-12 LAB — HEPARIN LEVEL (UNFRACTIONATED)
Heparin Unfractionated: 0.37 IU/mL (ref 0.30–0.70)
Heparin Unfractionated: 0.34 IU/mL (ref 0.30–0.70)

## 2022-12-12 LAB — GLIA (IGA/G) + TTG IGA
Antigliadin Abs, IgA: 5 units (ref 0–19)
Gliadin IgG: 2 units (ref 0–19)
Tissue Transglutaminase Ab, IgA: <2 U/mL (ref 0–3)

## 2022-12-12 LAB — GLUCOSE, CAPILLARY
Glucose-Capillary: 154 mg/dL — ABNORMAL HIGH (ref 70–99)
Glucose-Capillary: 102 mg/dL — ABNORMAL HIGH (ref 70–99)

## 2022-12-12 LAB — PSA: Prostatic Specific Antigen: 1.78 ng/mL (ref 0.00–4.00)

## 2022-12-12 LAB — ABO/RH: ABO/RH(D): B POS

## 2022-12-12 MED ORDER — RIVAROXABAN 15 MG PO TABS
15.0000 mg | ORAL_TABLET | Freq: Two times a day (BID) | ORAL | Status: AC
  Administered 2022-12-12 (×2): 15 mg via ORAL
  Filled 2022-12-12 (×2): qty 1

## 2022-12-12 MED ORDER — RIVAROXABAN 20 MG PO TABS: 20.0000 mg | ORAL_TABLET | Freq: Every day | ORAL | Status: DC

## 2022-12-12 MED ORDER — RIVAROXABAN 15 MG PO TABS
15.0000 mg | ORAL_TABLET | Freq: Two times a day (BID) | ORAL | Status: DC
  Administered 2022-12-13 (×2): 15 mg via ORAL
  Filled 2022-12-12 (×2): qty 1

## 2022-12-12 MED ORDER — RIVAROXABAN 15 MG PO TABS: 15.0000 mg | ORAL_TABLET | Freq: Two times a day (BID) | ORAL | Status: DC

## 2022-12-12 NOTE — Progress Notes (Signed)
VASCULAR LAB    Bilateral lower extremity venous duplex has been performed.  See CV proc for preliminary results.   Gamble Enderle, RVT 12/12/2022, 10:43 AM

## 2022-12-12 NOTE — Progress Notes (Signed)
SATURATION QUALIFICATIONS: (This note is used to comply with regulatory documentation for home oxygen)  Patient Saturations on Room Air at Rest = 89%  Patient Saturations on Room Air while Ambulating = 79%  Patient Saturations on 6 Liters of oxygen while Ambulating = 92%  Please briefly explain why patient needs home oxygen:

## 2022-12-12 NOTE — Progress Notes (Signed)
DAILY PROGRESS NOTE   Patient Name: Akzel Ward. Date of Encounter: 12/12/2022 Cardiologist: Pixie Casino, MD  Chief Complaint   Breathing better  Patient Profile   67 yo male with shortness of breath, found to have probable acute right heart failure   Subjective   Found to have large PE thrombus burden yesterday by CT, although has been hemodynamically stable and tolerating well. Now on IV heparin. Slightly net negative. Creatinine stable today, currently off diuretics.   Objective   Vitals:   12/12/22 0041 12/12/22 0429 12/12/22 0720 12/12/22 0816  BP: 104/75 99/72 105/73   Pulse: 72 77 65   Resp: '19 19 18   '$ Temp: 98.1 F (36.7 C) 98.1 F (36.7 C) 97.6 F (36.4 C)   TempSrc: Oral Oral Oral   SpO2: 90% 91% 93% 93%  Weight: 62.1 kg     Height:        Intake/Output Summary (Last 24 hours) at 12/12/2022 1019 Last data filed at 12/12/2022 0601 Gross per 24 hour  Intake 775.03 ml  Output 1350 ml  Net -574.97 ml   Filed Weights   12/10/22 1341 12/11/22 0338 12/12/22 0041  Weight: 60 kg 62.6 kg 62.1 kg    Physical Exam   General appearance: alert and no distress Neck: no carotid bruit, supple, symmetrical, trachea midline, and thyroid not enlarged, symmetric, no tenderness/mass/nodules Lungs: diminished breath sounds bibasilar Heart: regular rate and rhythm Extremities: extremities normal, atraumatic, no cyanosis or edema Neurologic: Grossly normal  Inpatient Medications    Scheduled Meds:  insulin aspart  0-9 Units Subcutaneous TID WC   predniSONE  40 mg Oral Q2000   umeclidinium-vilanterol  1 puff Inhalation Daily    Continuous Infusions:  heparin 1,000 Units/hr (12/12/22 0601)    PRN Meds: acetaminophen **OR** acetaminophen, ipratropium-albuterol   Labs   Results for orders placed or performed during the hospital encounter of 12/09/22 (from the past 48 hour(s))  Occult blood card to lab, stool     Status: None   Collection Time:  12/10/22  5:35 PM  Result Value Ref Range   Fecal Occult Bld NEGATIVE NEGATIVE    Comment: Performed at [facility], [address] [place], [place] [address]  CBC     Status: None   Collection Time: 12/11/22 12:24 AM  Result Value Ref Range   WBC 5.8 4.0 - 10.5 K/uL   RBC 5.05 4.22 - 5.81 MIL/uL   Hemoglobin 15.6 13.0 - 17.0 g/dL   HCT 49.3 39.0 - 52.0 %   MCV 97.6 80.0 - 100.0 fL   MCH 30.9 26.0 - 34.0 pg   MCHC 31.6 30.0 - 36.0 g/dL   RDW 14.6 11.5 - 15.5 %   Platelets 275 150 - 400 K/uL   nRBC 0.0 0.0 - 0.2 %    Comment: Performed at [facility], [place] [address] [place],  [id number]  Basic metabolic panel     Status: Abnormal   Collection Time: 12/11/22 12:24 AM  Result Value Ref Range   Sodium 138 135 - 145 mmol/L   Potassium 3.7 3.5 - 5.1 mmol/L   Chloride 97 (L) 98 - 111 mmol/L   CO2 34 (H) 22 - 32 mmol/L   Glucose, Bld 138 (H) 70 - 99 mg/dL    Comment: Glucose reference range applies only to samples taken after fasting for at least 8 hours.   BUN 14 8 - 23 mg/dL   Creatinine, Ser  0.85 0.61 - 1.24 mg/dL   Calcium 8.5 (L) 8.9 - 10.3 mg/dL   GFR, Estimated >60 >60 mL/min    Comment: (NOTE) Calculated using the CKD-EPI Creatinine Equation (2021)    Anion gap 7 5 - 15    Comment: Performed at [facility] [address], [place], [place] [address]  PSA     Status: None   Collection Time: 12/11/22  7:53 AM  Result Value Ref Range   Prostatic Specific Antigen 1.78 0.00 - 4.00 ng/mL    Comment: (NOTE) While PSA levels of <=4.00 ng/ml are reported as reference range, some men with levels below 4.00 ng/ml can have prostate cancer and many men with PSA above 4.00 ng/ml do not have prostate cancer.  Other tests such as free PSA, age specific reference ranges, PSA velocity and PSA doubling time may be helpful especially in men less than 68 years old. Performed at [facility], [place] [address], [place], [place] [address]   Blood gas, venous      Status: Abnormal   Collection Time: 12/11/22  8:00 AM  Result Value Ref Range   pH, Ven 7.4 7.25 - 7.43   pCO2, Ven 67 (H) 44 - 60 mmHg   pO2, Ven 76 (H) 32 - 45 mmHg   Bicarbonate 41.5 (H) 20.0 - 28.0 mmol/L   Acid-Base Excess 13.5 (H) 0.0 - 2.0 mmol/L   O2 Saturation 95.2 %   Patient temperature 37.0    Collection site VENOUS    Drawn by [id number]     Comment: Performed at [facility] [address] [place], [place] [address]  Glucose, capillary     Status: Abnormal   Collection Time: 12/11/22  8:50 AM  Result Value Ref Range   Glucose-Capillary 211 (H) 70 - 99 mg/dL    Comment: Glucose reference range applies only to samples taken after fasting for at least 8 hours.  Glucose, capillary     Status: Abnormal   Collection Time: 12/11/22 10:34 AM  Result Value Ref Range   Glucose-Capillary 152 (H) 70 - 99 mg/dL    Comment: Glucose reference range applies only to samples taken after fasting for at least 8 hours.  Glucose, capillary     Status: Abnormal   Collection Time: 12/11/22  4:12 PM  Result Value Ref Range   Glucose-Capillary 113 (H) 70 - 99 mg/dL    Comment: Glucose reference range applies only to samples taken after fasting for at least 8 hours.  Troponin I (High Sensitivity)     Status: Abnormal   Collection Time: 12/11/22  7:03 PM  Result Value Ref Range   Troponin I (High Sensitivity) 22 (H) <18 ng/L    Comment: DELTA CHECK NOTED (NOTE) Elevated high sensitivity troponin I (hsTnI) values and significant  changes across serial measurements may suggest ACS but many other  chronic and acute conditions are known to elevate hsTnI results.  Refer to the "Links" section for chest pain algorithms and additional  guidance. Performed at [facility], [place] [address] [place], [place] [address]   Brain natriuretic peptide     Status: Abnormal   Collection Time: 12/11/22  7:03 PM  Result Value Ref Range   B Natriuretic Peptide 651.9 (H) 0.0 - 100.0 pg/mL     Comment: Performed at [facility] [address] [place], [place] [address]  Lactic acid, plasma     Status: Abnormal   Collection Time:  12/11/22  7:03 PM  Result Value Ref Range   Lactic Acid, Venous 2.3 (HH) 0.5 - 1.9 mmol/L    Comment: CRITICAL RESULT CALLED TO, READ BACK BY AND VERIFIED WITH  A  REDMAN,RN 2030 12/11/2022 WBOND Performed at [facility], [address], [place], [place] [address]   Troponin I (High Sensitivity)     Status: Abnormal   Collection Time: 12/11/22  8:30 PM  Result Value Ref Range   Troponin I (High Sensitivity) 21 (H) <18 ng/L    Comment: (NOTE) Elevated high sensitivity troponin I (hsTnI) values and significant  changes across serial measurements may suggest ACS but many other  chronic and acute conditions are known to elevate hsTnI results.  Refer to the "Links" section for chest pain algorithms and additional  guidance. Performed at [facility], [place] [address], [place], [place] [address]   Glucose, capillary     Status: Abnormal   Collection Time: 12/11/22  8:54 PM  Result Value Ref Range   Glucose-Capillary 168 (H) 70 - 99 mg/dL    Comment: Glucose reference range applies only to samples taken after fasting for at least 8 hours.  Lactic acid, plasma     Status: None   Collection Time: 12/11/22 11:47 PM  Result Value Ref Range   Lactic Acid, Venous 1.4 0.5 - 1.9 mmol/L    Comment: Performed at [facility], [place] [address] [place], [place] [id number]  Basic metabolic panel     Status: Abnormal   Collection Time: 12/12/22 12:38 AM  Result Value Ref Range   Sodium 135 135 - 145 mmol/L   Potassium 4.4 3.5 - 5.1 mmol/L   Chloride 97 (L) 98 - 111 mmol/L   CO2 33 (H) 22 - 32 mmol/L   Glucose, Bld 105 (H) 70 - 99 mg/dL    Comment: Glucose reference range applies only to samples taken after fasting for at least 8 hours.   BUN 11 8 - 23 mg/dL   Creatinine, Ser 0.88 0.61 - 1.24 mg/dL   Calcium 8.4 (L) 8.9 - 10.3 mg/dL    GFR, Estimated >60 >60 mL/min    Comment: (NOTE) Calculated using the CKD-EPI Creatinine Equation (2021)    Anion gap 5 5 - 15    Comment: Performed at [facility] [address] [place], [place] [address]  CBC     Status: None   Collection Time: 12/12/22 12:38 AM  Result Value Ref Range   WBC 4.9 4.0 - 10.5 K/uL   RBC 4.90 4.22 - 5.81 MIL/uL   Hemoglobin 15.4 13.0 - 17.0 g/dL   HCT 48.8 39.0 - 52.0 %   MCV 99.6 80.0 - 100.0 fL   MCH 31.4 26.0 - 34.0 pg   MCHC 31.6 30.0 - 36.0 g/dL   RDW 14.6 11.5 - 15.5 %   Platelets 270 150 - 400 K/uL   nRBC 0.0 0.0 - 0.2 %    Comment: Performed at [facility], [place] [address] [place], [place] [address]  Type and screen [place]     Status: None   Collection Time: 12/12/22 12:38 AM  Result Value Ref Range   ABO/RH(D) B POS    Antibody Screen NEG    Sample Expiration      12/15/2022,2359 Performed at [facility], [place] [address] [place], [place] [address]   Heparin level (unfractionated)     Status: None  Collection Time: 12/12/22  2:10 AM  Result Value Ref Range   Heparin Unfractionated 0.37 0.30 - 0.70 IU/mL    Comment: (NOTE) The clinical reportable range upper limit is being lowered to >1.10 to align with the FDA approved guidance for the current laboratory assay.  If heparin results are below expected values, and patient dosage has  been confirmed, suggest follow up testing of antithrombin III levels. Performed at [facility], [place] [address], [place], [place] [address]   Lactic acid, plasma     Status: None   Collection Time: 12/12/22  2:10 AM  Result Value Ref Range   Lactic Acid, Venous 1.6 0.5 - 1.9 mmol/L    Comment: Performed at [facility] [address] [place], [place] [address]  ABO/Rh     Status: None   Collection Time: 12/12/22  2:10 AM  Result Value Ref Range   ABO/RH(D)      B POS Performed at [facility] [address], [place], [place]  [address]   Glucose, capillary     Status: Abnormal   Collection Time: 12/12/22  6:06 AM  Result Value Ref Range   Glucose-Capillary 154 (H) 70 - 99 mg/dL    Comment: Glucose reference range applies only to samples taken after fasting for at least 8 hours.    ECG   N/A  Telemetry   Sinus rhythm with RBBB - Personally Reviewed  Radiology    CT Angio Chest Pulmonary Embolism (PE) W or WO Contrast  Result Date: 12/11/2022 CLINICAL DATA:  Dyspnea, chronic, chest wall or pleura disease suspected EXAM: CT ANGIOGRAPHY CHEST WITH CONTRAST TECHNIQUE: Multidetector CT imaging of the chest was performed using the standard protocol during bolus administration of intravenous contrast. Multiplanar CT image reconstructions and MIPs were obtained to evaluate the vascular anatomy. RADIATION DOSE REDUCTION: This exam was performed according to the departmental dose-optimization program which includes automated exposure control, adjustment of the mA and/or kV according to patient size and/or use of iterative reconstruction technique. CONTRAST:  83m OMNIPAQUE IOHEXOL 350 MG/ML SOLN COMPARISON:  None Available. FINDINGS: Cardiovascular: Satisfactory opacification of the pulmonary arteries to the segmental level. Redemonstration of left main pulmonary artery filling defect extending to the left lower lobe segmental pulmonary arteries. Enlarged right to left ventricular ratio (1.6). Enlarged right atria. no significant pericardial effusion. The thoracic aorta is normal in caliber. Mild atherosclerotic plaque of the thoracic aorta. No coronary artery calcifications. Mediastinum/Nodes: No enlarged mediastinal, hilar, or axillary lymph nodes. Thyroid gland, trachea, and esophagus demonstrate no significant findings. Lungs/Pleura: Severe emphysematous changes most prominent along the upper lobes. Diffuse mild bronchial wall thickening. Bilateral lower lobe dependent patchy airspace opacities, right greater than left. No  pulmonary nodule. No pulmonary mass. Trace bilateral pleural effusions, right greater than left. No pneumothorax. Upper Abdomen: Excretion of previously administered intravenous contrast noted. Please see separately dictated CT chest, abdomen, pelvis 12/11/2022. Musculoskeletal: No chest wall abnormality. No suspicious lytic or blastic osseous lesions. No acute displaced fracture. Review of the MIP images confirms the above findings. IMPRESSION: 1. Known left main pulmonary embolus with associated right heart strain (RV to LV ratio 1.6). No pulmonary infarction. 2. Bilateral lower lobe dependent patchy airspace opacities, right greater than left. 3. Trace bilateral pleural effusions, right greater than left. 4. Aortic Atherosclerosis (ICD10-I70.0) and Emphysema (ICD10-J43.9). Electronically Signed   By: MIven FinnM.D.   On: 12/11/2022 20:23   CT CHEST ABDOMEN PELVIS W CONTRAST  Addendum  Date: 12/11/2022   ADDENDUM REPORT: 12/11/2022 18:34 ADDENDUM: Critical Value/emergent results were called by telephone at the time of interpretation on 12/11/2022 at 6:34 pm to provider CAROLYN GUILLOUD , who verbally acknowledged these results. Electronically Signed   By: Zetta Bills M.D.   On: 12/11/2022 18:34   Result Date: 12/11/2022 CLINICAL DATA:  Chronic diarrhea in a 67 year old male. EXAM: CT CHEST, ABDOMEN, AND PELVIS WITH CONTRAST TECHNIQUE: Multidetector CT imaging of the chest, abdomen and pelvis was performed following the standard protocol during bolus administration of intravenous contrast. RADIATION DOSE REDUCTION: This exam was performed according to the departmental dose-optimization program which includes automated exposure control, adjustment of the mA and/or kV according to patient size and/or use of iterative reconstruction technique. CONTRAST:  68m OMNIPAQUE IOHEXOL 350 MG/ML SOLN COMPARISON:  None available. FINDINGS: CT CHEST FINDINGS Cardiovascular: Eccentric thrombus in the LEFT main  pulmonary artery (image 28/3) this is along the posterior aspect of the pulmonary artery. No signs of RIGHT pulmonary arterial thrombus. Marked increased RV to LV ratio at 1.63. No pericardial effusion. Aorta is nondilated. Calcified and noncalcified plaque in the thoracic aorta without signs of aneurysm. Mediastinum/Nodes: No thoracic inlet, axillary, mediastinal or hilar adenopathy. Esophagus grossly normal. Lungs/Pleura: Marked pulmonary emphysema worse at the lung apices. No pneumothorax. Material in lower lobe bronchi bilaterally with bronchial wall thickening that is greatest on the RIGHT suspicious for aspiration related changes. Small area of consolidation in the RIGHT lung base. Small RIGHT-sided pleural effusion a trace LEFT-sided pleural fluid. Musculoskeletal: No chest wall mass. See below for full musculoskeletal details. CT ABDOMEN PELVIS FINDINGS Hepatobiliary: Limited assessment of the liver due to bolus timing which is essentially in early arterial phase when the bolus reaches the abdomen likely related to cardiac dysfunction. No pericholecystic stranding. No biliary duct dilation. Pancreas: Normal, without mass, inflammation or ductal dilatation. Spleen: Normal. Adrenals/Urinary Tract: Adrenal glands are normal. Symmetric renal enhancement without signs of hydronephrosis. Large renal sinus cyst on the RIGHT without suspicious features splays lower pole collecting systems. No dedicated imaging follow-up in the absence referable symptoms is suggested for this finding. No suspicious renal lesion. No ureteral dilation. No substantial perivesical stranding though there is mild bladder wall thickening which is circumferential and suggested in spite of mild bladder distension. Stomach/Bowel: No signs of bowel obstruction. Query gastric thickening in perigastric stranding or edema though there is generalized edema throughout the abdomen and pelvis and within the body wall. Oral contrast media has reached  the distal colon. Vascular/Lymphatic: No substantial atherosclerotic change. Patent abdominal aorta and its branches. No adenopathy in the abdomen. No adenopathy in the pelvis. Reproductive: Unremarkable to the extent evaluated. Other: Body wall edema. Small volume ascites in the pelvis. Presacral edema as well. Musculoskeletal: Body wall edema. No acute bone finding. No destructive bone process. Spinal degenerative changes. IMPRESSION: 1. Moderately large amount of eccentric thrombus in the LEFT main pulmonary artery. Findings suggest subacute or recent pulmonary embolism associated with massive dilation of the RIGHT heart and RIGHT heart strain. Positive for acute PE with CT evidence of right heart strain (RV/LV Ratio = 1.63) consistent with at least submassive (intermediate risk) PE. The presence of right heart strain has been associated with an increased risk of morbidity and mortality. Please refer to the "Code PE Focused" order set in EPIC. 2. Bibasilar aspiration worse on the RIGHT and associated with small effusions also worse on the RIGHT. 3. Small RIGHT-sided pleural effusion a trace LEFT-sided pleural fluid. 4. Query gastritis  versus under distension. 5. Mild thickening of the urinary bladder raising the question of cystitis. 6. Signs of anasarca. 7. Pulmonary emphysema and aortic atherosclerosis. Aortic Atherosclerosis (ICD10-I70.0) and Emphysema (ICD10-J43.9). A call is out to the referring provider to further discuss findings in the above case. Electronically Signed: By: Zetta Bills M.D. On: 12/11/2022 18:23   ECHOCARDIOGRAM COMPLETE  Result Date: 12/10/2022    ECHOCARDIOGRAM REPORT   Patient Name:   Ashawn Ortegon. Date of Exam: 12/10/2022 Medical Rec #:  [id number]           Height:       67.0 in Accession #:    [id number]          Weight:       150.0 lb Date of Birth:  [birth year]           BSA:          1.790 m Patient Age:    73 years            BP:           100/75 mmHg Patient Gender: M                    HR:           80 bpm. Exam Location:  Inpatient Procedure: 2D Echo, Cardiac Doppler and Color Doppler Indications:    respiratory distress  History:        Patient has prior history of Echocardiogram examinations. Risk                 Factors:Current Smoker.  Sonographer:    Danne Baxter RDCS, FE, PE Referring Phys: [id number] Velna Ochs  Sonographer Comments: Technically difficult study due to poor echo windows. IMPRESSIONS  1. Left ventricular ejection fraction, by estimation, is 55 to 60%. The left ventricle has normal function. The left ventricle has no regional wall motion abnormalities. Left ventricular diastolic parameters are indeterminate. There is the interventricular septum is flattened in systole, consistent with right ventricular pressure overload.  2. Right ventricular systolic function is normal. The right ventricular size is severely enlarged. There is normal pulmonary artery systolic pressure, but this may due to equalization of pressures. Study suggestive of Cor Pulmonale.  3. Left atrial size was severely dilated.  4. The mitral valve is grossly normal. No evidence of mitral valve regurgitation.  5. The aortic valve was not well visualized. Aortic valve regurgitation is not visualized.  6. The inferior vena cava is dilated in size with <50% respiratory variability, suggesting right atrial pressure of 15 mmHg. Comparison(s): No prior Echocardiogram. FINDINGS  Left Ventricle: Left ventricular ejection fraction, by estimation, is 55 to 60%. The left ventricle has normal function. The left ventricle has no regional wall motion abnormalities. The left ventricular internal cavity size was normal in size. There is  no left ventricular hypertrophy. The interventricular septum is flattened in systole, consistent with right ventricular pressure overload. Left ventricular diastolic parameters are indeterminate. Right Ventricle: The right ventricular size is severely enlarged. No increase  in right ventricular wall thickness. Right ventricular systolic function is normal. There is normal pulmonary artery systolic pressure. The tricuspid regurgitant velocity is 2.16 m/s, and with an assumed right atrial pressure of 15 mmHg, the estimated right ventricular systolic pressure is [id number] mmHg. Left Atrium: Left atrial size was severely dilated. Right Atrium: Right atrial size was normal in size. Pericardium: Trivial pericardial effusion is present. The pericardial effusion is anterior to the  right ventricle. Mitral Valve: The mitral valve is grossly normal. No evidence of mitral valve regurgitation. Tricuspid Valve: The tricuspid valve is normal in structure. Tricuspid valve regurgitation is mild. Aortic Valve: The aortic valve was not well visualized. Aortic valve regurgitation is not visualized. Pulmonic Valve: The pulmonic valve was not well visualized. Pulmonic valve regurgitation is trivial. Aorta: The aortic root is normal in size and structure. Venous: The inferior vena cava is dilated in size with less than 50% respiratory variability, suggesting right atrial pressure of 15 mmHg. IAS/Shunts: No atrial level shunt detected by color flow Doppler.  LEFT VENTRICLE PLAX 2D LVIDd:         4.00 cm   Diastology LVIDs:         2.40 cm   LV e' medial:    4.51 cm/s LV PW:         1.00 cm   LV E/e' medial:  11.6 LV IVS:        0.80 cm   LV e' lateral:   7.15 cm/s LVOT diam:     1.60 cm   LV E/e' lateral: 7.3 LV SV:         25 LV SV Index:   14 LVOT Area:     2.01 cm  RIGHT VENTRICLE RV S prime:     10.60 cm/s TAPSE (M-mode): 1.8 cm LEFT ATRIUM           Index        RIGHT ATRIUM           Index LA diam:      3.30 cm 1.84 cm/m   RA Area:     31.90 cm LA Vol (A4C): 44.2 ml 24.70 ml/m  RA Volume:   141.00 ml 78.79 ml/m  AORTIC VALVE LVOT Vmax:   96.70 cm/s LVOT Vmean:  53.800 cm/s LVOT VTI:    0.124 m  AORTA Ao Root diam: 3.30 cm MITRAL VALVE               TRICUSPID VALVE MV Area (PHT): 3.63 cm    TR Peak  grad:   18.7 mmHg MV Decel Time: 209 msec    TR Vmax:        216.00 cm/s MV E velocity: 52.50 cm/s MV A velocity: 70.00 cm/s  SHUNTS MV E/A ratio:  0.75        Systemic VTI:  0.12 m                            Systemic Diam: 1.60 cm Rudean Haskell MD Electronically signed by Rudean Haskell MD Signature Date/Time: 12/10/2022/12:45:47 PM    Final     Cardiac Studies   Echo above  Assessment   Principal Problem:   Pulmonary embolism (HCC) Active Problems:   Tobacco use disorder   Acute respiratory failure with hypoxia (HCC)   Diarrhea   Unintentional weight loss   Acute hypoxic respiratory failure ([place])   Plan   Appears euvolemic today - creatinine stable. Found to have PE/thrombus burden. Suspect this is acute on chronic as it is hemodynamically well-tolerated. He may auto-diurese if he has improved right heart output as thrombus burden dissolves. Hold lasix today and monitor weight, I's/O's. Venous dopplers in progress.  Time Spent Directly with Patient:  I have spent a total of 25 minutes with the patient reviewing hospital notes, telemetry, EKGs, labs and examining the patient as well as establishing an assessment  and plan that was discussed personally with the patient.  > 50% of time was spent in direct patient care.  Length of Stay:  LOS: 2 days   Pixie Casino, MD, [facility], [place] Director of the Advanced Lipid Disorders &  Cardiovascular Risk Reduction Clinic Diplomate of the American Board of Clinical Lipidology Attending Cardiologist  Direct Dial: [phone]  Fax: [phone]  Website:  [email or web address] [email or web address] 12/12/2022, 10:19 AM

## 2022-12-12 NOTE — Progress Notes (Addendum)
HD#2 Subjective:   Summary: Erik Petite. is a 67 y.o. male with a history of subarachnoid hemorrhage presenting to the hospital with concerns of shortness of breath, diarrhea, and fatigue.  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  Overnight Events: No overnight events  Patient examined at bedside this morning with family at bedside.  Patient wife at bedside and son at bedside.  Patient states that shortness of breath is improving.  He states he has been up and walking to the bathroom, feels fine.  He denies any chest pain.  He denies any other concerns this morning.  Objective:  Vital signs in last 24 hours: Vitals:   12/12/22 0041 12/12/22 0429 12/12/22 0720 12/12/22 0816  BP: 104/75 99/72 105/73   Pulse: 72 77 65   Resp: '19 19 18   '$ Temp: 98.1 F (36.7 C) 98.1 F (36.7 C) 97.6 F (36.4 C)   TempSrc: Oral Oral Oral   SpO2: 90% 91% 93% 93%  Weight: 62.1 kg     Height:       Supplemental O2: Nasal Cannula SpO2: 93 % O2 Flow Rate (L/min): 3.5 L/min   Physical Exam:  Constitutional: Patient resting in bed, no acute distress, nasal cannula in place HENT: normocephalic atraumatic Eyes: conjunctiva non-erythematous Cardiovascular: regular rate and rhythm, no m/r/g Pulmonary/Chest: No increased work of breathing noted.  There are decreased breath sounds noted throughout.  No wheezing, rales, rhonchi is appreciated.  Patient on 3.5 L nasal cannula Abdomen: No abdominal tenderness appreciated Extremities: Trace edema noted bilaterally  Filed Weights   12/10/22 1341 12/11/22 0338 12/12/22 0041  Weight: 60 kg 62.6 kg 62.1 kg     Intake/Output Summary (Last 24 hours) at 12/12/2022 1125 Last data filed at 12/12/2022 0601 Gross per 24 hour  Intake 775.03 ml  Output 1350 ml  Net -574.97 ml   Net IO Since Admission: -1,258.97 mL [12/12/22  1125]  Pertinent Labs:    Latest Ref Rng & Units 12/12/2022   12:38 AM 12/11/2022   12:24 AM 12/10/2022    4:45 AM  CBC  WBC 4.0 - 10.5 K/uL 4.9  5.8  5.6   Hemoglobin 13.0 - 17.0 g/dL 15.4  15.6  16.1   Hematocrit 39.0 - 52.0 % 48.8  49.3  48.8   Platelets 150 - 400 K/uL 270  275  244        Latest Ref Rng & Units 12/12/2022   12:38 AM 12/11/2022   12:24 AM 12/10/2022    4:45 AM  CMP  Glucose 70 - 99 mg/dL 105  138  151   BUN 8 - 23 mg/dL '11  14  25   '$ Creatinine 0.61 - 1.24 mg/dL 0.88  0.85  0.93   Sodium 135 - 145 mmol/L 135  138  136   Potassium 3.5 - 5.1 mmol/L 4.4  3.7  4.0   Chloride 98 - 111 mmol/L 97  97  100   CO2 22 - 32 mmol/L 33  34  28   Calcium 8.9 - 10.3 mg/dL 8.4  8.5  8.3   Total Protein 6.5 - 8.1 g/dL   5.6   Total Bilirubin 0.3 - 1.2 mg/dL   1.0   Alkaline Phos 38 - 126 U/L   110   AST 15 - 41 U/L   54   ALT 0 - 44 U/L  38     Imaging: VAS [place] LOWER EXTREMITY VENOUS (DVT)  Result Date: 12/12/2022  Lower Venous DVT Study Patient Name:  Erik Whitehall.  Date of Exam:   12/12/2022 Medical Rec #: [id number]            Accession #:    [id number] Date of Birth: 1956-07-22            Patient Gender: M Patient Age:   24 years Exam Location:  [place] Procedure:      VAS [place] LOWER EXTREMITY VENOUS (DVT) Referring Phys: Montey Hora --------------------------------------------------------------------------------  Indications: Swelling, SOB, and subacute pulmonary embolism.  Risk Factors: Acute right heart failure. Comparison Study: No prior study on file Performing Technologist: Sharion Dove RVS  Examination Guidelines: A complete evaluation includes B-mode imaging, spectral Doppler, color Doppler, and power Doppler as needed of all accessible portions of each vessel. Bilateral testing is considered an integral part of a complete examination. Limited examinations for reoccurring indications may be performed as noted. The reflux portion of the exam is performed with the  patient in reverse Trendelenburg.  +--------+---------------+---------+-----------+----------------+-------------+ RIGHT   CompressibilityPhasicitySpontaneityProperties      Thrombus                                                                 Aging         +--------+---------------+---------+-----------+----------------+-------------+ CFV     Full                               pulsatile                                                                waveforms                     +--------+---------------+---------+-----------+----------------+-------------+ SFJ     Full                                                             +--------+---------------+---------+-----------+----------------+-------------+ FV Prox Full                                                             +--------+---------------+---------+-----------+----------------+-------------+ FV Mid  Full                                                             +--------+---------------+---------+-----------+----------------+-------------+ FV      Full  Distal                                                                   +--------+---------------+---------+-----------+----------------+-------------+ PFV     Full                                                             +--------+---------------+---------+-----------+----------------+-------------+ POP     Full                               pulsatile                                                                waveforms                     +--------+---------------+---------+-----------+----------------+-------------+ PTV     Full                                                             +--------+---------------+---------+-----------+----------------+-------------+ PERO    Full                                                              +--------+---------------+---------+-----------+----------------+-------------+   +--------+---------------+---------+-----------+----------------+-------------+ LEFT    CompressibilityPhasicitySpontaneityProperties      Thrombus                                                                 Aging         +--------+---------------+---------+-----------+----------------+-------------+ CFV     Full                               pulsatile                                                                waveforms                     +--------+---------------+---------+-----------+----------------+-------------+  SFJ     Full                                                             +--------+---------------+---------+-----------+----------------+-------------+ FV Prox Full                                                             +--------+---------------+---------+-----------+----------------+-------------+ FV Mid  Full                               pulsatile                                                                waveforms                     +--------+---------------+---------+-----------+----------------+-------------+ FV      Full                                                             Distal                                                                   +--------+---------------+---------+-----------+----------------+-------------+ PFV     Full                                                             +--------+---------------+---------+-----------+----------------+-------------+ POP     Full                               pulsatile                                                                waveforms                     +--------+---------------+---------+-----------+----------------+-------------+ PTV     Full                                                              +--------+---------------+---------+-----------+----------------+-------------+  PERO    Full                                                             +--------+---------------+---------+-----------+----------------+-------------+     Summary: BILATERAL: - No evidence of deep vein thrombosis seen in the lower extremities, bilaterally. -No evidence of popliteal cyst, bilaterally. RIGHT: pulsatile waveforms suggestive of fluid overload  LEFT: Pulsatile waveforms suggestive of fluid overload.  *See table(s) above for measurements and observations.    Preliminary    CT Angio Chest Pulmonary Embolism (PE) W or WO Contrast  Result Date: 12/11/2022 CLINICAL DATA:  Dyspnea, chronic, chest wall or pleura disease suspected EXAM: CT ANGIOGRAPHY CHEST WITH CONTRAST TECHNIQUE: Multidetector CT imaging of the chest was performed using the standard protocol during bolus administration of intravenous contrast. Multiplanar CT image reconstructions and MIPs were obtained to evaluate the vascular anatomy. RADIATION DOSE REDUCTION: This exam was performed according to the departmental dose-optimization program which includes automated exposure control, adjustment of the mA and/or kV according to patient size and/or use of iterative reconstruction technique. CONTRAST:  33m OMNIPAQUE IOHEXOL 350 MG/ML SOLN COMPARISON:  None Available. FINDINGS: Cardiovascular: Satisfactory opacification of the pulmonary arteries to the segmental level. Redemonstration of left main pulmonary artery filling defect extending to the left lower lobe segmental pulmonary arteries. Enlarged right to left ventricular ratio (1.6). Enlarged right atria. no significant pericardial effusion. The thoracic aorta is normal in caliber. Mild atherosclerotic plaque of the thoracic aorta. No coronary artery calcifications. Mediastinum/Nodes: No enlarged mediastinal, hilar, or axillary lymph nodes. Thyroid gland, trachea, and esophagus demonstrate no  significant findings. Lungs/Pleura: Severe emphysematous changes most prominent along the upper lobes. Diffuse mild bronchial wall thickening. Bilateral lower lobe dependent patchy airspace opacities, right greater than left. No pulmonary nodule. No pulmonary mass. Trace bilateral pleural effusions, right greater than left. No pneumothorax. Upper Abdomen: Excretion of previously administered intravenous contrast noted. Please see separately dictated CT chest, abdomen, pelvis 12/11/2022. Musculoskeletal: No chest wall abnormality. No suspicious lytic or blastic osseous lesions. No acute displaced fracture. Review of the MIP images confirms the above findings. IMPRESSION: 1. Known left main pulmonary embolus with associated right heart strain (RV to LV ratio 1.6). No pulmonary infarction. 2. Bilateral lower lobe dependent patchy airspace opacities, right greater than left. 3. Trace bilateral pleural effusions, right greater than left. 4. Aortic Atherosclerosis (ICD10-I70.0) and Emphysema (ICD10-J43.9). Electronically Signed   By: MIven FinnM.D.   On: 12/11/2022 20:23   CT CHEST ABDOMEN PELVIS W CONTRAST  Addendum Date: 12/11/2022   ADDENDUM REPORT: 12/11/2022 18:34 ADDENDUM: Critical Value/emergent results were called by telephone at the time of interpretation on 12/11/2022 at 6:34 pm to provider CAROLYN GUILLOUD , who verbally acknowledged these results. Electronically Signed   By: GZetta BillsM.D.   On: 12/11/2022 18:34   Result Date: 12/11/2022 CLINICAL DATA:  Chronic diarrhea in a 67year old male. EXAM: CT CHEST, ABDOMEN, AND PELVIS WITH CONTRAST TECHNIQUE: Multidetector CT imaging of the chest, abdomen and pelvis was performed following the standard protocol during bolus administration of intravenous contrast. RADIATION DOSE REDUCTION: This exam was performed according to the departmental dose-optimization program which includes automated exposure control, adjustment of the mA and/or kV according  to patient size and/or use of iterative reconstruction technique. CONTRAST:  759mOMNIPAQUE  IOHEXOL 350 MG/ML SOLN COMPARISON:  None available. FINDINGS: CT CHEST FINDINGS Cardiovascular: Eccentric thrombus in the LEFT main pulmonary artery (image 28/3) this is along the posterior aspect of the pulmonary artery. No signs of RIGHT pulmonary arterial thrombus. Marked increased RV to LV ratio at 1.63. No pericardial effusion. Aorta is nondilated. Calcified and noncalcified plaque in the thoracic aorta without signs of aneurysm. Mediastinum/Nodes: No thoracic inlet, axillary, mediastinal or hilar adenopathy. Esophagus grossly normal. Lungs/Pleura: Marked pulmonary emphysema worse at the lung apices. No pneumothorax. Material in lower lobe bronchi bilaterally with bronchial wall thickening that is greatest on the RIGHT suspicious for aspiration related changes. Small area of consolidation in the RIGHT lung base. Small RIGHT-sided pleural effusion a trace LEFT-sided pleural fluid. Musculoskeletal: No chest wall mass. See below for full musculoskeletal details. CT ABDOMEN PELVIS FINDINGS Hepatobiliary: Limited assessment of the liver due to bolus timing which is essentially in early arterial phase when the bolus reaches the abdomen likely related to cardiac dysfunction. No pericholecystic stranding. No biliary duct dilation. Pancreas: Normal, without mass, inflammation or ductal dilatation. Spleen: Normal. Adrenals/Urinary Tract: Adrenal glands are normal. Symmetric renal enhancement without signs of hydronephrosis. Large renal sinus cyst on the RIGHT without suspicious features splays lower pole collecting systems. No dedicated imaging follow-up in the absence referable symptoms is suggested for this finding. No suspicious renal lesion. No ureteral dilation. No substantial perivesical stranding though there is mild bladder wall thickening which is circumferential and suggested in spite of mild bladder distension.  Stomach/Bowel: No signs of bowel obstruction. Query gastric thickening in perigastric stranding or edema though there is generalized edema throughout the abdomen and pelvis and within the body wall. Oral contrast media has reached the distal colon. Vascular/Lymphatic: No substantial atherosclerotic change. Patent abdominal aorta and its branches. No adenopathy in the abdomen. No adenopathy in the pelvis. Reproductive: Unremarkable to the extent evaluated. Other: Body wall edema. Small volume ascites in the pelvis. Presacral edema as well. Musculoskeletal: Body wall edema. No acute bone finding. No destructive bone process. Spinal degenerative changes. IMPRESSION: 1. Moderately large amount of eccentric thrombus in the LEFT main pulmonary artery. Findings suggest subacute or recent pulmonary embolism associated with massive dilation of the RIGHT heart and RIGHT heart strain. Positive for acute PE with CT evidence of right heart strain (RV/LV Ratio = 1.63) consistent with at least submassive (intermediate risk) PE. The presence of right heart strain has been associated with an increased risk of morbidity and mortality. Please refer to the "Code PE Focused" order set in EPIC. 2. Bibasilar aspiration worse on the RIGHT and associated with small effusions also worse on the RIGHT. 3. Small RIGHT-sided pleural effusion a trace LEFT-sided pleural fluid. 4. Query gastritis versus under distension. 5. Mild thickening of the urinary bladder raising the question of cystitis. 6. Signs of anasarca. 7. Pulmonary emphysema and aortic atherosclerosis. Aortic Atherosclerosis (ICD10-I70.0) and Emphysema (ICD10-J43.9). A call is out to the referring provider to further discuss findings in the above case. Electronically Signed: By: Zetta Bills M.D. On: 12/11/2022 18:23    Assessment/Plan:   Principal Problem:   Pulmonary embolism (HCC) Active Problems:   Tobacco use disorder   Acute respiratory failure with hypoxia (HCC)    Diarrhea   Unintentional weight loss   Acute hypoxic respiratory failure [facility])  Patient Summary: Erik Frerking. is a 67 y.o. male with a history of subarachnoid hemorrhage presenting to the hospital with concerns of shortness of breath, diarrhea, and fatigue.  Patient  admitted for further evaluation and management of acute hypoxic respiratory failure and found to have right heart failure secondary to subacute pulmonary embolism.   #Acute hypoxic respiratory failure likely secondary to unprovoked subacute high risk submassive pulmonary embolism #Subacute right heart failure #Parainfluenza infection CT findings yesterday concerning for left main artery pulmonary embolism causing right heart strain.  Patient evaluated this morning and denies any shortness of breath or chest pain.  Patient remains on 3.5 L nasal cannula.  On exam, patient does have trace bilateral edema appreciated.  No wheezing appreciated.  PCCM following recommending anticoagulation, with no plans for thrombolytics at this time.  Patient remains hemodynamically stable.  This is an unprovoked PE, and would like to evaluate for any underlying malignancy.  Patient does endorse he has a son who had blood clots a year ago, and could be a another underlying reason.  Given acute concern, will not pursue colonoscopy during admission, and can be followed up outpatient.  Did evaluate with PSA which was within normal limits.  PESI score 118, making as high risk. Currently as patient remains hemodynamically stable, can transition off heparin to NOAC.  Anticipate right heart failure secondary to PE, with underlying emphysematous changes secondary to smoking.  Patient could benefit from repeat echo once appropriate treatment for PE has been delivered.  Patient can also benefit from PFTs in the outpatient setting to evaluate for COPD.  Given how large this PE was, patient will likely need anticoagulation for lifetime.  -Stop prednisone given less  likely COPD exacerbation -Continue Anoro Ellipta -DuoNebs as needed -Transition from heparin drip to Xarelto -Cardiology following, appreciate recs -PCCM following, appreciate recs -Daily weights, strict I's and O's -Wean off oxygen as tolerated to maintain SpO2 greater than 92%, ambulate today as patient might need O2  -Patient could benefit from PFTs in the outpatient setting   #Chronic diarrhea #Weight loss Patient remains having some diarrhea.  Stool studies still pending.  No fevers or chills appreciated.  There is some concern from PCCM for weight loss, and considering chronic aspiration syndrome.  Will await stool studies before pursuing speech eval with modified barium swallow study. -Follow-up lab work and stool studies -Patient will benefit from outpatient colonoscopy -Speech eval if studies negative   #Metabolic alkalosis, resolving Bicarb at 33 today.  Holding diuresis is improving alkalosis. -Repeat BMP in a.m.  #Diabetes A1c 6.8.  Patient diagnosed with diabetes.  Patient remains on sliding scale.  Given GI symptoms, not a good candidate for metformin. -Continue sliding scale insulin -Monitor blood sugars while inpatient    #History of Subarachnoid hemorrhage  Patient does have history of subarachnoid hemorrhage.  Will need to monitor for neuro symptoms as patient is on anticoagulation. -Continue to monitor for neuro symptoms  Diet: Heart Healthy VTE: Enoxaparin IVF: None,None Code: Full  Dispo: Anticipated discharge to Home in 1 days pending clinical improvement.   [facility] Resident PGY-1 [phone] Please contact the on call pager after 5 pm and on weekends at [phone].

## 2022-12-12 NOTE — Progress Notes (Addendum)
ANTICOAGULATION CONSULT NOTE - Follow Up Consult  Pharmacy Consult for Heparin by continuous infusion-->transitioning to a DOCA (Rivaroxaban for treatment of PE.) Indication: pulmonary embolus  Allergies  Allergen Reactions   Tdap [Tetanus-Diphth-Acell Pertussis]     Patient reports breaking out in "lesions"    Patient Measurements: Height: '5\' 7"'$  (170.2 cm) Weight: 62.1 kg (137 lb) IBW/kg (Calculated) : 66.1 Heparin Dosing Weight: 62  Vital Signs: Temp: 97.6 F (36.4 C) (03/14 0720) Temp Source: Oral (03/14 0720) BP: 105/73 (03/14 0720) Pulse Rate: 65 (03/14 0720)  Labs: Recent Labs    12/09/22 2008 12/10/22 0445 12/11/22 0024 12/11/22 1903 12/11/22 2030 12/12/22 0038 12/12/22 0210  HGB  --  16.1 15.6  --   --  15.4  --   HCT  --  48.8 49.3  --   --  48.8  --   PLT  --  244 275  --   --  270  --   HEPARINUNFRC  --   --   --   --   --   --  0.37  CREATININE  --  0.93 0.85  --   --  0.88  --   TROPONINIHS 46*  --   --  22* 21*  --   --     Estimated Creatinine Clearance: 70.6 mL/min (by C-G formula based on SCr of 0.88 mg/dL).  Assessment/Plan: 67 year old male with PE on [place] transition to [place] (rivaroxaban, '15mg'$  PO BID x 21 days; then switch to '20mg'$  PO once-daily for minimally six months based upon degree of clot burden.Patient education to be provided by Pharmacy Students and myself with required documentation. Patient education completed.  Goal of Therapy:  Discontinue CIUFH, commence DOAC rivaroxaban at therapeutic dosing.     Pennie Banter, PharmD, CACP, CPP 12/12/2022,10:45 AM

## 2022-12-12 NOTE — Consult Note (Signed)
NAME:  Erik Utrera., MRN:  [id number], DOB:  09-03-1956, LOS: 2 ADMISSION DATE:  12/09/2022, CONSULTATION DATE:  12/11/22 REFERRING MD:  Erik Ward CHIEF COMPLAINT:  Dyspnea   History of Present Illness:  Erik Ward is seen in consultation at the request of IMTS for recommendations on further evaluation and management of PE.  67 year old man who presented to [facility] 3/11 with progressive dyspnea x 2 months, orthopnea, diarrhea, ~20lb unintentional weight loss. Has not received regular medical care recently. PMHx significant for syncope/collapse, SAH, ?COPD, ?CHF.  Admitted by IMTS for acute hypoxic respiratory failure felt to be 2/2 COPD exacerbation vs CHF exacerbation, +parainfluenza virus. Echo was ordered demonstrating normal LVEF and severe RV dilatation with low to normal RV function. He was treated with prednisone, bronchodilators, Lasix.  On 3/13, CT Chest/A/P was completed as part of ongoing workup which demonstrated moderately large amount of eccentric thrombus in the left main PA with RV/LV + 1.63, bibasilar aspiration R > L with small effusion, anasarca, emphysema.   CTA Chest was subsequently ordered and PCCM asked to see in consultation.  Pertinent Medical History:   Past Medical History:  Diagnosis Date   Subarachnoid hemorrhage following injury, with loss of consciousness ([place]) 09/18/2012   syncope and collapse (09/18/2012)   Syncope and collapse 09/18/2012   w/loss of consciousness (09/18/2012)   Significant Hospital Events: Including procedures, antibiotic start and stop dates in addition to other pertinent events   3/11 Admit 3/13 CT Chest/Abd/Pelv demonstrated PE. Dedicated CTA chest with known L main PE with R heart strain (ratio 1.6).  Interim History / Subjective:  Hemodynamically stable in the setting of pulmonary embolism  Objective:  Blood pressure 105/73, pulse 65, temperature 97.6 F (36.4 C), temperature source Oral, resp. rate 18, height '5\' 7"'$  (1.702  m), weight 62.1 kg, SpO2 93 %.        Intake/Output Summary (Last 24 hours) at 12/12/2022 0842 Last data filed at 12/12/2022 0601 Gross per 24 hour  Intake 775.03 ml  Output 1350 ml  Net -574.97 ml    Filed Weights   12/10/22 1341 12/11/22 0338 12/12/22 0041  Weight: 60 kg 62.6 kg 62.1 kg   Physical Examination: General: Well-nourished well-developed male no acute distress HEENT: MM pink/moist no JVD Neuro: Grossly intact without focal defect CV: Heart sounds are regular PULM: Diminished in the bases  GI: soft, bsx4 active  GU: Extremities: warm/dry, negative edema, no tenderness to palpation lower extremities Skin: no rashes or lesions   Labs/imaging personally reviewed:  Echo 3/12 > EF 55-60% with severe RV dilatation with low to normal RV function CT Chest/A/P 3/13 > This demonstrated moderately large amount of eccentric thrombus in the left main PA with RV/LV + 1.63, bibasilar aspiration R > L with small effusion, anasarca, emphysema.  CTA Chest 3/13 >  Known left main pulmonary embolus with associated right heart strain (RV to LV ratio 1.6), bilateral lower lobe dependent patchy airspace opacities R >L, trace bilateral pleural effusions R > L, Aortic Atherosclerosis (ICD10-I70.0) and Emphysema (ICD10-J43.9).  Assessment & Plan:   Subacute PE with cor pulmonale CT Chest demonstrated large thrombus in L main PA c/f subacute PE. CTA demonstrating known L main PE with evidence of R heart strain (RV/LV Ratio 1.63). Management options including conservative management, lytic administration and thrombectomy discussed with decision for conservative management/heparin gtt. Patient remains hemodynamically stable without significant tachycardia or hypotension and SpO2 remains > 90 on 3LNC.  Currently on heparin drip hemodynamically stable Continue  to await lower extremity Doppler studies Transition to p.o. anticoagulation    Acute hypoxic respiratory failure 2/2 above + CHF  exacerbation + possible aspiration. Likely only small component COPD exacerbation (presumed based on imaging, no PFT's) + parainfluenza.  Currently on 3.5 L nasal cannula with sats of 93% Wean O2 as tolerated Wean from steroids Needs outpatient pulmonary function test and outpatient pulmonary follow-up once he has recovered from acute illness   Rest per primary team  Best practice (evaluated daily):  Per primary team.  Labs   CBC: Recent Labs  Lab 12/09/22 1503 12/10/22 0445 12/11/22 0024 12/12/22 0038  WBC 5.4 5.6 5.8 4.9  NEUTROABS 4.0  --   --   --   HGB 17.2* 16.1 15.6 15.4  HCT 52.5* 48.8 49.3 48.8  MCV 96.3 95.7 97.6 99.6  PLT 251 244 275 [id number]    Basic Metabolic Panel: Recent Labs  Lab 12/09/22 1503 12/10/22 0445 12/11/22 0024 12/12/22 0038  NA 136 136 138 135  K 4.2 4.0 3.7 4.4  CL 101 100 97* 97*  CO2 24 28 34* 33*  GLUCOSE 99 151* 138* 105*  BUN 26* 25* 14 11  CREATININE 1.04 0.93 0.85 0.88  CALCIUM 8.7* 8.3* 8.5* 8.4*    GFR: Estimated Creatinine Clearance: 70.6 mL/min (by C-G formula based on SCr of 0.88 mg/dL). Recent Labs  Lab 12/09/22 1503 12/10/22 0445 12/11/22 0024 12/11/22 1903 12/11/22 2347 12/12/22 0038 12/12/22 0210  WBC 5.4 5.6 5.8  --   --  4.9  --   LATICACIDVEN  --   --   --  2.3* 1.4  --  1.6    Liver Function Tests: Recent Labs  Lab 12/10/22 0445  AST 54*  ALT 55*  ALKPHOS 110  BILITOT 1.0  PROT 5.6*  ALBUMIN 2.5*    No results for input(s): "LIPASE", "AMYLASE" in the last 168 hours. No results for input(s): "AMMONIA" in the last 168 hours.  ABG:    Component Value Date/Time   HCO3 41.5 (H) 12/11/2022 0800   TCO2 29 09/18/2012 1223   O2SAT 95.2 12/11/2022 0800    Coagulation Profile: No results for input(s): "INR", "PROTIME" in the last 168 hours.  Cardiac Enzymes: No results for input(s): "CKTOTAL", "CKMB", "CKMBINDEX", "TROPONINI" in the last 168 hours.  HbA1C: Hgb A1c MFr Bld  Date/Time Value Ref  Range Status  12/09/2022 08:08 PM 6.8 (H) 4.8 - 5.6 % Final    Comment:    (NOTE)         Prediabetes: 5.7 - 6.4         Diabetes: >6.4         Glycemic control for adults with diabetes: <7.0   12/01/2019 04:14 PM 6.2 (H) 4.8 - 5.6 % Final    Comment:             Prediabetes: 5.7 - 6.4          Diabetes: >6.4          Glycemic control for adults with diabetes: <7.0    CBG: Recent Labs  Lab 12/11/22 0850 12/11/22 1034 12/11/22 1612 12/11/22 2054 12/12/22 0606  GLUCAP 211* 152* 113* 168* 154*       Signature:  Richardson Landry Dalbert Stillings ACNP Acute Care Nurse Practitioner [place] Please consult Amion 12/12/2022, 8:43 AM

## 2022-12-12 NOTE — TOC Progression Note (Signed)
Transition of Care (TOC) - Progression Note    Patient Details  Name: Erik Ward. MRN: [id number] Date of Birth: Mar 13, 1956  [facility]) CM/SW Contact  Zenon Mayo, RN Phone Number: 12/12/2022, 2:04 PM  Clinical Narrative:    Patient may need home oxygen, NCM offered choice to patient, he states he does not have a preference but wants the small portable pocket oxygen.  NCM made referral to Bronwen Betters with [employer].  She will not be able to get the portable small one until tomorrow. When ambulatory sats are put in she will look at and get from epic.  Will need oxygen order also.        Expected Discharge Plan and Services                                               Social Determinants of Health (SDOH) Interventions SDOH Screenings   Food Insecurity: No Food Insecurity (12/10/2022)  Housing: Low Risk  (12/10/2022)  Transportation Needs: No Transportation Needs (12/10/2022)  Utilities: Not At Risk (12/10/2022)  Depression (PHQ2-9): Low Risk  (06/29/2020)  Tobacco Use: High Risk (12/09/2022)    Readmission Risk Interventions     No data to display

## 2022-12-12 NOTE — Progress Notes (Signed)
ANTICOAGULATION CONSULT NOTE  Pharmacy Consult:  Heparin Indication: PE Brief A/P: Heparin level within goal range Continue Heparin at current rate   Allergies  Allergen Reactions   Tdap [Tetanus-Diphth-Acell Pertussis]     Patient reports breaking out in "lesions"    Patient Measurements: Height: '5\' 7"'$  (170.2 cm) Weight: 62.1 kg (137 lb) IBW/kg (Calculated) : 66.1 Heparin Dosing Weight: 62 kg  Vital Signs: Temp: 98.1 F (36.7 C) (03/14 0041) Temp Source: Oral (03/14 0041) BP: 104/75 (03/14 0041) Pulse Rate: 72 (03/14 0041)  Labs: Recent Labs    12/09/22 2008 12/10/22 0445 12/11/22 0024 12/11/22 1903 12/11/22 2030 12/12/22 0038 12/12/22 0210  HGB  --  16.1 15.6  --   --  15.4  --   HCT  --  48.8 49.3  --   --  48.8  --   PLT  --  244 275  --   --  270  --   HEPARINUNFRC  --   --   --   --   --   --  0.37  CREATININE  --  0.93 0.85  --   --  0.88  --   TROPONINIHS 46*  --   --  22* 21*  --   --      Estimated Creatinine Clearance: 70.6 mL/min (by C-G formula based on SCr of 0.88 mg/dL).   Assessment: 67 y.o. male with PE for heparin  Goal of Therapy:  Heparin level 0.3-0.7 units/ml Monitor platelets by anticoagulation protocol: Yes   Plan:  No change to heparin  Check heparin level in 8 hours to verify ] Phillis Knack, PharmD, BCPS  12/12/2022, 4:00 AM

## 2022-12-12 NOTE — TOC Benefit Eligibility Note (Signed)
Patient Teacher, English as a foreign language completed.    The patient is currently admitted and upon discharge could be taking Xarelto Starter Pack.  The current 30 day co-pay is $0.00.   The patient is insured through [place], [place] Patient Advocate Specialist [place] Patient Advocate Team Direct Number: [phone]  Fax: [phone]

## 2022-12-13 ENCOUNTER — Other Ambulatory Visit (HOSPITAL_COMMUNITY): Payer: Self-pay

## 2022-12-13 ENCOUNTER — Other Ambulatory Visit: Payer: Self-pay | Admitting: *Deleted

## 2022-12-13 ENCOUNTER — Inpatient Hospital Stay (HOSPITAL_COMMUNITY): Payer: 59

## 2022-12-13 DIAGNOSIS — I509 Heart failure, unspecified: Secondary | ICD-10-CM

## 2022-12-13 LAB — CBC
HCT: 46.4 % (ref 39.0–52.0)
Hemoglobin: 14.8 g/dL (ref 13.0–17.0)
MCH: 32.2 pg (ref 26.0–34.0)
MCHC: 31.9 g/dL (ref 30.0–36.0)
MCV: 100.9 fL — ABNORMAL HIGH (ref 80.0–100.0)
Platelets: 283 10*3/uL (ref 150–400)
RBC: 4.6 MIL/uL (ref 4.22–5.81)
RDW: 14.6 % (ref 11.5–15.5)
WBC: 4.9 10*3/uL (ref 4.0–10.5)
nRBC: 0 % (ref 0.0–0.2)

## 2022-12-13 LAB — TYPE AND SCREEN
ABO/RH(D): B POS
Antibody Screen: NEGATIVE

## 2022-12-13 LAB — CALPROTECTIN, FECAL: Calprotectin, Fecal: 118 ug/g (ref 0–120)

## 2022-12-13 LAB — BASIC METABOLIC PANEL
Anion gap: 10 (ref 5–15)
BUN: 17 mg/dL (ref 8–23)
CO2: 34 mmol/L — ABNORMAL HIGH (ref 22–32)
Calcium: 8.4 mg/dL — ABNORMAL LOW (ref 8.9–10.3)
Chloride: 94 mmol/L — ABNORMAL LOW (ref 98–111)
Creatinine, Ser: 0.9 mg/dL (ref 0.61–1.24)
GFR, Estimated: >60 mL/min (ref >60)
Glucose, Bld: 120 mg/dL — ABNORMAL HIGH (ref 70–99)
Potassium: 4 mmol/L (ref 3.5–5.1)
Sodium: 138 mmol/L (ref 135–145)

## 2022-12-13 LAB — GLUCOSE, CAPILLARY: Glucose-Capillary: 90 mg/dL (ref 70–99)

## 2022-12-13 MED ORDER — RIVAROXABAN 20 MG PO TABS
20.0000 mg | ORAL_TABLET | Freq: Every day | ORAL | 0 refills | Status: DC
  Filled 2022-12-13: qty 60, 60d supply, fill #0

## 2022-12-13 MED ORDER — UMECLIDINIUM BROMIDE 62.5 MCG/ACT IN AEPB
1.0000 | INHALATION_SPRAY | Freq: Every day | RESPIRATORY_TRACT | Status: DC
  Administered 2022-12-13: 1 via RESPIRATORY_TRACT
  Filled 2022-12-13: qty 7

## 2022-12-13 MED ORDER — FLUTICASONE FUROATE-VILANTEROL 100-25 MCG/ACT IN AEPB
1.0000 | INHALATION_SPRAY | Freq: Every day | RESPIRATORY_TRACT | 0 refills | Status: DC
  Filled 2022-12-13: qty 60, 60d supply, fill #0

## 2022-12-13 MED ORDER — UMECLIDINIUM BROMIDE 62.5 MCG/ACT IN AEPB
1.0000 | INHALATION_SPRAY | Freq: Every day | RESPIRATORY_TRACT | 0 refills | Status: AC
  Filled 2022-12-13: qty 30, 30d supply, fill #0

## 2022-12-13 MED ORDER — FLUTICASONE FUROATE-VILANTEROL 100-25 MCG/ACT IN AEPB
1.0000 | INHALATION_SPRAY | Freq: Every day | RESPIRATORY_TRACT | Status: DC
  Administered 2022-12-13: 1 via RESPIRATORY_TRACT
  Filled 2022-12-13: qty 28

## 2022-12-13 MED ORDER — BUDESONIDE 0.5 MG/2ML IN SUSP: 0.5000 mg | Freq: Two times a day (BID) | RESPIRATORY_TRACT | Status: DC

## 2022-12-13 MED ORDER — RIVAROXABAN (XARELTO) VTE STARTER PACK (15 & 20 MG)
15.0000 mg | ORAL_TABLET | Freq: Two times a day (BID) | ORAL | 0 refills | Status: DC
  Filled 2022-12-13: qty 51, 30d supply, fill #0

## 2022-12-13 MED ORDER — ALBUTEROL SULFATE HFA 108 (90 BASE) MCG/ACT IN AERS
2.0000 | INHALATION_SPRAY | Freq: Four times a day (QID) | RESPIRATORY_TRACT | 2 refills | Status: DC | PRN
  Filled 2022-12-13 – 2023-01-01 (×2): qty 6.7, 25d supply, fill #0
  Filled 2023-02-15: qty 6.7, 25d supply, fill #1

## 2022-12-13 MED ORDER — PREDNISONE 20 MG PO TABS
40.0000 mg | ORAL_TABLET | Freq: Every day | ORAL | Status: DC
  Administered 2022-12-13: 40 mg via ORAL
  Filled 2022-12-13: qty 2

## 2022-12-13 MED ORDER — IPRATROPIUM-ALBUTEROL 0.5-2.5 (3) MG/3ML IN SOLN
3.0000 mL | RESPIRATORY_TRACT | 0 refills | Status: AC | PRN
  Filled 2022-12-13: qty 360, 20d supply, fill #0

## 2022-12-13 NOTE — TOC Progression Note (Signed)
Transition of Care (TOC) - Progression Note    Patient Details  Name: Erik Ward. MRN: [id number] Date of Birth: 30-Sep-1956  Transition of Care [facility]) CM/SW Contact  Zenon Mayo, RN Phone Number: 12/13/2022, 10:59 AM  Clinical Narrative:    NCM received notification that  [facility] is out of network with insurance.  NCM informed patient, he states he is ok with  trying [employer].  NCM made referral to [facility] with [employer] for home oxygen.         Expected Discharge Plan and Services                                               Social Determinants of Health (SDOH) Interventions SDOH Screenings   Food Insecurity: No Food Insecurity (12/10/2022)  Housing: Low Risk  (12/10/2022)  Transportation Needs: No Transportation Needs (12/10/2022)  Utilities: Not At Risk (12/10/2022)  Depression (PHQ2-9): Low Risk  (06/29/2020)  Tobacco Use: High Risk (12/09/2022)    Readmission Risk Interventions     No data to display

## 2022-12-13 NOTE — Progress Notes (Signed)
Modified Barium Swallow Study  Patient Details  Name: Erik Ward. MRN: [id number] Date of Birth: 1956-06-21  Today's Date: 12/13/2022  Modified Barium Swallow completed.  Full report located under Chart Review in the Imaging Section.  History of Present Illness Erik Ward. is a 67 y.o. male who presented to the hospital with concerns of shortness of breath, diarrhea, and fatigue.  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 Chest CT 3/13 with BLL opacities R>L. Pt has had increased O2 requirements.   Clinical Impression Pt's oropharyngeal swallow mechanism was within functional limits, but with reduced tongue base retraction, anterior laryngeal moment and pharyngeal stripping. These resulted in residue in the valleculae and pyriform sinuses which was improved with a liquid wash or with pt's independent use secondary swallows. No instances of aspiration or dysfunctional penetration were noted throughout the session. A regular texture diet with thin liquids is recommended at this time. Further skilled SLP services are not clinically indicated at this time. Factors that may increase risk of adverse event in presence of aspiration Phineas Douglas & Padilla 2021):  (N/A)  Swallow Evaluation Recommendations Recommendations: PO diet PO Diet Recommendation: Regular;Thin liquids (Level 0) Liquid Administration via: Cup;Straw Medication Administration: Whole meds with liquid Supervision: Patient able to self-feed Swallowing strategies  : Follow solids with liquids Postural changes: Position pt fully upright for meals    Erik Ward, Erik Ward, Erik Ward Office number [phone]   Horton Marshall 12/13/2022,3:01 PM

## 2022-12-13 NOTE — Progress Notes (Signed)
SATURATION QUALIFICATIONS: (This note is used to comply with regulatory documentation for home oxygen)  Patient Saturations on Room Air at Rest = 87%  Patient Saturations on Room Air while Ambulating = 84%  Patient Saturations on 3 Liters of oxygen while Ambulating = 90%  Irven Baltimore, RN

## 2022-12-13 NOTE — Discharge Instructions (Addendum)
Mr. Erik Ward, It was a pleasure taking care of you at [facility] were admitted for pulmonary embolism and and treated with blood thinners.  We are discharging you home now that you are doing better. Please follow the following instructions.   1) Regarding your outpatient medications, I have changed many of your medications.  First of all, you will be on a blood thinner.  For the first 21 days I want you to take 15 mg of Xarelto twice daily, 1 time in the morning, 1 time at night.  After the first 21 days, I want you to take 20 mg of Xarelto daily for the rest of your life.  Please never miss a dose, as this can put you at risk for further clots.  2) Regarding your oxygen, we are sending you home on 3 L of oxygen, please wear this at home with activity and at night.  As your clot dissolves, you should start feeling better and your oxygen requirement should decrease.  3) I suspect you have COPD given your long history of smoking and imaging findings.  I am discharging you on inhalers. One is called Incruse, please take this daily.  The other is called Breo, please take that daily as well.  There is another one named albuterol, please take this when you feel very short of breath and start wheezing as this is your rescue inhaler.  4) I have a scheduled you for an appointment in the internal medicine center.  It is located in the place of this hospital.  Please ensure that you go to this appointment.  It is at 115pm on 12/19/2022.  This is a Thursday.  Please show up to this appointment.  At that time, please asked the doctor to talk to about colonoscopy and pulmonary function test.  5) You have been diagnosed with diabetes during your hospitalization.  Please continue to follow this up outpatient.  6) Regarding your other stool studies, we will follow this up outpatient.  We will also follow-up your swallow study outpatient.  Take care,  Dr. Leigh Aurora, DO

## 2022-12-13 NOTE — Plan of Care (Signed)
  Problem: Nutrition: Goal: Adequate nutrition will be maintained Outcome: Completed/Met   Problem: Elimination: Goal: Will not experience complications related to urinary retention Outcome: Completed/Met   Problem: Pain Managment: Goal: General experience of comfort will improve Outcome: Completed/Met   Problem: Safety: Goal: Ability to remain free from injury will improve Outcome: Completed/Met   Problem: Skin Integrity: Goal: Risk for impaired skin integrity will decrease Outcome: Completed/Met   Problem: Skin Integrity: Goal: Risk for impaired skin integrity will decrease Outcome: Completed/Met   

## 2022-12-13 NOTE — Progress Notes (Signed)
DAILY PROGRESS NOTE   Patient Name: Erik Ward. Date of Encounter: 12/13/2022 Cardiologist: Pixie Casino, MD  Chief Complaint   No complaints  Patient Profile   67 yo male with shortness of breath, found to have probable acute right heart failure   Subjective   Net negative 451 ml overnight- creatinine stable. Venous dopplers negative for DVT. CT angio showed trace bilateral pleural effusions.  Objective   Vitals:   12/12/22 1956 12/13/22 0018 12/13/22 0529 12/13/22 0732  BP: 98/70 95/68 101/69 110/76  Pulse: 79 75 64 66  Resp: 19 18 18 18   Temp: 98.2 F (36.8 C) 98.3 F (36.8 C) (!) 97.5 F (36.4 C) 98.6 F (37 C)  TempSrc: Oral Oral Oral Oral  SpO2: 91% 94% 95% 92%  Weight:   63.4 kg   Height:        Intake/Output Summary (Last 24 hours) at 12/13/2022 [id number] Last data filed at 12/13/2022 0815 Gross per 24 hour  Intake 940 ml  Output 1151 ml  Net -211 ml   Filed Weights   12/11/22 0338 12/12/22 0041 12/13/22 0529  Weight: 62.6 kg 62.1 kg 63.4 kg    Physical Exam   General appearance: alert and no distress Neck: no carotid bruit, supple, symmetrical, trachea midline, and thyroid not enlarged, symmetric, no tenderness/mass/nodules Lungs: diminished breath sounds bibasilar Heart: regular rate and rhythm Extremities: extremities normal, atraumatic, no cyanosis or edema Neurologic: Grossly normal  Inpatient Medications    Scheduled Meds:  fluticasone furoate-vilanterol  1 puff Inhalation Daily   predniSONE  40 mg Oral Q breakfast   Rivaroxaban  15 mg Oral BID WC   Followed by   Derrill Memo ON 01/02/2023] rivaroxaban  20 mg Oral Q supper   umeclidinium bromide  1 puff Inhalation Daily    Continuous Infusions:    PRN Meds: acetaminophen **OR** acetaminophen, ipratropium-albuterol   Labs   Results for orders placed or performed during the hospital encounter of 12/09/22 (from the past 48 hour(s))  Glucose, capillary     Status: Abnormal    Collection Time: 12/11/22 10:34 AM  Result Value Ref Range   Glucose-Capillary 152 (H) 70 - 99 mg/dL    Comment: Glucose reference range applies only to samples taken after fasting for at least 8 hours.  Glucose, capillary     Status: Abnormal   Collection Time: 12/11/22  4:12 PM  Result Value Ref Range   Glucose-Capillary 113 (H) 70 - 99 mg/dL    Comment: Glucose reference range applies only to samples taken after fasting for at least 8 hours.  Troponin I (High Sensitivity)     Status: Abnormal   Collection Time: 12/11/22  7:03 PM  Result Value Ref Range   Troponin I (High Sensitivity) 22 (H) <18 ng/L    Comment: DELTA CHECK NOTED (NOTE) Elevated high sensitivity troponin I (hsTnI) values and significant  changes across serial measurements may suggest ACS but many other  chronic and acute conditions are known to elevate hsTnI results.  Refer to the "Links" section for chest pain algorithms and additional  guidance. Performed at [facility], [place] [address] [place], [place] [address]   Brain natriuretic peptide     Status: Abnormal   Collection Time: 12/11/22  7:03 PM  Result Value Ref Range   B Natriuretic Peptide 651.9 (H) 0.0 - 100.0 pg/mL    Comment: Performed at [facility] [address] [place], [place] [address]  Lactic  acid, plasma     Status: Abnormal   Collection Time: 12/11/22  7:03 PM  Result Value Ref Range   Lactic Acid, Venous 2.3 (HH) 0.5 - 1.9 mmol/L    Comment: CRITICAL RESULT CALLED TO, READ BACK BY AND VERIFIED WITH  A  REDMAN,RN 2030 12/11/2022 WBOND Performed at [facility], [place] [address] [place], [place] [address]   Troponin I (High Sensitivity)     Status: Abnormal   Collection Time: 12/11/22  8:30 PM  Result Value Ref Range   Troponin I (High Sensitivity) 21 (H) <18 ng/L    Comment: (NOTE) Elevated high sensitivity troponin I (hsTnI) values and significant  changes across serial measurements may suggest ACS but many other   chronic and acute conditions are known to elevate hsTnI results.  Refer to the "Links" section for chest pain algorithms and additional  guidance. Performed at [facility], [place] [address], [place], [place] [address]   Glucose, capillary     Status: Abnormal   Collection Time: 12/11/22  8:54 PM  Result Value Ref Range   Glucose-Capillary 168 (H) 70 - 99 mg/dL    Comment: Glucose reference range applies only to samples taken after fasting for at least 8 hours.  Lactic acid, plasma     Status: None   Collection Time: 12/11/22 11:47 PM  Result Value Ref Range   Lactic Acid, Venous 1.4 0.5 - 1.9 mmol/L    Comment: Performed at [facility], [place] [address] [place], [place] [id number]  Basic metabolic panel     Status: Abnormal   Collection Time: 12/12/22 12:38 AM  Result Value Ref Range   Sodium 135 135 - 145 mmol/L   Potassium 4.4 3.5 - 5.1 mmol/L   Chloride 97 (L) 98 - 111 mmol/L   CO2 33 (H) 22 - 32 mmol/L   Glucose, Bld 105 (H) 70 - 99 mg/dL    Comment: Glucose reference range applies only to samples taken after fasting for at least 8 hours.   BUN 11 8 - 23 mg/dL   Creatinine, Ser 0.88 0.61 - 1.24 mg/dL   Calcium 8.4 (L) 8.9 - 10.3 mg/dL   GFR, Estimated >60 >60 mL/min    Comment: (NOTE) Calculated using the CKD-EPI Creatinine Equation (2021)    Anion gap 5 5 - 15    Comment: Performed at [facility] [address], [place], [place] [address]  CBC     Status: None   Collection Time: 12/12/22 12:38 AM  Result Value Ref Range   WBC 4.9 4.0 - 10.5 K/uL   RBC 4.90 4.22 - 5.81 MIL/uL   Hemoglobin 15.4 13.0 - 17.0 g/dL   HCT 48.8 39.0 - 52.0 %   MCV 99.6 80.0 - 100.0 fL   MCH 31.4 26.0 - 34.0 pg   MCHC 31.6 30.0 - 36.0 g/dL   RDW 14.6 11.5 - 15.5 %   Platelets 270 150 - 400 K/uL   nRBC 0.0 0.0 - 0.2 %    Comment: Performed at [facility], [place] [address], [place], [place] [address]  Type and screen [place]     Status: None    Collection Time: 12/12/22 12:38 AM  Result Value Ref Range   ABO/RH(D) B POS    Antibody Screen NEG    Sample Expiration      12/15/2022,2359 Performed at [facility], [place] [address], [place], [place] [address]  Heparin level (unfractionated)     Status: None   Collection Time: 12/12/22  2:10 AM  Result Value Ref Range   Heparin Unfractionated 0.37 0.30 - 0.70 IU/mL    Comment: (NOTE) The clinical reportable range upper limit is being lowered to >1.10 to align with the FDA approved guidance for the current laboratory assay.  If heparin results are below expected values, and patient dosage has  been confirmed, suggest follow up testing of antithrombin III levels. Performed at [facility], [place] [address] [place], [place] [address]   Lactic acid, plasma     Status: None   Collection Time: 12/12/22  2:10 AM  Result Value Ref Range   Lactic Acid, Venous 1.6 0.5 - 1.9 mmol/L    Comment: Performed at [facility] [address], [place], [place] [address]  ABO/Rh     Status: None   Collection Time: 12/12/22  2:10 AM  Result Value Ref Range   ABO/RH(D)      B POS Performed at [facility] [address] [place], [place] [address]   Glucose, capillary     Status: Abnormal   Collection Time: 12/12/22  6:06 AM  Result Value Ref Range   Glucose-Capillary 154 (H) 70 - 99 mg/dL    Comment: Glucose reference range applies only to samples taken after fasting for at least 8 hours.  Heparin level (unfractionated)     Status: None   Collection Time: 12/12/22  9:59 AM  Result Value Ref Range   Heparin Unfractionated 0.34 0.30 - 0.70 IU/mL    Comment: (NOTE) The clinical reportable range upper limit is being lowered to >1.10 to align with the FDA approved guidance for the current laboratory assay.  If heparin results are below expected values, and patient dosage has  been confirmed, suggest follow up testing of antithrombin III levels. Performed at [facility], [place] [address], [place], [place] [address]   Glucose, capillary     Status: Abnormal   Collection Time: 12/12/22 11:35 AM  Result Value Ref Range   Glucose-Capillary 102 (H) 70 - 99 mg/dL    Comment: Glucose reference range applies only to samples taken after fasting for at least 8 hours.  CBC     Status: Abnormal   Collection Time: 12/13/22 12:11 AM  Result Value Ref Range   WBC 4.9 4.0 - 10.5 K/uL   RBC 4.60 4.22 - 5.81 MIL/uL   Hemoglobin 14.8 13.0 - 17.0 g/dL   HCT 46.4 39.0 - 52.0 %   MCV 100.9 (H) 80.0 - 100.0 fL   MCH 32.2 26.0 - 34.0 pg   MCHC 31.9 30.0 - 36.0 g/dL   RDW 14.6 11.5 - 15.5 %   Platelets 283 150 - 400 K/uL   nRBC 0.0 0.0 - 0.2 %    Comment: Performed at [facility], [place] [address] [place], [place] [id number]  Basic metabolic panel     Status: Abnormal   Collection Time: 12/13/22 12:11 AM  Result Value Ref Range   Sodium 138 135 - 145 mmol/L   Potassium 4.0 3.5 - 5.1 mmol/L   Chloride 94 (L) 98 - 111 mmol/L   CO2 34 (H) 22 - 32 mmol/L   Glucose, Bld 120 (H) 70 - 99 mg/dL    Comment: Glucose reference range applies only to samples taken after fasting for at least 8 hours.   BUN 17 8 - 23 mg/dL  Creatinine, Ser 0.90 0.61 - 1.24 mg/dL   Calcium 8.4 (L) 8.9 - 10.3 mg/dL   GFR, Estimated >60 >60 mL/min    Comment: (NOTE) Calculated using the CKD-EPI Creatinine Equation (2021)    Anion gap 10 5 - 15    Comment: Performed at [facility] [address], [place], [place] [address]  Glucose, capillary     Status: None   Collection Time: 12/13/22  5:51 AM  Result Value Ref Range   Glucose-Capillary 90 70 - 99 mg/dL    Comment: Glucose reference range applies only to samples taken after fasting for at least 8 hours.   Comment 1 Notify RN    Comment 2 Document in Chart     ECG   N/A  Telemetry   Sinus rhythm with RBBB - Personally Reviewed  Radiology    VAS [place] LOWER EXTREMITY VENOUS (DVT)  Result Date: 12/12/2022   Lower Venous DVT Study Patient Name:  Somtochukwu Hiracheta.  Date of Exam:   12/12/2022 Medical Rec #: [id number]            Accession #:    [id number] Date of Birth: 06/26/1956            Patient Gender: M Patient Age:   60 years Exam Location:  [facility] Procedure:      VAS [place] LOWER EXTREMITY VENOUS (DVT) Referring Phys: Montey Hora --------------------------------------------------------------------------------  Indications: Swelling, SOB, and subacute pulmonary embolism.  Risk Factors: Acute right heart failure. Comparison Study: No prior study on file Performing Technologist: Sharion Dove RVS  Examination Guidelines: A complete evaluation includes B-mode imaging, spectral Doppler, color Doppler, and power Doppler as needed of all accessible portions of each vessel. Bilateral testing is considered an integral part of a complete examination. Limited examinations for reoccurring indications may be performed as noted. The reflux portion of the exam is performed with the patient in reverse Trendelenburg.  +--------+---------------+---------+-----------+----------------+-------------+ RIGHT   CompressibilityPhasicitySpontaneityProperties      Thrombus                                                                 Aging         +--------+---------------+---------+-----------+----------------+-------------+ CFV     Full                               pulsatile                                                                waveforms                     +--------+---------------+---------+-----------+----------------+-------------+ SFJ     Full                                                             +--------+---------------+---------+-----------+----------------+-------------+  FV Prox Full                                                             +--------+---------------+---------+-----------+----------------+-------------+ FV Mid  Full                                                              +--------+---------------+---------+-----------+----------------+-------------+ FV      Full                                                             Distal                                                                   +--------+---------------+---------+-----------+----------------+-------------+ PFV     Full                                                             +--------+---------------+---------+-----------+----------------+-------------+ POP     Full                               pulsatile                                                                waveforms                     +--------+---------------+---------+-----------+----------------+-------------+ PTV     Full                                                             +--------+---------------+---------+-----------+----------------+-------------+ PERO    Full                                                             +--------+---------------+---------+-----------+----------------+-------------+   +--------+---------------+---------+-----------+----------------+-------------+ LEFT    CompressibilityPhasicitySpontaneityProperties  Thrombus                                                                 Aging         +--------+---------------+---------+-----------+----------------+-------------+ CFV     Full                               pulsatile                                                                waveforms                     +--------+---------------+---------+-----------+----------------+-------------+ SFJ     Full                                                             +--------+---------------+---------+-----------+----------------+-------------+ FV Prox Full                                                              +--------+---------------+---------+-----------+----------------+-------------+ FV Mid  Full                               pulsatile                                                                waveforms                     +--------+---------------+---------+-----------+----------------+-------------+ FV      Full                                                             Distal                                                                   +--------+---------------+---------+-----------+----------------+-------------+ PFV     Full                                                             +--------+---------------+---------+-----------+----------------+-------------+  POP     Full                               pulsatile                                                                waveforms                     +--------+---------------+---------+-----------+----------------+-------------+ PTV     Full                                                             +--------+---------------+---------+-----------+----------------+-------------+ PERO    Full                                                             +--------+---------------+---------+-----------+----------------+-------------+     Summary: BILATERAL: - No evidence of deep vein thrombosis seen in the lower extremities, bilaterally. -No evidence of popliteal cyst, bilaterally. RIGHT: pulsatile waveforms suggestive of fluid overload  LEFT: Pulsatile waveforms suggestive of fluid overload.  *See table(s) above for measurements and observations. Electronically signed by Orlie Pollen on 12/12/2022 at 5:33:17 PM.    Final    CT Angio Chest Pulmonary Embolism (PE) W or WO Contrast  Result Date: 12/11/2022 CLINICAL DATA:  Dyspnea, chronic, chest wall or pleura disease suspected EXAM: CT ANGIOGRAPHY CHEST WITH CONTRAST TECHNIQUE: Multidetector CT imaging of the chest was performed using  the standard protocol during bolus administration of intravenous contrast. Multiplanar CT image reconstructions and MIPs were obtained to evaluate the vascular anatomy. RADIATION DOSE REDUCTION: This exam was performed according to the departmental dose-optimization program which includes automated exposure control, adjustment of the mA and/or kV according to patient size and/or use of iterative reconstruction technique. CONTRAST:  37mL OMNIPAQUE IOHEXOL 350 MG/ML SOLN COMPARISON:  None Available. FINDINGS: Cardiovascular: Satisfactory opacification of the pulmonary arteries to the segmental level. Redemonstration of left main pulmonary artery filling defect extending to the left lower lobe segmental pulmonary arteries. Enlarged right to left ventricular ratio (1.6). Enlarged right atria. no significant pericardial effusion. The thoracic aorta is normal in caliber. Mild atherosclerotic plaque of the thoracic aorta. No coronary artery calcifications. Mediastinum/Nodes: No enlarged mediastinal, hilar, or axillary lymph nodes. Thyroid gland, trachea, and esophagus demonstrate no significant findings. Lungs/Pleura: Severe emphysematous changes most prominent along the upper lobes. Diffuse mild bronchial wall thickening. Bilateral lower lobe dependent patchy airspace opacities, right greater than left. No pulmonary nodule. No pulmonary mass. Trace bilateral pleural effusions, right greater than left. No pneumothorax. Upper Abdomen: Excretion of previously administered intravenous contrast noted. Please see separately dictated CT chest, abdomen, pelvis 12/11/2022. Musculoskeletal: No chest wall abnormality. No suspicious lytic or blastic osseous lesions. No acute displaced fracture. Review of the MIP images confirms the above findings. IMPRESSION: 1. Known left main pulmonary  embolus with associated right heart strain (RV to LV ratio 1.6). No pulmonary infarction. 2. Bilateral lower lobe dependent patchy airspace  opacities, right greater than left. 3. Trace bilateral pleural effusions, right greater than left. 4. Aortic Atherosclerosis (ICD10-I70.0) and Emphysema (ICD10-J43.9). Electronically Signed   By: Iven Finn M.D.   On: 12/11/2022 20:23   CT CHEST ABDOMEN PELVIS W CONTRAST  Addendum Date: 12/11/2022   ADDENDUM REPORT: 12/11/2022 18:34 ADDENDUM: Critical Value/emergent results were called by telephone at the time of interpretation on 12/11/2022 at 6:34 pm to provider CAROLYN GUILLOUD , who verbally acknowledged these results. Electronically Signed   By: Zetta Bills M.D.   On: 12/11/2022 18:34   Result Date: 12/11/2022 CLINICAL DATA:  Chronic diarrhea in a 67 year old male. EXAM: CT CHEST, ABDOMEN, AND PELVIS WITH CONTRAST TECHNIQUE: Multidetector CT imaging of the chest, abdomen and pelvis was performed following the standard protocol during bolus administration of intravenous contrast. RADIATION DOSE REDUCTION: This exam was performed according to the departmental dose-optimization program which includes automated exposure control, adjustment of the mA and/or kV according to patient size and/or use of iterative reconstruction technique. CONTRAST:  42mL OMNIPAQUE IOHEXOL 350 MG/ML SOLN COMPARISON:  None available. FINDINGS: CT CHEST FINDINGS Cardiovascular: Eccentric thrombus in the LEFT main pulmonary artery (image 28/3) this is along the posterior aspect of the pulmonary artery. No signs of RIGHT pulmonary arterial thrombus. Marked increased RV to LV ratio at 1.63. No pericardial effusion. Aorta is nondilated. Calcified and noncalcified plaque in the thoracic aorta without signs of aneurysm. Mediastinum/Nodes: No thoracic inlet, axillary, mediastinal or hilar adenopathy. Esophagus grossly normal. Lungs/Pleura: Marked pulmonary emphysema worse at the lung apices. No pneumothorax. Material in lower lobe bronchi bilaterally with bronchial wall thickening that is greatest on the RIGHT suspicious for  aspiration related changes. Small area of consolidation in the RIGHT lung base. Small RIGHT-sided pleural effusion a trace LEFT-sided pleural fluid. Musculoskeletal: No chest wall mass. See below for full musculoskeletal details. CT ABDOMEN PELVIS FINDINGS Hepatobiliary: Limited assessment of the liver due to bolus timing which is essentially in early arterial phase when the bolus reaches the abdomen likely related to cardiac dysfunction. No pericholecystic stranding. No biliary duct dilation. Pancreas: Normal, without mass, inflammation or ductal dilatation. Spleen: Normal. Adrenals/Urinary Tract: Adrenal glands are normal. Symmetric renal enhancement without signs of hydronephrosis. Large renal sinus cyst on the RIGHT without suspicious features splays lower pole collecting systems. No dedicated imaging follow-up in the absence referable symptoms is suggested for this finding. No suspicious renal lesion. No ureteral dilation. No substantial perivesical stranding though there is mild bladder wall thickening which is circumferential and suggested in spite of mild bladder distension. Stomach/Bowel: No signs of bowel obstruction. Query gastric thickening in perigastric stranding or edema though there is generalized edema throughout the abdomen and pelvis and within the body wall. Oral contrast media has reached the distal colon. Vascular/Lymphatic: No substantial atherosclerotic change. Patent abdominal aorta and its branches. No adenopathy in the abdomen. No adenopathy in the pelvis. Reproductive: Unremarkable to the extent evaluated. Other: Body wall edema. Small volume ascites in the pelvis. Presacral edema as well. Musculoskeletal: Body wall edema. No acute bone finding. No destructive bone process. Spinal degenerative changes. IMPRESSION: 1. Moderately large amount of eccentric thrombus in the LEFT main pulmonary artery. Findings suggest subacute or recent pulmonary embolism associated with massive dilation of  the RIGHT heart and RIGHT heart strain. Positive for acute PE with CT evidence of right heart strain (RV/LV Ratio =  1.63) consistent with at least submassive (intermediate risk) PE. The presence of right heart strain has been associated with an increased risk of morbidity and mortality. Please refer to the "Code PE Focused" order set in EPIC. 2. Bibasilar aspiration worse on the RIGHT and associated with small effusions also worse on the RIGHT. 3. Small RIGHT-sided pleural effusion a trace LEFT-sided pleural fluid. 4. Query gastritis versus under distension. 5. Mild thickening of the urinary bladder raising the question of cystitis. 6. Signs of anasarca. 7. Pulmonary emphysema and aortic atherosclerosis. Aortic Atherosclerosis (ICD10-I70.0) and Emphysema (ICD10-J43.9). A call is out to the referring provider to further discuss findings in the above case. Electronically Signed: By: Zetta Bills M.D. On: 12/11/2022 18:23    Cardiac Studies   Echo above  Assessment   Principal Problem:   Pulmonary embolism (HCC) Active Problems:   Tobacco use disorder   Acute respiratory failure with hypoxia (HCC)   Diarrhea   Unintentional weight loss   Acute hypoxic respiratory failure ([place])   Plan   Slightly net negative overnight- creatinine stable. Echo showed a severely dilated RV, but RV systolic function felt to be preserved. Filling pressures were elevated, but seem improved. Suspect his oxygenation will improve with resolution of PE. The PE may have been denovo if he has underlying pulmonary hypertension. If this is the case, suspect he will need lifelong anticoagulation.   Time Spent Directly with Patient:  I have spent a total of 25 minutes with the patient reviewing hospital notes, telemetry, EKGs, labs and examining the patient as well as establishing an assessment and plan that was discussed personally with the patient.  > 50% of time was spent in direct patient care.  Length of Stay:  LOS: 3  days   Pixie Casino, MD, [facility], [place] Director of the Advanced Lipid Disorders &  Cardiovascular Risk Reduction Clinic Diplomate of the American Board of Clinical Lipidology Attending Cardiologist  Direct Dial: [phone]  Fax: [phone]  Website:  [email or web address] 12/13/2022, 9:21 AM

## 2022-12-13 NOTE — Care Management Important Message (Signed)
Important Message  Patient Details  Name: Erik Ward. MRN: [id number] Date of Birth: [birth year]   Medicare Important Message Given:  Yes     [name] 12/13/2022, 8:19 AM

## 2022-12-13 NOTE — Discharge Summary (Signed)
Name: Erik Ward. MRN: [id number] DOB: [birth year] 67 y.o. PCP: Antony Blackbird, MD  Date of Admission: 12/09/2022  2:54 PM Date of Discharge: 12/13/2022 Attending Physician: Dr. Philipp Ovens  Discharge Diagnosis: Principal Problem:   Pulmonary embolism [facility]) Active Problems:   Tobacco use disorder   Acute respiratory failure with hypoxia (HCC)   Diarrhea   Unintentional weight loss   Acute hypoxic respiratory failure [facility])    Discharge Medications: Allergies as of 12/13/2022       Reactions   Tdap [tetanus-diphth-acell Pertussis]    Patient reports breaking out in "lesions"        Medication List     TAKE these medications    albuterol 108 (90 Base) MCG/ACT inhaler Commonly known as: VENTOLIN HFA Inhale 2 puffs into the lungs every 6 (six) hours as needed for wheezing or shortness of breath.   Breo Ellipta 100-25 MCG/ACT Aepb Generic drug: fluticasone furoate-vilanterol Inhale 1 puff into the lungs daily. Start taking on: December 14, 2022   Incruse Ellipta 62.5 MCG/ACT Aepb Generic drug: umeclidinium bromide Inhale 1 puff into the lungs daily. Start taking on: December 14, 2022   ipratropium-albuterol 0.5-2.5 (3) MG/3ML Soln Commonly known as: DUONEB Take 3 mLs by nebulization every 4 (four) hours as needed.   Xarelto Starter Pack Generic drug: Rivaroxaban Stater Pack (15 mg and 20 mg) *Start on Day 2 of Starter Pack* Take 15 mg by mouth 2 (two) times daily with a meal for 20 days. Then take 20mg  by mouth daily for 9 days. Take with food.   Xarelto 20 MG Tabs tablet Generic drug: rivaroxaban Take 1 tablet (20 mg total) by mouth daily with supper. Start taking on: January 03, 2023               Durable Medical Equipment  (From admission, onward)           Start     Ordered   12/13/22 1107  For home use only DME Other see comment  Once       Comments: Evaluate and dispense for liter flow 2 liters for poc  Question:  Length of Need  Answer:   Lifetime   12/13/22 1108   12/13/22 1028  For home use only DME oxygen  Once       Question Answer Comment  Length of Need 6 Months   Mode or (Route) Nasal cannula   Liters per Minute 6   Frequency Continuous (stationary and portable oxygen unit needed)   Oxygen delivery system Gas      12/13/22 1027            Disposition and follow-up:   Mr.[name]. was discharged from [facility] in Stable condition.  At the hospital follow up visit please address:  1.  Follow-up:  a. Acute hypoxic respiratory failure likely secondary to unprovoked subacute high risk submassive pulmonary embolism: Ensure patient remains hemodynamically stable.  Ensure patient can get oxygen if he needs.  Ensure patient has refills for Xarelto.  Evaluate for lower extremity edema.  Patient discharged on Xarelto and oxygen.    b. Chronic diarrhea: Patient was worked up for chronic diarrhea in the hospital setting.  Rule out celiac disease as well as infectious etiology.  Patient likely will need a colonoscopy in the outpatient setting.  Please follow-up stool studies as well.  Refer patient to GI.   c. Diabetes: Patient was diagnosed with diabetes in the inpatient setting.  Patient  not a good candidate for metformin or GLP-1 in the setting of GI symptoms.  Patient can get SGLT2.  Consider starting outpatient setting   d. Suspected COPD: Patient has past medical history of emphysema.  Patient has never had formal PFTs.  Ensure patient gets PFTs in outpatient setting.  Patient discharged on Breo, Incruse, as well as albuterol.  2.  Labs / imaging needed at time of follow-up: CBC, BMP   3.  Pending labs/ test needing follow-up: Barium swallow, Stool studies   4.  Medication Changes  Patient discharged on Xarelto 15 mg twice daily for the next 21 days.  Patient then will be on Xarelto 20 mg daily likely for lifetime.  Patient discharged on Breo, Incruse, and albuterol  Patient discharged  on home oxygen  Follow-up Appointments:  Follow-up Information     Antony Blackbird, MD .   Specialty: Family Medicine Contact information: [place] [address] [phone]         [facility] Follow up.   Why: home oxygen Contact information: [place]        Lacinda Axon, MD. Go on 12/19/2022.   Specialty: Internal Medicine Why: Please show up to your appointment at the internal medicine center at 1:15 PM.  Remember this is at the basement of this hospital. Contact information: [place] [address] [phone]                 Hospital Course by problem list: Erik Ward. is a 67 y.o. male with a history of subarachnoid hemorrhage presenting to the hospital with concerns of shortness of breath, diarrhea, and fatigue.  Patient admitted for further evaluation and management of acute hypoxic respiratory failure.   #Acute hypoxic respiratory failure likely secondary to unprovoked subacute high risk submassive pulmonary embolism #Subacute right heart failure #Parainfluenza infection #Suspected COPD exacerbation Patient presents with 38-month history of shortness of breath and productive cough. Patient has not been regularly followed by a physician.  Initially thought this was due to COPD exacerbation likely secondary to parainfluenza infection.  Did obtain CT chest abdomen pelvis to rule out any other underlying malignancy, found to have left main pulmonary artery embolism.  Patient was started on heparin, and transition to Xarelto.  Patient did require oxygen up to 6 L during hospitalization.  Patient to be discharged on 3 L oxygen.  Patient also followed by cardiology and pulmonology.  Patient likely will need anticoagulation lifelong.  Patient discharged on LAMA, LABA, ICS therapy.  Did not receive any thrombolytics during hospitalization.  Patient remained hemodynamically stable.   #Troponeimia  Patient had elevated  troponins initially on presentation.  This is likely due to demand in the setting of above problem.   #Chronic diarrhea Patient scented to the emergency room with concerns of chronic diarrhea.  Patient was worked up for infectious etiology as well as other autoimmune etiology.  Still working up for pancreatic insufficiency, and patient would benefit with colonoscopy in the outpatient setting.   #Erythrocytosis Patient initially presented with hemoglobin at 17.  It went back down to normal.  Erythrocytosis likely in the setting of chronic smoking.  No acute concerns.  Continue to follow CBC outpatient.  Hemoglobin elevated at 17 on arrival.  Patient is a daily smoker, and likely contributing. -Monitor CBC   #Diabetes mellitus Patient has had longstanding history of prediabetes.  A1c during hospitalization 6.8.  Patient was on sign scale insulin during hospitalization.  Continue  workup in the outpatient setting.     #History of Subarachnoid hemorrhage  Patient had past medical history of subarachnoid hemorrhage in 2013.  No acute concerns during hospitalization.  Patient is started on anticoagulation.  Continue monitor for any neurosymptoms.   Discharge Subjective:  Patient examined at bedside this morning. Patient states he is feeling better. He reports that he is still having loose bowel movements. He states he had 2 yesterday. He states he went up and down the hall with no concerns yesterday. He denies any chest pain.   Discharge Exam:   BP 110/76 (BP Location: Left Arm)   Pulse 66   Temp 98.6 F (37 C) (Oral)   Resp 18   Ht 5\' 7"  (1.702 m)   Wt 63.4 kg   SpO2 92%   BMI 21.89 kg/m  Constitutional: Resting in bed, no acute distress HENT: normocephalic atraumatic Eyes: conjunctiva non-erythematous Cardiovascular: regular rate and rhythm, no m/r/g Pulmonary/Chest: Patient currently on 3.5 L nasal cannula.  No increased work of breathing appreciated.  Expiratory wheezing noted on  exam Abdomen: Hyperactive bowel sounds appreciated Extremities: Trace edema noted bilaterally  Pertinent Labs, Studies, and Procedures:     Latest Ref Rng & Units 12/13/2022   12:11 AM 12/12/2022   12:38 AM 12/11/2022   12:24 AM  CBC  WBC 4.0 - 10.5 K/uL 4.9  4.9  5.8   Hemoglobin 13.0 - 17.0 g/dL 14.8  15.4  15.6   Hematocrit 39.0 - 52.0 % 46.4  48.8  49.3   Platelets 150 - 400 K/uL 283  270  275        Latest Ref Rng & Units 12/13/2022   12:11 AM 12/12/2022   12:38 AM 12/11/2022   12:24 AM  CMP  Glucose 70 - 99 mg/dL 120  105  138   BUN 8 - 23 mg/dL 17  11  14    Creatinine 0.61 - 1.24 mg/dL 0.90  0.88  0.85   Sodium 135 - 145 mmol/L 138  135  138   Potassium 3.5 - 5.1 mmol/L 4.0  4.4  3.7   Chloride 98 - 111 mmol/L 94  97  97   CO2 22 - 32 mmol/L 34  33  34   Calcium 8.9 - 10.3 mg/dL 8.4  8.4  8.5     ECHOCARDIOGRAM COMPLETE  Result Date: 12/10/2022    ECHOCARDIOGRAM REPORT   Patient Name:   Traveion Rotundo. Date of Exam: 12/10/2022 Medical Rec #:  [id number]           Height:       67.0 in Accession #:    [id number]          Weight:       150.0 lb Date of Birth:  25-Jul-1956           BSA:          1.790 m Patient Age:    22 years            BP:           100/75 mmHg Patient Gender: M                   HR:           80 bpm. Exam Location:  Inpatient Procedure: 2D Echo, Cardiac Doppler and Color Doppler Indications:    respiratory distress  History:        Patient has prior history of Echocardiogram examinations.  Risk                 Factors:Current Smoker.  Sonographer:    Danne Baxter RDCS, FE, PE Referring Phys: [id number] Velna Ochs  Sonographer Comments: Technically difficult study due to poor echo windows. IMPRESSIONS  1. Left ventricular ejection fraction, by estimation, is 55 to 60%. The left ventricle has normal function. The left ventricle has no regional wall motion abnormalities. Left ventricular diastolic parameters are indeterminate. There is the interventricular  septum is flattened in systole, consistent with right ventricular pressure overload.  2. Right ventricular systolic function is normal. The right ventricular size is severely enlarged. There is normal pulmonary artery systolic pressure, but this may due to equalization of pressures. Study suggestive of Cor Pulmonale.  3. Left atrial size was severely dilated.  4. The mitral valve is grossly normal. No evidence of mitral valve regurgitation.  5. The aortic valve was not well visualized. Aortic valve regurgitation is not visualized.  6. The inferior vena cava is dilated in size with <50% respiratory variability, suggesting right atrial pressure of 15 mmHg. Comparison(s): No prior Echocardiogram. FINDINGS  Left Ventricle: Left ventricular ejection fraction, by estimation, is 55 to 60%. The left ventricle has normal function. The left ventricle has no regional wall motion abnormalities. The left ventricular internal cavity size was normal in size. There is  no left ventricular hypertrophy. The interventricular septum is flattened in systole, consistent with right ventricular pressure overload. Left ventricular diastolic parameters are indeterminate. Right Ventricle: The right ventricular size is severely enlarged. No increase in right ventricular wall thickness. Right ventricular systolic function is normal. There is normal pulmonary artery systolic pressure. The tricuspid regurgitant velocity is 2.16 m/s, and with an assumed right atrial pressure of 15 mmHg, the estimated right ventricular systolic pressure is [id number] mmHg. Left Atrium: Left atrial size was severely dilated. Right Atrium: Right atrial size was normal in size. Pericardium: Trivial pericardial effusion is present. The pericardial effusion is anterior to the right ventricle. Mitral Valve: The mitral valve is grossly normal. No evidence of mitral valve regurgitation. Tricuspid Valve: The tricuspid valve is normal in structure. Tricuspid valve regurgitation is  mild. Aortic Valve: The aortic valve was not well visualized. Aortic valve regurgitation is not visualized. Pulmonic Valve: The pulmonic valve was not well visualized. Pulmonic valve regurgitation is trivial. Aorta: The aortic root is normal in size and structure. Venous: The inferior vena cava is dilated in size with less than 50% respiratory variability, suggesting right atrial pressure of 15 mmHg. IAS/Shunts: No atrial level shunt detected by color flow Doppler.  LEFT VENTRICLE PLAX 2D LVIDd:         4.00 cm   Diastology LVIDs:         2.40 cm   LV e' medial:    4.51 cm/s LV PW:         1.00 cm   LV E/e' medial:  11.6 LV IVS:        0.80 cm   LV e' lateral:   7.15 cm/s LVOT diam:     1.60 cm   LV E/e' lateral: 7.3 LV SV:         25 LV SV Index:   14 LVOT Area:     2.01 cm  RIGHT VENTRICLE RV S prime:     10.60 cm/s TAPSE (M-mode): 1.8 cm LEFT ATRIUM           Index        RIGHT ATRIUM  Index LA diam:      3.30 cm 1.84 cm/m   RA Area:     31.90 cm LA Vol (A4C): 44.2 ml 24.70 ml/m  RA Volume:   141.00 ml 78.79 ml/m  AORTIC VALVE LVOT Vmax:   96.70 cm/s LVOT Vmean:  53.800 cm/s LVOT VTI:    0.124 m  AORTA Ao Root diam: 3.30 cm MITRAL VALVE               TRICUSPID VALVE MV Area (PHT): 3.63 cm    TR Peak grad:   18.7 mmHg MV Decel Time: 209 msec    TR Vmax:        216.00 cm/s MV E velocity: 52.50 cm/s MV A velocity: 70.00 cm/s  SHUNTS MV E/A ratio:  0.75        Systemic VTI:  0.12 m                            Systemic Diam: 1.60 cm Rudean Haskell MD Electronically signed by Rudean Haskell MD Signature Date/Time: 12/10/2022/12:45:47 PM    Final    DG Chest Port 1 View  Result Date: 12/09/2022 CLINICAL DATA:  Shortness of breath EXAM: PORTABLE CHEST 1 VIEW COMPARISON:  None FINDINGS: No pleural effusion. No pneumothorax. No focal airspace opacity. Normal cardiac and mediastinal contours. No radiographically apparent displaced rib fractures. Visualized upper abdomen is unremarkable.  IMPRESSION: No acute cardiopulmonary disease. Electronically Signed   By: Marin Roberts M.D.   On: 12/09/2022 16:21     Discharge Instructions: Discharge Instructions     Call MD for:  difficulty breathing, headache or visual disturbances   Complete by: As directed    Call MD for:  persistant dizziness or light-headedness   Complete by: As directed    Call MD for:  redness, tenderness, or signs of infection (pain, swelling, redness, odor or green/yellow discharge around incision site)   Complete by: As directed    Call MD for:  temperature >100.4   Complete by: As directed    Diet - low sodium heart healthy   Complete by: As directed    Increase activity slowly   Complete by: As directed       Mr. Amol Meah, It was a pleasure taking care of you at [facility] were admitted for pulmonary embolism and and treated with blood thinners.  We are discharging you home now that you are doing better. Please follow the following instructions.   1) Regarding your outpatient medications, I have changed many of your medications.  First of all, you will be on a blood thinner.  For the first 21 days I want you to take 15 mg of Xarelto twice daily, 1 time in the morning, 1 time at night.  After the first 21 days, I want you to take 20 mg of Xarelto daily for the rest of your life.  Please never miss a dose, as this can put you at risk for further clots.  2) Regarding your oxygen, we are sending you home on 3 L of oxygen, please wear this at home with activity and at night.  As your clot dissolves, you should start feeling better and your oxygen requirement should decrease.  3) I suspect you have COPD given your long history of smoking and imaging findings.  I am discharging you on inhalers. One is called Incruse, please take this daily.  The other is called Memory Dance,  please take that daily as well.  There is another one named albuterol, please take this when you feel very short of breath  and start wheezing as this is your rescue inhaler.  4) I have a scheduled you for an appointment in the internal medicine center.  It is located in the place of this hospital.  Please ensure that you go to this appointment.  It is at 115pm on 12/19/2022.  This is a Thursday.  Please show up to this appointment.  At that time, please asked the doctor to talk to about colonoscopy and pulmonary function test.  5) You have been diagnosed with diabetes during your hospitalization.  Please continue to follow this up outpatient.  6) Regarding your other stool studies, we will follow this up outpatient.  We will also follow-up your swallow study outpatient.  Take care,  Dr. Leigh Aurora, DO   Signed: Leigh Aurora, DO 12/13/2022, 4:54 PM   Pager: [phone]

## 2022-12-13 NOTE — Progress Notes (Signed)
HD#3 Subjective:   Summary: Erik Daws. is a 67 y.o. male with a history of subarachnoid hemorrhage presenting to the hospital with concerns of shortness of breath, diarrhea, and fatigue.  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  Overnight Events: No overnight events  Patient examined at bedside this morning.  Patient states he is feeling better.  He reports that he is still having loose bowel movements.  He states he had 2 yesterday.  He states he went up and down the hall with no concerns yesterday.  He denies any chest pain.  Objective:  Vital signs in last 24 hours: Vitals:   12/12/22 1956 12/13/22 0018 12/13/22 0529 12/13/22 0732  BP: 98/70 95/68 101/69 110/76  Pulse: 79 75 64 66  Resp: 19 18 18 18   Temp: 98.2 F (36.8 C) 98.3 F (36.8 C) (!) 97.5 F (36.4 C) 98.6 F (37 C)  TempSrc: Oral Oral Oral Oral  SpO2: 91% 94% 95% 92%  Weight:   63.4 kg   Height:       Supplemental O2: Nasal Cannula SpO2: 92 % O2 Flow Rate (L/min): 3 L/min   Physical Exam:  Constitutional: Resting in bed, no acute distress HENT: normocephalic atraumatic Eyes: conjunctiva non-erythematous Cardiovascular: regular rate and rhythm, no m/r/g Pulmonary/Chest: Patient currently on 3.5 L nasal cannula.  No increased work of breathing appreciated.  Expiratory wheezing noted on exam Abdomen: Hyperactive bowel sounds appreciated Extremities: Trace edema noted bilaterally  Filed Weights   12/11/22 0338 12/12/22 0041 12/13/22 0529  Weight: 62.6 kg 62.1 kg 63.4 kg     Intake/Output Summary (Last 24 hours) at 12/13/2022 1326 Last data filed at 12/13/2022 0815 Gross per 24 hour  Intake 940 ml  Output 901 ml  Net 39 ml   Net IO Since Admission: -1,469.97 mL [12/13/22 1326]  Pertinent Labs:    Latest Ref Rng & Units 12/13/2022   12:11 AM 12/12/2022   12:38  AM 12/11/2022   12:24 AM  CBC  WBC 4.0 - 10.5 K/uL 4.9  4.9  5.8   Hemoglobin 13.0 - 17.0 g/dL 14.8  15.4  15.6   Hematocrit 39.0 - 52.0 % 46.4  48.8  49.3   Platelets 150 - 400 K/uL 283  270  275        Latest Ref Rng & Units 12/13/2022   12:11 AM 12/12/2022   12:38 AM 12/11/2022   12:24 AM  CMP  Glucose 70 - 99 mg/dL 120  105  138   BUN 8 - 23 mg/dL 17  11  14    Creatinine 0.61 - 1.24 mg/dL 0.90  0.88  0.85   Sodium 135 - 145 mmol/L 138  135  138   Potassium 3.5 - 5.1 mmol/L 4.0  4.4  3.7   Chloride 98 - 111 mmol/L 94  97  97   CO2 22 - 32 mmol/L 34  33  34   Calcium 8.9 - 10.3 mg/dL 8.4  8.4  8.5     Imaging: No results found.  Assessment/Plan:   Principal Problem:   Pulmonary embolism (HCC) Active Problems:   Tobacco use disorder   Acute respiratory failure with hypoxia (HCC)   Diarrhea   Unintentional weight loss   Acute hypoxic respiratory failure [facility])  Patient Summary: Erik Zambrana. is a 67 y.o. male with a history of subarachnoid hemorrhage presenting  to the hospital with concerns of shortness of breath, diarrhea, and fatigue.  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   #Acute hypoxic respiratory failure likely secondary to unprovoked subacute high risk submassive pulmonary embolism #Subacute right heart failure #Parainfluenza infection #Suspected COPD exacerbation Patient evaluated today.  Patient reports that his shortness of breath is improving.  He states he has been up and moving with no concerns.  He denies any chest pain.  On exam, I do appreciate some expiratory wheezing noted throughout lung fields.  Steroids were stopped yesterday, as anticipated hypoxic respiratory failure secondary to submassive PE.  I do believe that hypoxic respiratory failure likely multifactorial in setting of PE parainfluenza infection as well as  COPD exacerbation.  Will restart steroids today.  Will also optimize preventative medications with LAMA, LABA, and ICS.  Patient will benefit with PFTs in the outpatient setting.  Patient will likely need to remain on Xarelto for lifetime.  I do anticipate heart failure will start to improve as clot burden decreases. -Restart prednisone 40 mg daily -Start Incruse and Breo -DuoNebs as needed -Continue Xarelto -Cardiology following, appreciate recs -PCCM following, appreciate recs -Strict I's and O's, daily weights -Continue to wean oxygen as tolerated, do anticipate patient will likely need oxygen in the outpatient setting while clot burden decreases, keep O2 goal between 88 to 92% -Patient will benefit with PFTs in the outpatient setting   #Chronic diarrhea #Weight loss Patient remains with loose stools.  Celiac studies negative.  GI panel negative.  Still awaiting fecal calprotectin and pancreatic elastase.  Differential includes pancreatic insufficiency versus potentially microscopic colitis.  This could also be symptoms of acute right heart failure.  There is also concern for chronic aspiration syndrome noted on CT.  Patient will get evaluated by speech therapy today with MBSS.  -MBSS study pending -Follow-up labs   #Metabolic alkalosis, resolving Bicarb at 34 today.  Holding diuresis is improving alkalosis.  This likely elevated in the setting of chronic CO2 retention. -Repeat BMP in a.m  #Diabetes A1c 6.8.  Patient diagnosed with diabetes.  Patient remains on sliding scale.  Given GI symptoms, not a good candidate for metformin. -Continue monitoring a.m. blood sugars   #History of Subarachnoid hemorrhage  Patient does have history of subarachnoid hemorrhage.  Will need to monitor for neuro symptoms as patient is on anticoagulation. -Continue to monitor for neuro symptoms  Diet: Heart Healthy VTE: Enoxaparin IVF: None,None Code: Full  Dispo: Anticipated discharge to Home in 1  days pending clinical improvement.   [facility] Resident PGY-1 [phone] Please contact the on call pager after 5 pm and on weekends at [phone].

## 2022-12-13 NOTE — Consult Note (Signed)
   [facility] CM Inpatient Consult   12/13/2022  Erik Ward June 30, 1956 [id number]  Orientation with Natividad Brood, [facility] Liaison for review.   Location: [facility]).   Update Plan:  Referral requested for [facility] community care coordination: New onset Heart Failure.    [facility] does not replace or interfere with any arrangements made by the Inpatient Transition of Care team.   For questions contact:    Raina Mina, RN, [facility] Hours M-F 8:00 am to 5 pm [phone] mobile [phone] [Office toll free line][facility] Office Hours are M-F 8:30 - 5 pm 24 hour nurse advise line [phone] Conceirge  [email or web address]

## 2022-12-13 NOTE — Evaluation (Signed)
Clinical/Bedside Swallow Evaluation Patient Details  Name: Erik Ward. MRN: [id number] Date of Birth: [birth year]  Today's Date: 12/13/2022 Time: SLP Start Time (ACUTE ONLY): [id number] SLP Stop Time (ACUTE ONLY): 0942 SLP Time Calculation (min) (ACUTE ONLY): 6 min  Past Medical History:  Past Medical History:  Diagnosis Date   Subarachnoid hemorrhage following injury, with loss of consciousness ([place]) 09/18/2012   syncope and collapse (09/18/2012)   Syncope and collapse 09/18/2012   w/loss of consciousness (09/18/2012)   Past Surgical History:  Past Surgical History:  Procedure Laterality Date   PERCUTANEOUS PINNING PHALANX FRACTURE OF HAND  1980's   "right" (09/18/2012)   HPI:  Erik Ward. is a 67 y.o. male who presented to the hospital with concerns of shortness of breath, diarrhea, and fatigue.  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 Chest CT 3/13 with BLL opacities R>L. Pt has had increased O2 requirements.    Assessment / Plan / Recommendation  Clinical Impression  Pt presents with functional swallowing as assessed clinically.  Pt tolerated all consistencies trialed with no clinical s/s of aspiration and exhibited excellent oral clearance of solids.  Pt denies dysphagia symptoms. Pt has been having increased O2 requirements.  Will reach out to team to determine desire for instrumental assessment (SLP received incomplete MBS order set).  If MBS indicated, it can likely be completed this afternoon.   Recommend continuing regular texture diet with thin liquids.  SLP Visit Diagnosis: Dysphagia, unspecified (R13.10)    Aspiration Risk  Mild aspiration risk    Diet Recommendation Regular;Thin liquid   Liquid Administration via: Straw;Cup Medication Administration: Whole meds with liquid Supervision: Patient able to self  feed Compensations: Slow rate;Small sips/bites Postural Changes: Seated upright at 90 degrees    Other  Recommendations Oral Care Recommendations: Oral care BID    Recommendations for follow up therapy are one component of a multi-disciplinary discharge planning process, led by the attending physician.  Recommendations may be updated based on patient status, additional functional criteria and insurance authorization.  Follow up Recommendations  (TBD)      Assistance Recommended at Discharge    Functional Status Assessment  (TBD)  Frequency and Duration  (TBD)          Prognosis Prognosis for improved oropharyngeal function:  (N/A)      Swallow Study   General HPI: Erik Ward. is a 67 y.o. male who presented to the hospital with concerns of shortness of breath, diarrhea, and fatigue.  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 Chest CT 3/13 with BLL opacities R>L. Pt has had increased O2 requirements. Type of Study: Bedside Swallow Evaluation Previous Swallow Assessment: None Diet Prior to this Study: Regular;Thin liquids (Level 0) Temperature Spikes Noted: No Respiratory Status: Nasal cannula History of Recent Intubation: No Behavior/Cognition: Alert;Cooperative;Pleasant mood Oral Cavity Assessment: Within Functional Limits Oral Care Completed by SLP: No Oral Cavity - Dentition: Dentures, bottom;Dentures, top Vision: Functional for self-feeding Self-Feeding Abilities: Able to feed self Patient Positioning: Upright in bed Baseline Vocal Quality: Normal Volitional Cough: Strong Volitional Swallow: Able to elicit    Oral/Motor/Sensory Function Overall Oral Motor/Sensory Function: Within functional limits   Ice Chips Ice chips: Not tested   Thin Liquid Thin Liquid: Within functional limits Presentation: Straw    Nectar Thick Nectar  Thick  Liquid: Not tested   Honey Thick Honey Thick Liquid: Not tested   Puree Puree: Within functional limits Presentation: Spoon   Solid     Solid: Within functional limits Presentation: Erik Ward 12/13/2022,9:49 AM

## 2022-12-13 NOTE — TOC Transition Note (Addendum)
Transition of Care [facility]) - CM/SW Discharge Note   Patient Details  Name: Erik Ward. MRN: [id number] Date of Birth: Dec 07, 1955  [facility] CM/SW Contact:  Zenon Mayo, RN Phone Number: 12/13/2022, 2:43 PM   Clinical Narrative:    Patient is for dc today, he has oxygen tank in room with him thru [facility] [place], they will also set up concentrator at patient's home.  Patient's family will transport him home today.  Will be on xarelto, has zero dollar copay.  TOC to fill meds.    Final next level of care: Home/Self Care Barriers to Discharge: No Barriers Identified   Patient Goals and CMS Choice CMS Medicare.gov Compare Post Acute Care list provided to:: Patient    Discharge Placement                         Discharge Plan and Services Additional resources added to the After Visit Summary for                  DME Arranged: Oxygen DME Agency: [facility] Date DME Agency Contacted: 12/13/22 Time DME Agency Contacted: 0900 Representative spoke with at DME Agency: Brenton Grills HH Arranged: NA          Social Determinants of Health ([place]) Interventions SDOH Screenings   Food Insecurity: No Food Insecurity (12/10/2022)  Housing: Low Risk  (12/10/2022)  Transportation Needs: No Transportation Needs (12/10/2022)  Utilities: Not At Risk (12/10/2022)  Depression (PHQ2-9): Low Risk  (06/29/2020)  Tobacco Use: High Risk (12/09/2022)     Readmission Risk Interventions     No data to display

## 2022-12-16 ENCOUNTER — Telehealth: Payer: Self-pay

## 2022-12-16 NOTE — Transitions of Care (Post Inpatient/ED Visit) (Signed)
   12/16/2022  Name: Erik Ward. MRN: [id number] DOB: 05-19-1956  Today's TOC FU Call Status: Today's TOC FU Call Status:: Unsuccessul Call (1st Attempt) Unsuccessful Call (1st Attempt) Date: 12/16/22  Attempted to reach the patient regarding the most recent Inpatient/ED visit.  Follow Up Plan: Additional outreach attempts will be made to reach the patient to complete the Transitions of Care (Post Inpatient/ED visit) call.   [name], RN, BSN, CCM Care Management Coordinator [place] Phone: [phone]: [phone]

## 2022-12-17 ENCOUNTER — Telehealth: Payer: Self-pay

## 2022-12-17 NOTE — Transitions of Care (Post Inpatient/ED Visit) (Signed)
   12/17/2022  Name: Erik Ward. MRN: [id number] DOB: 04-13-1956  Today's TOC FU Call Status:    Attempted to reach the patient regarding the most recent Inpatient/ED visit.  Follow Up Plan: Additional outreach attempts will be made to reach the patient to complete the Transitions of Care (Post Inpatient/ED visit) call.   [name], RN, BSN, CCM Care Management Coordinator [place] Phone: [phone]: [phone]

## 2022-12-18 ENCOUNTER — Telehealth: Payer: Self-pay

## 2022-12-18 NOTE — Transitions of Care (Post Inpatient/ED Visit) (Signed)
   12/18/2022  Name: Erik Ward. MRN: [id number] DOB: 1956/01/23  Today's TOC FU Call Status: Today's TOC FU Call Status:: Unsuccessful Call (3rd Attempt) Unsuccessful Call (3rd Attempt) Date: 12/18/22  Attempted to reach the patient regarding the most recent Inpatient/ED visit.  Follow Up Plan: No further outreach attempts will be made at this time. We have been unable to contact the patient.  [name], RN, BSN, CCM Care Management Coordinator [place] Phone: [phone]: [phone]

## 2022-12-19 ENCOUNTER — Ambulatory Visit (INDEPENDENT_AMBULATORY_CARE_PROVIDER_SITE_OTHER): Payer: 59

## 2022-12-19 ENCOUNTER — Encounter: Payer: Self-pay | Admitting: Student

## 2022-12-19 ENCOUNTER — Ambulatory Visit (INDEPENDENT_AMBULATORY_CARE_PROVIDER_SITE_OTHER): Payer: 59 | Admitting: Student

## 2022-12-19 VITALS — BP 139/86 | HR 82 | Temp 97.9°F | Ht 67.0 in | Wt 135.4 lb

## 2022-12-19 DIAGNOSIS — Z0000 Encounter for general adult medical examination without abnormal findings: Secondary | ICD-10-CM

## 2022-12-19 DIAGNOSIS — Z8679 Personal history of other diseases of the circulatory system: Secondary | ICD-10-CM

## 2022-12-19 DIAGNOSIS — R197 Diarrhea, unspecified: Secondary | ICD-10-CM

## 2022-12-19 DIAGNOSIS — J9601 Acute respiratory failure with hypoxia: Secondary | ICD-10-CM

## 2022-12-19 DIAGNOSIS — Z86711 Personal history of pulmonary embolism: Secondary | ICD-10-CM | POA: Diagnosis not present

## 2022-12-19 DIAGNOSIS — E119 Type 2 diabetes mellitus without complications: Secondary | ICD-10-CM | POA: Diagnosis not present

## 2022-12-19 NOTE — Patient Instructions (Signed)
Health Maintenance, Male Adopting a healthy lifestyle and getting preventive care are important in promoting health and wellness. Ask your health care provider about: The right schedule for you to have regular tests and exams. Things you can do on your own to prevent diseases and keep yourself healthy. What should I know about diet, weight, and exercise? Eat a healthy diet  Eat a diet that includes plenty of vegetables, fruits, low-fat dairy products, and lean protein. Do not eat a lot of foods that are high in solid fats, added sugars, or sodium. Maintain a healthy weight Body mass index (BMI) is a measurement that can be used to identify possible weight problems. It estimates body fat based on height and weight. Your health care provider can help determine your BMI and help you achieve or maintain a healthy weight. Get regular exercise Get regular exercise. This is one of the most important things you can do for your health. Most adults should: Exercise for at least 150 minutes each week. The exercise should increase your heart rate and make you sweat (moderate-intensity exercise). Do strengthening exercises at least twice a week. This is in addition to the moderate-intensity exercise. Spend less time sitting. Even light physical activity can be beneficial. Watch cholesterol and blood lipids Have your blood tested for lipids and cholesterol at 68 years of age, then have this test every 5 years. You may need to have your cholesterol levels checked more often if: Your lipid or cholesterol levels are high. You are older than 68 years of age. You are at high risk for heart disease. What should I know about cancer screening? Many types of cancers can be detected early and may often be prevented. Depending on your health history and family history, you may need to have cancer screening at various ages. This may include screening for: Colorectal cancer. Prostate cancer. Skin cancer. Lung  cancer. What should I know about heart disease, diabetes, and high blood pressure? Blood pressure and heart disease High blood pressure causes heart disease and increases the risk of stroke. This is more likely to develop in people who have high blood pressure readings or are overweight. Talk with your health care provider about your target blood pressure readings. Have your blood pressure checked: Every 3-5 years if you are 18-39 years of age. Every year if you are 40 years old or older. If you are between the ages of 65 and 75 and are a current or former smoker, ask your health care provider if you should have a one-time screening for abdominal aortic aneurysm (AAA). Diabetes Have regular diabetes screenings. This checks your fasting blood sugar level. Have the screening done: Once every three years after age 45 if you are at a normal weight and have a low risk for diabetes. More often and at a younger age if you are overweight or have a high risk for diabetes. What should I know about preventing infection? Hepatitis B If you have a higher risk for hepatitis B, you should be screened for this virus. Talk with your health care provider to find out if you are at risk for hepatitis B infection. Hepatitis C Blood testing is recommended for: Everyone born from 1945 through 1965. Anyone with known risk factors for hepatitis C. Sexually transmitted infections (STIs) You should be screened each year for STIs, including gonorrhea and chlamydia, if: You are sexually active and are younger than 68 years of age. You are older than 68 years of age and your   health care provider tells you that you are at risk for this type of infection. Your sexual activity has changed since you were last screened, and you are at increased risk for chlamydia or gonorrhea. Ask your health care provider if you are at risk. Ask your health care provider about whether you are at high risk for HIV. Your health care provider  may recommend a prescription medicine to help prevent HIV infection. If you choose to take medicine to prevent HIV, you should first get tested for HIV. You should then be tested every 3 months for as long as you are taking the medicine. Follow these instructions at home: Alcohol use Do not drink alcohol if your health care provider tells you not to drink. If you drink alcohol: Limit how much you have to 0-2 drinks a day. Know how much alcohol is in your drink. In the [place] one drink equals one 12 oz bottle of beer (355 mL), one 5 oz glass of wine (148 mL), or one 1 oz glass of hard liquor (44 mL). Lifestyle Do not use any products that contain nicotine or tobacco. These products include cigarettes, chewing tobacco, and vaping devices, such as e-cigarettes. If you need help quitting, ask your health care provider. Do not use street drugs. Do not share needles. Ask your health care provider for help if you need support or information about quitting drugs. General instructions Schedule regular health, dental, and eye exams. Stay current with your vaccines. Tell your health care provider if: You often feel depressed. You have ever been abused or do not feel safe at home. Summary Adopting a healthy lifestyle and getting preventive care are important in promoting health and wellness. Follow your health care provider's instructions about healthy diet, exercising, and getting tested or screened for diseases. Follow your health care provider's instructions on monitoring your cholesterol and blood pressure. This information is not intended to replace advice given to you by your health care provider. Make sure you discuss any questions you have with your health care provider. Document Revised: 02/05/2021 Document Reviewed: 02/05/2021 Elsevier Patient Education  2023 Elsevier Inc.  

## 2022-12-19 NOTE — Progress Notes (Signed)
SATURATION QUALIFICATIONS: (This note is used to comply with regulatory documentation for home oxygen)  Patient Saturations on Room Air at Rest = 86%  Patient Saturations on Room Air while Ambulating = 81%  Patient Saturations on 3 Liters of oxygen while Ambulating = 97%  Please briefly explain why patient needs home oxygen:

## 2022-12-19 NOTE — Progress Notes (Signed)
CC: Hospital follow-up  HPI:  Mr.Erik Ward. is a 67 y.o. male with PMH as below presents to clinic for hospital follow-up after recent hospitalization for acute hypoxic respiratory failure. Please see problem based charting for evaluation, assessment and plan.  Past Medical History:  Diagnosis Date   Subarachnoid hemorrhage following injury, with loss of consciousness ([place]) 09/18/2012   syncope and collapse (09/18/2012)   Syncope and collapse 09/18/2012   w/loss of consciousness (09/18/2012)   Family History  Problem Relation Age of Onset   Hypertension Mother    Colon cancer Father    Social History   Socioeconomic History   Marital status: Married    Spouse name: Not on file   Number of children: Not on file   Years of education: Not on file   Highest education level: Not on file  Occupational History   Not on file  Tobacco Use   Smoking status: Every Day    Packs/day: 0.50    Years: 40.00    Additional pack years: 0.00    Total pack years: 20.00    Types: Cigarettes   Smokeless tobacco: Never   Tobacco comments:    09/18/2012 offered smoking cessation materials; pt declines  Vaping Use   Vaping Use: Never used  Substance and Sexual Activity   Alcohol use: No   Drug use: No   Sexual activity: Not Currently  Other Topics Concern   Not on file  Social History Narrative   Not on file   Social Determinants of Health   Financial Resource Strain: Low Risk  (12/19/2022)   Overall Financial Resource Strain (CARDIA)    Difficulty of Paying Living Expenses: Not hard at all  Food Insecurity: No Food Insecurity (12/19/2022)   Hunger Vital Sign    Worried About Running Out of Food in the Last Year: Never true    Ran Out of Food in the Last Year: Never true  Transportation Needs: No Transportation Needs (12/19/2022)   PRAPARE - Hydrologist (Medical): No    Lack of Transportation (Non-Medical): No  Physical Activity: Inactive  (12/19/2022)   Exercise Vital Sign    Days of Exercise per Week: 0 days    Minutes of Exercise per Session: 0 min  Stress: No Stress Concern Present (12/19/2022)   [place]    Feeling of Stress : Not at all  Social Connections: Moderately Isolated (12/19/2022)   Social Connection and Isolation Panel [NHANES]    Frequency of Communication with Friends and Family: More than three times a week    Frequency of Social Gatherings with Friends and Family: More than three times a week    Attends Religious Services: Never    Marine scientist or Organizations: No    Attends Archivist Meetings: Never    Marital Status: Married  Human resources officer Violence: Not At Risk (12/19/2022)   Humiliation, Afraid, Rape, and Kick questionnaire    Fear of Current or Ex-Partner: No    Emotionally Abused: No    Physically Abused: No    Sexually Abused: No    Review of Systems:  Constitutional: Negative for fever or fatigue HEENT: Positive for occasional cough Respiratory: Positive for dyspnea on exertion. Cardiac: Negative for chest pain Abdomen: Negative for abdominal pain, constipation or diarrhea Neuro: Negative for headache, dizziness or weakness  Physical Exam: General: Pleasant, well-appearing thin elderly man. No acute distress. Cardiac: RRR.  No murmurs, rubs or gallops. No LE edema Respiratory: On 3 L [place]. Lungs CTAB. No wheezing or crackles.  Decreased air movement throughout. Abdominal: Soft, symmetric and non tender. Normal BS. Skin: Warm, dry and intact without rashes or lesions Extremities: Atraumatic. Full ROM. Clubbing of the fingernails.  Neuro: A&O x 3. Moves all extremities.  Normal sensation to gross touch Psych: Appropriate mood and affect.  Vitals:   12/19/22 1342  BP: 139/86  Pulse: 82  Temp: 97.9 F (36.6 C)  TempSrc: Oral  Weight: 135 lb 6.4 oz (61.4 kg)  Height: 5\' 7"  (1.702 m)     Assessment & Plan:   Acute hypoxic respiratory failure (HCC) Patient was recently admitted from 3/11-3/15 for acute hypoxic respiratory failure secondary to possible COPD exacerbation from parainfluenza virus infection and found to have pulmonary embolism in the left main pulmonary artery with right heart strain. TTE showed EF 55-60%, severely dilated LA, RV pressure overload and signs of cor pulmonale. Patient's PE was managed with IV heparin and transition to Xarelto starter pack at discharge. Patient required up to 6 L of oxygen which was titrated down to 3 L by time of discharge. He remains on 3 L but reports that he does not put it on while at home.  Since his hospitalization, patient has not significantly exerted himself. He has been adherent to his Xarelto and denies any signs of bleeding. Patient's PE was thought to be unprovoked so he will likely require an hypercoagulability workup once he is more stable.  On exam, patient with evidence of clubbing and decreased air movement however he has no wheezing or rales. Patient has cool extremities which makes his SpO2 less accurate however we attempted to walk patient with and without oxygen and his SpO2 dropped to 81% on room air while walking. This improved to 97% on 3 L. Patient was advised to wear his oxygen at all times and follow-up with pulmonology next month for PFT to formally diagnose his COPD.  Plan: -Continue Xarelto 15 mg twice daily until April 4th, then transition to 20 mg daily -Continue O2 supplementation and inhalers -Follow-up with pulmonology as scheduled on 01/17/2023  History of pulmonary embolism Patient was recently admitted from 3/11-3/15 for acute hypoxic respiratory failure secondary to possible COPD exacerbation from parainfluenza virus infection and found to have pulmonary embolism in the left main pulmonary artery with right heart strain. TTE showed EF 55-60%, severely dilated LA, RV pressure overload and signs of cor  pulmonale. Patient's PE was managed with IV heparin and transition to Xarelto starter pack at discharge. Patient required up to 6 L of oxygen which was titrated down to 3 L by time of discharge. He remains on 3 L but reports that he does not put it on while at home.  Since his hospitalization, patient has not significantly exerted himself. He has been adherent to his Xarelto and denies any signs of bleeding. Patient's PE was thought to be unprovoked so he will likely require an hypercoagulability workup once he is more stable.  On exam, patient with evidence of clubbing and decreased air movement however he has no wheezing or rales. Patient has cool extremities which makes his SpO2 less accurate however we attempted to walk patient with and without oxygen and his SpO2 dropped to 81% on room air while walking. This improved to 97% on 3 L. Patient was advised to wear his oxygen at all times and follow-up with pulmonology next month for PFT to formally diagnose  his COPD.  Plan: -Continue Xarelto 15 mg twice daily until April 4th, then transition to 20 mg daily -Continue O2 supplementation and inhalers -Follow-up with pulmonology as scheduled on 01/17/2023  History of subarachnoid hemorrhage Patient presented to the ED in December 2013 after a syncopal episode while getting out of his car. A CT head showed mild amount of subarachnoid hemorrhage over the convexity of the left and a small subdural hygroma on the left frontal area. A repeat CT head prior to discharge showed resolving on the subarachnoid blood and patient was cleared by neurosurgery.  Type 2 diabetes mellitus ([place]) During his recent hospitalization patient was found to have A1c of 6.8.  Patient counseled on lifestyle modifications and advised to decrease intake of foods high in carbs and sugar. -Lifestyle modifications with repeat A1c in 3 months.  Diarrhea Patient reports that his diarrhea has resolved.  His stool studies were unrevealing.    See Encounters Tab for problem based charting.  Patient discussed with Dr. Lockie Pares, MD, MPH

## 2022-12-19 NOTE — Patient Instructions (Signed)
Thank you, Mr.Lenix Darrow Bussing. for allowing [place] to provide your care today. Today we discussed your recent hospitalization and establish you in our clinic.  I am glad your diarrhea has resolved.  Continue using your inhalers at hom and continue while your oxygen at home.  Follow-up with your pulmonologist as scheduled.  Continue taking your Xarelto 10 mg twice daily until April 4th then switch to Xarelto 20 mg daily  For your diabetes, continue working on diet and exercise  My Chart Access: [email or web address]  Please follow-up in 2-3 months  Please make sure to arrive 15 minutes prior to your next appointment. If you arrive late, you may be asked to reschedule.    We look forward to seeing you next time. Please call our clinic at [phone] if you have any questions or concerns. The best time to call is Monday-Friday from 9am-4pm, but there is someone available 24/7. If after hours or the weekend, call the main hospital number and ask for the Internal Medicine Resident On-Call. If you need medication refills, please notify your pharmacy one week in advance and they will send [place] a request.   Thank you for letting us take part in your care. Wishing you the best!  Lacinda Axon, MD 12/19/2022, 2:31 PM IM Resident, PGY-3 [facility]al[facility] Milian 41:10

## 2022-12-19 NOTE — Progress Notes (Addendum)
Subjective:   Erik Ward. is a 67 y.o. male who presents for an Initial Medicare Annual Wellness Visit.  Review of Systems    Defer to PCP.        Objective:    Today's Vitals   12/19/22 1454 12/19/22 1456  BP: 139/86   Pulse: 82   Temp: 97.9 F (36.6 C)   TempSrc: Oral   SpO2: (!) 86% 90%  Weight: 135 lb 6.4 oz (61.4 kg)   Height: 5\' 7"  (1.702 m)   PainSc:  0-No pain   Body mass index is 21.21 kg/m.     12/19/2022    3:00 PM 12/19/2022    1:41 PM 12/09/2022    3:02 PM 05/31/2020    6:44 AM 07/02/2017    9:36 AM 12/24/2016    4:08 PM 07/31/2016   10:40 AM  Advanced Directives  Does Patient Have a Medical Advance Directive? No No No No No No No  Would patient like information on creating a medical advance directive? No - Patient declined No - Patient declined No - Patient declined No - Patient declined No - Patient declined No - Patient declined No - patient declined information    Current Medications (verified) Outpatient Encounter Medications as of 12/19/2022  Medication Sig   albuterol (VENTOLIN HFA) 108 (90 Base) MCG/ACT inhaler Inhale 2 puffs into the lungs every 6 (six) hours as needed for wheezing or shortness of breath.   fluticasone furoate-vilanterol (BREO ELLIPTA) 100-25 MCG/ACT AEPB Inhale 1 puff into the lungs daily.   ipratropium-albuterol (DUONEB) 0.5-2.5 (3) MG/3ML SOLN Take 3 mLs by nebulization every 4 (four) hours as needed.   RIVAROXABAN (XARELTO) VTE STARTER PACK (15 & 20 MG) *Start on Day 2 of Starter Pack* Take 15 mg by mouth 2 (two) times daily with a meal for 20 days. Then take 20mg  by mouth daily for 9 days. Take with food.   [START ON 01/03/2023] rivaroxaban (XARELTO) 20 MG TABS tablet Take 1 tablet (20 mg total) by mouth daily with supper.   umeclidinium bromide (INCRUSE ELLIPTA) 62.5 MCG/ACT AEPB Inhale 1 puff into the lungs daily.   No facility-administered encounter medications on file as of 12/19/2022.    Allergies (verified) Tdap  [tetanus-diphth-acell pertussis]   History: Past Medical History:  Diagnosis Date   Subarachnoid hemorrhage following injury, with loss of consciousness ([place]) 09/18/2012   syncope and collapse (09/18/2012)   Syncope and collapse 09/18/2012   w/loss of consciousness (09/18/2012)   Past Surgical History:  Procedure Laterality Date   PERCUTANEOUS PINNING PHALANX FRACTURE OF HAND  1980's   "right" (09/18/2012)   Family History  Problem Relation Age of Onset   Hypertension Mother    Colon cancer Father    Social History   Socioeconomic History   Marital status: Married    Spouse name: Not on file   Number of children: Not on file   Years of education: Not on file   Highest education level: Not on file  Occupational History   Not on file  Tobacco Use   Smoking status: Every Day    Packs/day: 0.50    Years: 40.00    Additional pack years: 0.00    Total pack years: 20.00    Types: Cigarettes   Smokeless tobacco: Never   Tobacco comments:    09/18/2012 offered smoking cessation materials; pt declines  Vaping Use   Vaping Use: Never used  Substance and Sexual Activity   Alcohol use: No  Drug use: No   Sexual activity: Not Currently  Other Topics Concern   Not on file  Social History Narrative   Not on file   Social Determinants of Health   Financial Resource Strain: Low Risk  (12/19/2022)   Overall Financial Resource Strain (CARDIA)    Difficulty of Paying Living Expenses: Not hard at all  Food Insecurity: No Food Insecurity (12/19/2022)   Hunger Vital Sign    Worried About Running Out of Food in the Last Year: Never true    Ran Out of Food in the Last Year: Never true  Transportation Needs: No Transportation Needs (12/19/2022)   PRAPARE - Hydrologist (Medical): No    Lack of Transportation (Non-Medical): No  Physical Activity: Inactive (12/19/2022)   Exercise Vital Sign    Days of Exercise per Week: 0 days    Minutes of Exercise per  Session: 0 min  Stress: No Stress Concern Present (12/19/2022)   [place]    Feeling of Stress : Not at all  Social Connections: Moderately Isolated (12/19/2022)   Social Connection and Isolation Panel [NHANES]    Frequency of Communication with Friends and Family: More than three times a week    Frequency of Social Gatherings with Friends and Family: More than three times a week    Attends Religious Services: Never    Marine scientist or Organizations: No    Attends Archivist Meetings: Never    Marital Status: Married    Tobacco Counseling Ready to quit: Not Answered Counseling given: Not Answered Tobacco comments: 09/18/2012 offered smoking cessation materials; pt declines   Clinical Intake:  Pre-visit preparation completed: Yes  Pain : No/denies pain Pain Score: 0-No pain     BMI - recorded: 21.21 Nutritional Status: BMI of 19-24  Normal Nutritional Risks: None Diabetes: No  How often do you need to have someone help you when you read instructions, pamphlets, or other written materials from your doctor or pharmacy?: 1 - Never What is the last grade level you completed in school?: college  Diabetic?NO  Interpreter Needed?: No  Information entered by :: Erik Ward, [place] 12/19/2022   Activities of Daily Living    12/19/2022    3:01 PM 12/19/2022    1:40 PM  In your present state of health, do you have any difficulty performing the following activities:  Hearing? 0 0  Vision? 1 1  Comment NEED GLASSES NEED GLASSES  Difficulty concentrating or making decisions? 0 0  Walking or climbing stairs? 0 0  Dressing or bathing? 0 0  Doing errands, shopping? 0 0  Preparing Food and eating ? N   Using the Toilet? N   In the past six months, have you accidently leaked urine? N   Do you have problems with loss of bowel control? N   Managing your Medications? N   Managing your Finances? N    Housekeeping or managing your Housekeeping? N     Patient Care Team: Antony Blackbird, MD as PCP - General (Family Medicine) Debara Pickett Nadean Corwin, MD as PCP - Cardiology (Cardiology)  Indicate any recent Medical Services you may have received from other than [facility] providers in the past year (date may be approximate).     Assessment:   This is a routine wellness examination for Erik Ward.  Hearing/Vision screen No results found.  Dietary issues and exercise activities discussed:     Goals  Addressed   None   Depression Screen    12/19/2022    3:01 PM 12/19/2022    2:59 PM 12/19/2022    1:41 PM 06/29/2020   10:52 AM 08/11/2019   10:50 AM 07/07/2018    3:46 PM 02/25/2018    3:35 PM  PHQ 2/9 Scores  PHQ - 2 Score 0 0 0 0 0 0 0  PHQ- 9 Score      0     Fall Risk    12/19/2022    3:01 PM 12/19/2022    1:41 PM 06/29/2020   10:52 AM 08/11/2019   10:50 AM 10/29/2013    3:57 PM  [place] in the past year? 0 0 0 0 Yes  Number falls in past yr: 0 0 0 0 1  Injury with Fall? 0 0 0 0 Yes  Comment     Broken foot.  Risk for fall due to : No Fall Risks No Fall Risks     Follow up Falls evaluation completed Falls evaluation completed       [place]:  Any stairs in or around the home? No  If so, are there any without handrails? No  Home free of loose throw rugs in walkways, pet beds, electrical cords, etc? Yes  Adequate lighting in your home to reduce risk of falls? Yes   ASSISTIVE DEVICES UTILIZED TO PREVENT FALLS:  Life alert? No  Use of a cane, walker or w/c? No  Grab bars in the bathroom? Yes  Shower chair or bench in shower? No  Elevated toilet seat or a handicapped toilet? No   TIMED UP AND GO:  Was the test performed? No .  Length of time to ambulate 10 feet: n/a sec.   Gait steady and fast without use of assistive device  Cognitive Function:        12/19/2022    3:02 PM  6CIT Screen  What Year? 0 points  What month? 0  points  What time? 0 points  Count back from 20 0 points  Months in reverse 0 points  Repeat phrase 0 points  Total Score 0 points    Immunizations Immunization History  Administered Date(s) Administered   Influenza,inj,Quad PF,6+ Mos 07/24/2015, 07/31/2016   Pneumococcal Polysaccharide-23 07/31/2016   Tdap 12/24/2016    TDAP status: Up to date  Flu Vaccine status: Due, Education has been provided regarding the importance of this vaccine. Advised may receive this vaccine at local pharmacy or Health Dept. Aware to provide a copy of the vaccination record if obtained from local pharmacy or Health Dept. Verbalized acceptance and understanding.  Pneumococcal vaccine status: Due, Education has been provided regarding the importance of this vaccine. Advised may receive this vaccine at local pharmacy or Health Dept. Aware to provide a copy of the vaccination record if obtained from local pharmacy or Health Dept. Verbalized acceptance and understanding.  Covid-19 vaccine status: Information provided on how to obtain vaccines.   Qualifies for Shingles Vaccine? Yes   Zostavax completed No   Shingrix Completed?: No.    Education has been provided regarding the importance of this vaccine. Patient has been advised to call insurance company to determine out of pocket expense if they have not yet received this vaccine. Advised may also receive vaccine at local pharmacy or Health Dept. Verbalized acceptance and understanding.  Screening Tests Health Maintenance  Topic Date Due   COVID-19 Vaccine (1) Never done  COLONOSCOPY (Pts 45-35yrs Insurance coverage will need to be confirmed)  Never done   Zoster Vaccines- Shingrix (1 of 2) Never done   Pneumonia Vaccine 82+ Years old (2 of 2 - PCV) 07/31/2017   INFLUENZA VACCINE  12/29/2022 (Originally 04/30/2022)   Lung Cancer Screening  12/11/2023   Medicare Annual Wellness (AWV)  12/19/2023   DTaP/Tdap/Td (2 - Td or Tdap) 12/25/2026   Hepatitis C  Screening  Completed   HPV VACCINES  Aged Out    Health Maintenance  Health Maintenance Due  Topic Date Due   COVID-19 Vaccine (1) Never done   COLONOSCOPY (Pts 45-69yrs Insurance coverage will need to be confirmed)  Never done   Zoster Vaccines- Shingrix (1 of 2) Never done   Pneumonia Vaccine 62+ Years old (2 of 2 - PCV) 07/31/2017     Colorectal cancer screening: Defer to PCP.  Lung Cancer Screening: (Low Dose CT Chest recommended if Age 69-80 years, 30 pack-year currently smoking OR have quit w/in 15years.) does not qualify.   Lung Cancer Screening Referral: Defer to PCP.   Additional Screening:  Hepatitis C Screening: does qualify; Completed 07/31/2016  Vision Screening: Recommended annual ophthalmology exams for early detection of glaucoma and other disorders of the eye. Is the patient up to date with their annual eye exam?  No  Who is the provider or what is the name of the office in which the patient attends annual eye exams? Defer to PCP.   If pt is not established with a provider, would they like to be referred to a provider to establish care? Yes .   Dental Screening: Recommended annual dental exams for proper oral hygiene  Community Resource Referral / Chronic Care Management: CRR required this visit?  No   CCM required this visit?  No      Plan:     I have personally reviewed and noted the following in the patient's chart:   Medical and social history Use of alcohol, tobacco or illicit drugs  Current medications and supplements including opioid prescriptions. Patient is not currently taking opioid prescriptions. Functional ability and status Nutritional status Physical activity Advanced directives List of other physicians Hospitalizations, surgeries, and ER visits in previous 12 months Vitals Screenings to include cognitive, depression, and falls Referrals and appointments  In addition, I have reviewed and discussed with patient certain preventive  protocols, quality metrics, and best practice recommendations. A written personalized care plan for preventive services as well as general preventive health recommendations were provided to patient.     Erik Ward, CMA   12/19/2022   Nurse Notes: Face to Face.  Erik Ward , Thank you for taking time to come for your Medicare Wellness Visit. I appreciate your ongoing commitment to your health goals. Please review the following plan we discussed and let me know if I can assist you in the future.   These are the goals we discussed:  Goals   None     This is a list of the screening recommended for you and due dates:  Health Maintenance  Topic Date Due   COVID-19 Vaccine (1) Never done   Colon Cancer Screening  Never done   Zoster (Shingles) Vaccine (1 of 2) Never done   Pneumonia Vaccine (2 of 2 - PCV) 07/31/2017   Flu Shot  12/29/2022*   Screening for Lung Cancer  12/11/2023   Medicare Annual Wellness Visit  12/19/2023   DTaP/Tdap/Td vaccine (2 - Td or Tdap) 12/25/2026  Hepatitis C Screening: USPSTF Recommendation to screen - Ages 49-79 yo.  Completed   HPV Vaccine  Aged Out  *Topic was postponed. The date shown is not the original due date.

## 2022-12-20 ENCOUNTER — Encounter: Payer: Self-pay | Admitting: Student

## 2022-12-20 ENCOUNTER — Telehealth: Payer: Self-pay | Admitting: *Deleted

## 2022-12-20 DIAGNOSIS — E119 Type 2 diabetes mellitus without complications: Secondary | ICD-10-CM | POA: Insufficient documentation

## 2022-12-20 NOTE — Assessment & Plan Note (Addendum)
Patient was recently admitted from 3/11-3/15 for acute hypoxic respiratory failure secondary to possible COPD exacerbation from parainfluenza virus infection and found to have pulmonary embolism in the left main pulmonary artery with right heart strain. TTE showed EF 55-60%, severely dilated LA, RV pressure overload and signs of cor pulmonale. Patient's PE was managed with IV heparin and transition to Xarelto starter pack at discharge. Patient required up to 6 L of oxygen which was titrated down to 3 L by time of discharge. He remains on 3 L but reports that he does not put it on while at home.  Since his hospitalization, patient has not significantly exerted himself. He has been adherent to his Xarelto and denies any signs of bleeding. Patient's PE was thought to be unprovoked so he will likely require an hypercoagulability workup once he is more stable.  On exam, patient with evidence of clubbing and decreased air movement however he has no wheezing or rales. Patient has cool extremities which makes his SpO2 less accurate however we attempted to walk patient with and without oxygen and his SpO2 dropped to 81% on room air while walking. This improved to 97% on 3 L. Patient was advised to wear his oxygen at all times and follow-up with pulmonology next month for PFT to formally diagnose his COPD.  Plan: -Continue Xarelto 15 mg twice daily until April 4th, then transition to 20 mg daily -Continue O2 supplementation and inhalers -Follow-up with pulmonology as scheduled on 01/17/2023

## 2022-12-20 NOTE — Addendum Note (Signed)
Addended byLinwood Dibbles on: 12/20/2022 06:50 PM   Modules accepted: Level of Service

## 2022-12-20 NOTE — Progress Notes (Unsigned)
  Care Coordination  Outreach Note  12/20/2022 Name: Erik Ward. MRN: [id number] DOB: 1956-07-09   Care Coordination Outreach Attempts: An unsuccessful telephone outreach was attempted today to offer the patient information about available care coordination services as a benefit of their health plan.   Follow Up Plan:  Additional outreach attempts will be made to offer the patient care coordination information and services.   Encounter Outcome:  No Answer  [place]  Direct Dial: [phone]

## 2022-12-20 NOTE — Assessment & Plan Note (Signed)
Patient presented to the ED in December 2013 after a syncopal episode while getting out of his car. A CT head showed mild amount of subarachnoid hemorrhage over the convexity of the left and a small subdural hygroma on the left frontal area. A repeat CT head prior to discharge showed resolving on the subarachnoid blood and patient was cleared by neurosurgery.

## 2022-12-20 NOTE — Assessment & Plan Note (Addendum)
Patient reports that his diarrhea has resolved.  His stool studies were unrevealing.

## 2022-12-20 NOTE — Assessment & Plan Note (Signed)
During his recent hospitalization patient was found to have A1c of 6.8.  Patient counseled on lifestyle modifications and advised to decrease intake of foods high in carbs and sugar. -Lifestyle modifications with repeat A1c in 3 months.

## 2022-12-23 NOTE — Progress Notes (Unsigned)
  Care Coordination  Outreach Note  12/23/2022 Name: Erik Ward. MRN: [id number] DOB: 01-Jan-1956   Care Coordination Outreach Attempts: A second unsuccessful outreach was attempted today to offer the patient with information about available care coordination services as a benefit of their health plan.     Follow Up Plan:  Additional outreach attempts will be made to offer the patient care coordination information and services.   Encounter Outcome:  No Answer  [place]  Direct Dial: [phone]

## 2022-12-24 NOTE — Progress Notes (Signed)
  Care Coordination  Outreach Note  12/24/2022 Name: Erik Ward. MRN: [id number] DOB: 04-05-1956   Care Coordination Outreach Attempts: A third unsuccessful outreach was attempted today to offer the patient with information about available care coordination services as a benefit of their health plan.   Follow Up Plan:  No further outreach attempts will be made at this time. We have been unable to contact the patient to offer or enroll patient in care coordination services  Encounter Outcome:  No Answer  [place]: [phone]

## 2022-12-25 NOTE — Addendum Note (Signed)
Addended by: Lalla Brothers T on: 12/25/2022 08:44 AM   Modules accepted: Level of Service

## 2022-12-25 NOTE — Progress Notes (Signed)
Internal Medicine Clinic Attending  Case discussed with Dr. Amponsah  At the time of the visit.  We reviewed the resident's history and exam and pertinent patient test results.  I agree with the assessment, diagnosis, and plan of care documented in the resident's note.  

## 2022-12-25 NOTE — Progress Notes (Signed)
Internal Medicine Clinic Attending  Case and documentation of Dr. Amponsah  reviewed.  I reviewed the AWV findings.  I agree with the assessment, diagnosis, and plan of care documented in the AWV note.     

## 2023-01-09 ENCOUNTER — Other Ambulatory Visit (HOSPITAL_COMMUNITY): Payer: Self-pay

## 2023-01-09 ENCOUNTER — Other Ambulatory Visit: Payer: Self-pay

## 2023-01-13 DIAGNOSIS — J9601 Acute respiratory failure with hypoxia: Secondary | ICD-10-CM | POA: Diagnosis not present

## 2023-01-13 DIAGNOSIS — I2699 Other pulmonary embolism without acute cor pulmonale: Secondary | ICD-10-CM | POA: Diagnosis not present

## 2023-01-13 DIAGNOSIS — R7303 Prediabetes: Secondary | ICD-10-CM | POA: Diagnosis not present

## 2023-01-17 ENCOUNTER — Institutional Professional Consult (permissible substitution): Payer: 59 | Admitting: Pulmonary Disease

## 2023-01-22 ENCOUNTER — Encounter: Payer: Self-pay | Admitting: Pulmonary Disease

## 2023-02-12 DIAGNOSIS — I2699 Other pulmonary embolism without acute cor pulmonale: Secondary | ICD-10-CM | POA: Diagnosis not present

## 2023-02-12 DIAGNOSIS — J9601 Acute respiratory failure with hypoxia: Secondary | ICD-10-CM | POA: Diagnosis not present

## 2023-02-18 ENCOUNTER — Other Ambulatory Visit: Payer: Self-pay

## 2023-02-18 ENCOUNTER — Other Ambulatory Visit (HOSPITAL_COMMUNITY): Payer: Self-pay

## 2023-03-03 ENCOUNTER — Encounter (HOSPITAL_BASED_OUTPATIENT_CLINIC_OR_DEPARTMENT_OTHER): Payer: Self-pay | Admitting: Internal Medicine

## 2023-03-15 DIAGNOSIS — J9601 Acute respiratory failure with hypoxia: Secondary | ICD-10-CM | POA: Diagnosis not present

## 2023-03-15 DIAGNOSIS — I2699 Other pulmonary embolism without acute cor pulmonale: Secondary | ICD-10-CM | POA: Diagnosis not present

## 2023-04-14 DIAGNOSIS — I2699 Other pulmonary embolism without acute cor pulmonale: Secondary | ICD-10-CM | POA: Diagnosis not present

## 2023-04-14 DIAGNOSIS — J9601 Acute respiratory failure with hypoxia: Secondary | ICD-10-CM | POA: Diagnosis not present

## 2023-04-19 ENCOUNTER — Other Ambulatory Visit: Payer: Self-pay | Admitting: Student

## 2023-04-21 ENCOUNTER — Other Ambulatory Visit (HOSPITAL_COMMUNITY): Payer: Self-pay

## 2023-04-21 MED ORDER — FLUTICASONE FUROATE-VILANTEROL 100-25 MCG/ACT IN AEPB
1.0000 | INHALATION_SPRAY | Freq: Every day | RESPIRATORY_TRACT | 0 refills | Status: DC
  Filled 2023-04-21 – 2023-04-24 (×2): qty 60, 30d supply, fill #0

## 2023-04-21 MED ORDER — ALBUTEROL SULFATE HFA 108 (90 BASE) MCG/ACT IN AERS
2.0000 | INHALATION_SPRAY | Freq: Four times a day (QID) | RESPIRATORY_TRACT | 2 refills | Status: AC | PRN
  Filled 2023-04-21 – 2023-04-24 (×2): qty 6.7, 25d supply, fill #0
  Filled 2023-06-07: qty 6.7, 25d supply, fill #1

## 2023-04-22 ENCOUNTER — Encounter (HOSPITAL_BASED_OUTPATIENT_CLINIC_OR_DEPARTMENT_OTHER): Payer: Self-pay | Admitting: Internal Medicine

## 2023-04-24 ENCOUNTER — Other Ambulatory Visit (HOSPITAL_COMMUNITY): Payer: Self-pay

## 2023-04-24 ENCOUNTER — Other Ambulatory Visit: Payer: Self-pay

## 2023-05-15 DIAGNOSIS — I2699 Other pulmonary embolism without acute cor pulmonale: Secondary | ICD-10-CM | POA: Diagnosis not present

## 2023-05-15 DIAGNOSIS — J9601 Acute respiratory failure with hypoxia: Secondary | ICD-10-CM | POA: Diagnosis not present

## 2023-06-12 ENCOUNTER — Other Ambulatory Visit: Payer: Self-pay

## 2023-06-12 ENCOUNTER — Other Ambulatory Visit (HOSPITAL_COMMUNITY): Payer: Self-pay

## 2023-06-13 ENCOUNTER — Other Ambulatory Visit: Payer: Self-pay

## 2023-06-15 DIAGNOSIS — I2699 Other pulmonary embolism without acute cor pulmonale: Secondary | ICD-10-CM | POA: Diagnosis not present

## 2023-06-15 DIAGNOSIS — J9601 Acute respiratory failure with hypoxia: Secondary | ICD-10-CM | POA: Diagnosis not present

## 2023-07-14 NOTE — Telephone Encounter (Signed)
F/u

## 2023-07-15 DIAGNOSIS — J9601 Acute respiratory failure with hypoxia: Secondary | ICD-10-CM | POA: Diagnosis not present

## 2023-07-15 DIAGNOSIS — I2699 Other pulmonary embolism without acute cor pulmonale: Secondary | ICD-10-CM | POA: Diagnosis not present

## 2023-07-29 DIAGNOSIS — Z0289 Encounter for other administrative examinations: Secondary | ICD-10-CM | POA: Diagnosis not present

## 2023-07-29 DIAGNOSIS — J449 Chronic obstructive pulmonary disease, unspecified: Secondary | ICD-10-CM | POA: Diagnosis not present

## 2023-08-14 ENCOUNTER — Encounter: Payer: Self-pay | Admitting: Pulmonary Disease

## 2023-08-14 ENCOUNTER — Ambulatory Visit (INDEPENDENT_AMBULATORY_CARE_PROVIDER_SITE_OTHER): Payer: 59 | Admitting: Pulmonary Disease

## 2023-08-14 VITALS — BP 120/62 | HR 64 | Ht 67.0 in | Wt 130.2 lb

## 2023-08-14 DIAGNOSIS — Z86711 Personal history of pulmonary embolism: Secondary | ICD-10-CM | POA: Diagnosis not present

## 2023-08-14 DIAGNOSIS — J9601 Acute respiratory failure with hypoxia: Secondary | ICD-10-CM | POA: Diagnosis not present

## 2023-08-14 DIAGNOSIS — F17200 Nicotine dependence, unspecified, uncomplicated: Secondary | ICD-10-CM

## 2023-08-14 MED ORDER — VARENICLINE TARTRATE (STARTER) 0.5 MG X 11 & 1 MG X 42 PO TBPK: 1.0000 | ORAL_TABLET | Freq: Every day | ORAL | 0 refills | Status: DC

## 2023-08-14 MED ORDER — NICOTINE 21 MG/24HR TD PT24: 21.0000 mg | MEDICATED_PATCH | Freq: Every day | TRANSDERMAL | 0 refills | Status: DC

## 2023-08-14 MED ORDER — BENZONATATE 200 MG PO CAPS: 200.0000 mg | ORAL_CAPSULE | Freq: Three times a day (TID) | ORAL | 1 refills | Status: DC | PRN

## 2023-08-14 NOTE — Progress Notes (Signed)
Erik Ward    [id number]    Nov 24, 1955  Primary Care Physician:Fulp, Hewitt Shorts, MD  Referring Physician: Cain Saupe, MD [address] [place],  [place] [address]  Chief complaint: Consult for COPD  HPI: 67 y.o. who  has a past medical history of Subarachnoid hemorrhage following injury, with loss of consciousness (HCC) (09/18/2012) and Syncope and collapse (09/18/2012).   Discussed the use of AI scribe software for clinical note transcription with the patient, who gave verbal consent to proceed.  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 He also has a history of emphysema and was diagnosed with COPD during the hospitalization. The PE was unprovoked, with no known immobility, recent surgery, or prolonged travel. He also has a history of a subarachnoid hemorrhage following a fall in 2013.  He was prescribed Eliquis for the PE but stopped taking it after one month due to discomfort with the idea of blood thinning and concerns about a potential brain bleed. He understands the risk of clot reformation and potential life-threatening consequences but has decided against restarting the medication.  He has been a smoker for approximately 50 years, currently smoking half a pack a day, down from a pack a day in the past. He is considering quitting and is open to trying nicotine patches or Chantix.  He is currently on Albuterol and another unknown aerosol inhaler prescribed by his new primary care physician. He is also on home oxygen at a setting of 3 liters per minute since his hospitalization in March.   Pets: Dogs Occupation: Retired Engineer, civil (consulting) Exposures: No mold, hot tub, Jacuzzi.  No feather pillows or comforters Smoking history: 50-pack-year smoker.  Continues to smoke half pack per day Travel  history: Previously lived in [place].  No significant recent Relevant family history: No significant family history of lung disease   Outpatient Encounter Medications as of 08/14/2023  Medication Sig   albuterol (VENTOLIN HFA) 108 (90 Base) MCG/ACT inhaler Inhale 2 puffs into the lungs every 6 (six) hours as needed for wheezing or shortness of breath.   fluticasone furoate-vilanterol (BREO ELLIPTA) 100-25 MCG/ACT AEPB Inhale 1 puff into the lungs daily.   ipratropium-albuterol (DUONEB) 0.5-2.5 (3) MG/3ML SOLN Take 3 mLs by nebulization every 4 (four) hours as needed.   nicotine (NICODERM CQ - DOSED IN MG/24 HOURS) 21 mg/24hr patch Place 1 patch (21 mg total) onto the skin daily.   Varenicline Tartrate, Starter, (CHANTIX STARTING MONTH PAK) 0.5 MG X 11 & 1 MG X 42 TBPK Take 1 tablet by mouth daily.   [DISCONTINUED] rivaroxaban (XARELTO) 20 MG TABS tablet Take 1 tablet (20 mg total) by mouth daily with supper.   [DISCONTINUED] RIVAROXABAN (XARELTO) VTE STARTER PACK (15 & 20 MG) *Start on Day 2 of Starter Pack* Take 15 mg by mouth 2 (two) times daily with a meal for 20 days. Then take 20mg  by mouth daily for 9 days. Take with food.   No facility-administered encounter medications on file as of 08/14/2023.    Allergies as of 08/14/2023 - Review Complete 08/14/2023  Allergen Reaction Noted   Tdap [tetanus-diphth-acell pertussis]  12/12/2017    Past Medical History:  Diagnosis Date   Subarachnoid hemorrhage following injury, with loss of consciousness (HCC) 09/18/2012   syncope and collapse (09/18/2012)   Syncope and collapse 09/18/2012  w/loss of consciousness (09/18/2012)    Past Surgical History:  Procedure Laterality Date   PERCUTANEOUS PINNING PHALANX FRACTURE OF HAND  1980's   "right" (09/18/2012)    Family History  Problem Relation Age of Onset   Hypertension Mother    Colon cancer Father     Social History   Socioeconomic History   Marital status: Married     Spouse name: Not on file   Number of children: Not on file   Years of education: Not on file   Highest education level: Not on file  Occupational History   Not on file  Tobacco Use   Smoking status: Every Day    Current packs/day: 0.50    Average packs/day: 0.5 packs/day for 40.0 years (20.0 ttl pk-yrs)    Types: Cigarettes   Smokeless tobacco: Never   Tobacco comments:    09/18/2012 offered smoking cessation materials; pt declines  Vaping Use   Vaping status: Never Used  Substance and Sexual Activity   Alcohol use: No   Drug use: No   Sexual activity: Not Currently  Other Topics Concern   Not on file  Social History Narrative   Not on file   Social Determinants of Health   Financial Resource Strain: Low Risk  (12/19/2022)   Overall Financial Resource Strain (CARDIA)    Difficulty of Paying Living Expenses: Not hard at all  Food Insecurity: No Food Insecurity (12/19/2022)   Hunger Vital Sign    Worried About Running Out of Food in the Last Year: Never true    Ran Out of Food in the Last Year: Never true  Transportation Needs: No Transportation Needs (12/19/2022)   PRAPARE - Administrator, Civil Service (Medical): No    Lack of Transportation (Non-Medical): No  Physical Activity: Inactive (12/19/2022)   Exercise Vital Sign    Days of Exercise per Week: 0 days    Minutes of Exercise per Session: 0 min  Stress: No Stress Concern Present (12/19/2022)   [employer] of Occupational Health - Occupational Stress Questionnaire    Feeling of Stress : Not at all  Social Connections: Moderately Isolated (12/19/2022)   Social Connection and Isolation Panel [NHANES]    Frequency of Communication with Friends and Family: More than three times a week    Frequency of Social Gatherings with Friends and Family: More than three times a week    Attends Religious Services: Never    Database administrator or Organizations: No    Attends Banker Meetings: Never     Marital Status: Married  Catering manager Violence: Not At Risk (12/19/2022)   Humiliation, Afraid, Rape, and Kick questionnaire    Fear of Current or Ex-Partner: No    Emotionally Abused: No    Physically Abused: No    Sexually Abused: No    Review of systems: Review of Systems  Constitutional: Negative for fever and chills.  HENT: Negative.   Eyes: Negative for blurred vision.  Respiratory: as per HPI  Cardiovascular: Negative for chest pain and palpitations.  Gastrointestinal: Negative for vomiting, diarrhea, blood per rectum. Genitourinary: Negative for dysuria, urgency, frequency and hematuria.  Musculoskeletal: Negative for myalgias, back pain and joint pain.  Skin: Negative for itching and rash.  Neurological: Negative for dizziness, tremors, focal weakness, seizures and loss of consciousness.  Endo/Heme/Allergies: Negative for environmental allergies.  Psychiatric/Behavioral: Negative for depression, suicidal ideas and hallucinations.  All other systems reviewed and are negative.  Physical Exam:  Blood pressure 120/62, pulse 64, height 5\' 7"  (1.702 m), weight 130 lb 3.2 oz (59.1 kg), SpO2 94%. Gen:      No acute distress HEENT:  EOMI, sclera anicteric Neck:     No masses; no thyromegaly Lungs:    Clear to auscultation bilaterally; normal respiratory effort CV:         Regular rate and rhythm; no murmurs Abd:      + bowel sounds; soft, non-tender; no palpable masses, no distension Ext:    No edema; adequate peripheral perfusion Skin:      Warm and dry; no rash Neuro: alert and oriented x 3 Psych: normal mood and affect  Data Reviewed: Imaging: CTA 12/11/2022-left main pulmonary embolism, RV/LV ratio 1.6, bilateral airspace opacities, severe emphysema. I have reviewed the images personally.  PFTs:  Labs:  Cardiac: Echocardiogram 12/10/2022-LVEF 55-60%, severe RV enlargement suggestive cor pulmonale  Assessment and Plan Pulmonary Embolism (PE) with cor  pulmonale History of unprovoked PE in March 2024. Patient stopped anticoagulation after 1 month against medical advice due to fear of bleeding. Discussed extensively the risk of recurrent PE and potential life-threatening consequences. Patient understands but declines to restart anticoagulation. -No change in management per patient's preference.  Chronic Obstructive Pulmonary Disease (COPD) History of smoking with symptoms of shortness of breath. Currently on Albuterol. Unknown inhaler prescribed by PCP. -Continue Albuterol and unspecified inhaler -Plan to perform lung function test in the next 3 months. -Request patient to bring all inhalers to next visit for review.  Tobacco Use Disorder 50-year history of smoking, currently half a pack per day. Expressed interest in quitting.  Time spent counseling- 3 mins. Reassess at return visit -Prescribe nicotine patches and Chantix to aid in smoking cessation.  Hypoxia SATURATION QUALIFICATIONS: (This note is used to comply with regulatory documentation for home oxygen) Patient Saturations on Room Air at Rest = 92% Patient Saturations on Room Air while Ambulating = 86% Patient Saturations on 4L Liters of oxygen POC while Ambulating = 95% Please briefly explain why patient needs home oxygen: life support , COPD  General Health Maintenance -Order annual CT scan for lung cancer screening in March 2025.  Recommendations: Lung function test Smoking cessation with nicotine patch, Chantix Qualify for supplemental oxygen with portable concentrator  Chilton Greathouse MD [facility] 08/14/2023, 11:32 AM  CC: Cain Saupe, MD

## 2023-08-14 NOTE — Patient Instructions (Signed)
VISIT SUMMARY:  Today, we discussed your recent health concerns, including your history of pulmonary embolism (PE), chronic obstructive pulmonary disease (COPD), and tobacco use. We reviewed your current medications and made plans for further testing and smoking cessation support.  YOUR PLAN:  -PULMONARY EMBOLISM (PE): A pulmonary embolism is a blockage in one of the pulmonary arteries in your lungs, often caused by blood clots that travel to the lungs from the legs. You have decided not to restart anticoagulation medication despite understanding the risks of recurrent PE. No changes to your current management were made.  -CHRONIC OBSTRUCTIVE PULMONARY DISEASE (COPD): COPD is a chronic inflammatory lung disease that obstructs airflow from the lungs, often caused by long-term exposure to irritating gases or particulate matter, most commonly from cigarette smoke. You will continue using Albuterol, and we plan to perform a lung function test in the next 3 months. Please bring all your inhalers to the next visit for review.  -TOBACCO USE DISORDER: Tobacco use disorder is a dependence on tobacco products, which can lead to serious health issues. You have expressed interest in quitting smoking. We will prescribe nicotine patches and Chantix to help you with smoking cessation.  -GENERAL HEALTH MAINTENANCE: We will order an annual CT scan for lung cancer screening in March 2025 and evaluate you for portable oxygen qualification with a walk test.  INSTRUCTIONS:  Please bring all your inhalers to the next visit for review. We will perform a lung function test in the next 3 months. Your annual CT scan for lung cancer screening will be scheduled for March 2025. Additionally, we will evaluate you for portable oxygen qualification with a walk test.

## 2023-09-25 ENCOUNTER — Ambulatory Visit (INDEPENDENT_AMBULATORY_CARE_PROVIDER_SITE_OTHER): Payer: 59 | Admitting: Primary Care

## 2023-10-17 ENCOUNTER — Encounter: Payer: Self-pay | Admitting: Pulmonary Disease

## 2023-11-07 NOTE — Telephone Encounter (Signed)
 Order was sent to [facility] back on 08/14/2023 not sure how they handle their contact

## 2023-11-20 ENCOUNTER — Telehealth: Payer: Self-pay | Admitting: Pulmonary Disease

## 2023-11-20 ENCOUNTER — Ambulatory Visit: Payer: 59 | Admitting: Pulmonary Disease

## 2023-11-20 DIAGNOSIS — J9601 Acute respiratory failure with hypoxia: Secondary | ICD-10-CM

## 2023-11-20 NOTE — Telephone Encounter (Signed)
 Patient states he was approved for portable oxygen. He is wondering when he will receive it. Please call and advise [phone]

## 2023-11-24 NOTE — Telephone Encounter (Signed)
 Spoke with [facility] they stated they need a different CPT code for a POC, [id number]. Stated need new order with [id number] she said last order was not correct CPT for POC. For more information [phone]

## 2023-11-26 NOTE — Telephone Encounter (Signed)
 We do not typically provide CPT codes when placing DME orders. Not sure why they are asking for CPT code.   Routing to [facility] for assistance.

## 2023-12-03 NOTE — Telephone Encounter (Signed)
 Not sure why [employer] is asking [place] for CPT code. Can someone assist? PCCs are not able to. Thanks!

## 2023-12-03 NOTE — Telephone Encounter (Signed)
 I have placed new referral with CPT code for POC which is [phone].  Routing back to PCC's just as FYI so that the order can be re sent if needed Thanks!

## 2023-12-03 NOTE — Telephone Encounter (Signed)
 This information was provided by [facility] to triage 9 days ago - please update order or contact [facility] about this order.  Thank you.

## 2023-12-05 NOTE — Telephone Encounter (Signed)
 Dme order has been sent to Erik Ward with [facility]. The Updated referral has been sent with the CPT code for POC.  NFN.

## 2023-12-05 NOTE — Telephone Encounter (Signed)
 Order was sent to [facility] by Ephriam Knuckles on 12/03/23

## 2024-02-03 ENCOUNTER — Ambulatory Visit: Payer: 59 | Admitting: Pulmonary Disease

## 2024-02-03 ENCOUNTER — Encounter: Payer: Self-pay | Admitting: Pulmonary Disease

## 2024-07-02 ENCOUNTER — Ambulatory Visit: Admitting: *Deleted

## 2024-07-02 ENCOUNTER — Telehealth: Payer: Self-pay | Admitting: *Deleted

## 2024-07-02 DIAGNOSIS — F17200 Nicotine dependence, unspecified, uncomplicated: Secondary | ICD-10-CM | POA: Diagnosis not present

## 2024-07-02 DIAGNOSIS — J9601 Acute respiratory failure with hypoxia: Secondary | ICD-10-CM

## 2024-07-02 LAB — PULMONARY FUNCTION TEST
DL/VA % pred: 43 %
DL/VA: 1.78 ml/min/mmHg/L
DLCO cor % pred: 25 %
DLCO cor: 6.13 ml/min/mmHg
DLCO unc % pred: 25 %
DLCO unc: 6.13 ml/min/mmHg
FEF 25-75 Post: 0.33 L/s
FEF 25-75 Pre: 0.26 L/s
FEF2575-%Change-Post: 28 %
FEF2575-%Pred-Post: 14 %
FEF2575-%Pred-Pre: 11 %
FEV1-%Change-Post: 17 %
FEV1-%Pred-Post: 24 %
FEV1-%Pred-Pre: 20 %
FEV1-Post: 0.7 L
FEV1-Pre: 0.6 L
FEV1FVC-%Change-Post: 0 %
FEV1FVC-%Pred-Pre: 53 %
FEV6-%Change-Post: 13 %
FEV6-%Pred-Post: 45 %
FEV6-%Pred-Pre: 39 %
FEV6-Post: 1.69 L
FEV6-Pre: 1.49 L
FEV6FVC-%Change-Post: 0 %
FEV6FVC-%Pred-Post: 102 %
FEV6FVC-%Pred-Pre: 103 %
FVC-%Change-Post: 17 %
FVC-%Pred-Post: 45 %
FVC-%Pred-Pre: 38 %
FVC-Post: 1.79 L
FVC-Pre: 1.52 L
Post FEV1/FVC ratio: 39 %
Post FEV6/FVC ratio: 97 %
Pre FEV1/FVC ratio: 39 %
Pre FEV6/FVC Ratio: 98 %
RV % pred: 306 %
RV: 6.93 L
TLC % pred: 138 %
TLC: 8.88 L

## 2024-07-02 NOTE — Telephone Encounter (Signed)
 Pt in office today for PFT inquiiring about POC. It appears order was sent in March but patient has not received POC. Please assist and contact patient [phone].

## 2024-07-02 NOTE — Patient Instructions (Signed)
 Full PFT performed today.

## 2024-07-02 NOTE — Progress Notes (Signed)
 Full PFT performed today.

## 2024-07-05 ENCOUNTER — Ambulatory Visit (INDEPENDENT_AMBULATORY_CARE_PROVIDER_SITE_OTHER): Admitting: Pulmonary Disease

## 2024-07-05 ENCOUNTER — Encounter: Payer: Self-pay | Admitting: Pulmonary Disease

## 2024-07-05 VITALS — BP 108/68 | HR 70 | Temp 98.1°F | Ht 67.0 in | Wt 119.0 lb

## 2024-07-05 DIAGNOSIS — I2609 Other pulmonary embolism with acute cor pulmonale: Secondary | ICD-10-CM

## 2024-07-05 DIAGNOSIS — J9601 Acute respiratory failure with hypoxia: Secondary | ICD-10-CM

## 2024-07-05 DIAGNOSIS — J4489 Other specified chronic obstructive pulmonary disease: Secondary | ICD-10-CM

## 2024-07-05 DIAGNOSIS — F17200 Nicotine dependence, unspecified, uncomplicated: Secondary | ICD-10-CM

## 2024-07-05 DIAGNOSIS — F17210 Nicotine dependence, cigarettes, uncomplicated: Secondary | ICD-10-CM | POA: Diagnosis not present

## 2024-07-05 DIAGNOSIS — J439 Emphysema, unspecified: Secondary | ICD-10-CM

## 2024-07-05 LAB — CBC WITH DIFFERENTIAL/PLATELET
Basophils Absolute: 0 K/uL (ref 0.0–0.1)
Basophils Relative: 1.5 % (ref 0.0–3.0)
Eosinophils Absolute: 0.1 K/uL (ref 0.0–0.7)
Eosinophils Relative: 2.5 % (ref 0.0–5.0)
HCT: 45.7 % (ref 39.0–52.0)
Hemoglobin: 14.9 g/dL (ref 13.0–17.0)
Lymphocytes Relative: 33.2 % (ref 12.0–46.0)
Lymphs Abs: 1 K/uL (ref 0.7–4.0)
MCHC: 32.7 g/dL (ref 30.0–36.0)
MCV: 93.5 fl (ref 78.0–100.0)
Monocytes Absolute: 0.4 K/uL (ref 0.1–1.0)
Monocytes Relative: 13.8 % — ABNORMAL HIGH (ref 3.0–12.0)
Neutro Abs: 1.6 K/uL (ref 1.4–7.7)
Neutrophils Relative %: 49 % (ref 43.0–77.0)
Platelets: 220 K/uL (ref 150.0–400.0)
RBC: 4.89 Mil/uL (ref 4.22–5.81)
RDW: 13.8 % (ref 11.5–15.5)
WBC: 3.2 K/uL — ABNORMAL LOW (ref 4.0–10.5)

## 2024-07-05 MED ORDER — NICOTINE 21 MG/24HR TD PT24: 21.0000 mg | MEDICATED_PATCH | Freq: Every day | TRANSDERMAL | 0 refills | Status: AC

## 2024-07-05 MED ORDER — VARENICLINE TARTRATE (STARTER) 0.5 MG X 11 & 1 MG X 42 PO TBPK: 1.0000 | ORAL_TABLET | Freq: Every day | ORAL | 0 refills | Status: AC

## 2024-07-05 MED ORDER — TRELEGY ELLIPTA 200-62.5-25 MCG/ACT IN AEPB: 1.0000 | INHALATION_SPRAY | Freq: Every day | RESPIRATORY_TRACT | 11 refills | Status: AC

## 2024-07-05 NOTE — Progress Notes (Addendum)
 Erik Ward    [id number]    [birth year]  Primary Care Physician:Fulp, Lannie, MD  Referring Physician: Alec Lannie, MD [address], [address] [place],  [place] [address]  Chief complaint: Follow-up for COPD  HPI: 68 y.o. who  has a past medical history of Subarachnoid hemorrhage following injury, with loss of consciousness (HCC) (09/18/2012) and Syncope and collapse (09/18/2012).   Discussed the use of AI scribe software for clinical note transcription with the patient, who gave verbal consent to proceed.  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 He also has a history of emphysema and was diagnosed with COPD during the hospitalization. The PE was unprovoked, with no known immobility, recent surgery, or prolonged travel. He also has a history of a subarachnoid hemorrhage following a fall in 2013.  He was prescribed Eliquis for the PE but stopped taking it after one month due to discomfort with the idea of blood thinning and concerns about a potential brain bleed. He understands the risk of clot reformation and potential life-threatening consequences but has decided against restarting the medication.  He has been a smoker for approximately 50 years, currently smoking half a pack a day, down from a pack a day in the past. He is considering quitting and is open to trying nicotine  patches or Chantix .  He is currently on Albuterol  and another unknown aerosol inhaler prescribed by his new primary care physician. He is also on home oxygen  at a setting of 3 liters per minute since his hospitalization in March.   Interim history: Discussed the use of AI scribe software for clinical note transcription with the patient, who gave verbal consent to proceed.  History of Present Illness Erik Ward. is a 68 year  old male with severe COPD who presents for follow-up on his respiratory condition.  Dyspnea and chronic obstructive pulmonary disease (copd) - No significant change in breathing since last visit - Lung capacity approximately 20-25% - Uses Spiriva inhalers regularly - Uses albuterol  inhaler as needed - Lacks a nebulizer despite having DuoNeb medication for it - On continuous oxygen  therapy at 3 liters per minute  Tobacco use and smoking cessation - Continues to smoke - Has attempted to quit using nicotine  pouches - Considering Chantix  or nicotine  patches for smoking cessation  History of pulmonary embolism and anticoagulation - Pulmonary embolism in March 2024 - Discontinued Eliquis due to bleeding concerns  Nasal congestion - Nasal congestion, particularly at night - Not using over-the-counter medications for symptom relief    Relevant pulmonary history Pets: Dogs Occupation: Retired Engineer, civil (consulting) Exposures: No mold, hot tub, Financial controller.  No feather pillows or comforters Smoking history: 50-pack-year smoker.  Continues to smoke half pack per day Travel history: Previously lived in [place] .  No significant recent Relevant family history: No significant family history of lung disease   Outpatient Encounter Medications as of 07/05/2024  Medication Sig   albuterol  (VENTOLIN  HFA) 108 (90 Base) MCG/ACT inhaler Inhale 2 puffs into the lungs every 6 (six) hours as needed for wheezing or shortness of breath.   benzonatate  (TESSALON ) 200 MG capsule Take 1 capsule (200 mg total) by mouth 3 (three) times daily as needed for cough.   fluticasone  furoate-vilanterol (BREO ELLIPTA ) 100-25 MCG/ACT AEPB Inhale 1 puff into the lungs daily.   SPIRIVA HANDIHALER 18 MCG  inhalation capsule daily.   ipratropium-albuterol  (DUONEB) 0.5-2.5 (3) MG/3ML SOLN Take 3 mLs by nebulization every 4 (four) hours as needed. (Patient not taking: Reported on 07/05/2024)   nicotine  (NICODERM CQ  - DOSED IN  MG/24 HOURS) 21 mg/24hr patch Place 1 patch (21 mg total) onto the skin daily. (Patient not taking: Reported on 07/05/2024)   Varenicline  Tartrate, Starter, (CHANTIX  STARTING MONTH PAK) 0.5 MG X 11 & 1 MG X 42 TBPK Take 1 tablet by mouth daily. (Patient not taking: Reported on 07/05/2024)   No facility-administered encounter medications on file as of 07/05/2024.    Vitals:   07/05/24 1334  BP: 108/68  Pulse: 70  Temp: 98.1 F (36.7 C)  Height: 5' 7 (1.702 m)  Weight: 119 lb (54 kg)  SpO2: 97%  TempSrc: Oral  BMI (Calculated): 18.63     Physical Exam GEN: No acute distress CV: Regular rate and rhythm no murmurs LUNGS: Clear to auscultation bilaterally normal respiratory effort SKIN JOINTS: Warm and dry no rash    Data Reviewed: Imaging: CTA 12/11/2022-left main pulmonary embolism, RV/LV ratio 1.6, bilateral airspace opacities, severe emphysema. I have reviewed the images personally.  PFTs: 07/02/2024 FVC 1.79 [45%], FEV1 0.70 [24%], F/F39, TLC 8.88 [138%], DLCO 6.13 [25%] Severe obstruction, diffusion defect Hyperinflation and air trapping with bronchodilator response  Labs:  Cardiac: Echocardiogram 12/10/2022-LVEF 55-60%, severe RV enlargement suggestive cor pulmonale Assessment & Plan Very severe chronic obstructive pulmonary disease (COPD) Very severe COPD with lung capacity around 20-25%. Current inhaler regimen is suboptimal. Lung damage is permanent, but cessation of smoking can prevent further damage and improve exercise capacity. He is on 3 liters of supplemental oxygen  continuously, including at night, consistent with his very severe COPD and low lung capacity. - Prescribe Trelegy inhaler once daily. - Continue supplemental oxygen  4 L/min - Order nebulizer machine for use with Duoneb as needed. - Discussed pulmonary rehabilitation program but patient is not ready yet - Refer for annual lung cancer screening CT. - Check CBC differential, IgE, alpha-1 antitrypsin  levels and phenotype  Nicotine  dependence (tobacco use disorder) Continues to smoke despite very severe COPD. Smoking cessation is critical to prevent further lung damage and improve overall health. Previous prescription for Chantix  and nicotine  patches was not filled. - Prescribe Chantix  and nicotine  patches. - Instruct to start Chantix  and patches one week before chosen quit date.  Nasal congestion (suspected sinus issue) Intermittent nasal congestion, particularly when lying down. Suspected sinus issues. - Recommend over-the-counter decongestants such as Zyrtec  or Claritin .  Pulmonary Embolism (PE) with cor pulmonale History of unprovoked PE in March 2024. Patient stopped anticoagulation after 1 month against medical advice due to fear of bleeding. Discussed extensively the risk of recurrent PE and potential life-threatening consequences. Patient understands but declines to restart anticoagulation. -No change in management per patient's preference.  SATURATION QUALIFICATIONS: (This note is used to comply with regulatory documentation for home oxygen )  Patient Saturations on Room Air at Rest = 95%  Patient Saturations on Room Air while Ambulating = 85%  Patient Saturations on 4 Liters POC of oxygen  while Ambulating = 93%  Please briefly explain why patient needs home oxygen : Patient walked slow pace. Oxygen  level dropped in room air, patient was placed on 4L POC and was able to keep saturation levels above 88-90%.     Recommendations: Discontinue Spiriva.  Start Trelegy Smoking cessation with nicotine  patch, Chantix  DuoNebs Annual screening CT Check CBC differential, IgE, alpha-1 antitrypsin levels and phenotype  I personally spent  a total of 50 minutes in the care of the patient today including preparing to see the patient, getting/reviewing separately obtained history, performing a medically appropriate exam/evaluation, placing orders, independently interpreting results, and  communicating results.   Lonna Coder MD  Pulmonary and Critical Care 07/05/2024, 1:45 PM  CC: Alec House, MD

## 2024-07-05 NOTE — Addendum Note (Signed)
 Addended by: MOODY RANCHER on: 07/05/2024 04:23 PM   Modules accepted: Orders

## 2024-07-05 NOTE — Telephone Encounter (Signed)
 Seems that this patient is a [facility] patient. I will attempt to get ahold of [facility].

## 2024-07-05 NOTE — Patient Instructions (Addendum)
  VISIT SUMMARY: Today, we reviewed your severe COPD, smoking cessation efforts, nasal congestion, and history of pulmonary embolism. We made some changes to your treatment plan to help manage your symptoms and improve your overall health.  YOUR PLAN: VERY SEVERE CHRONIC OBSTRUCTIVE PULMONARY DISEASE (COPD): Your COPD is very severe with lung capacity around 20-25%. -Start using the Trelegy inhaler once daily. -A nebulizer machine will be ordered for you to use with Duoneb as needed. -You will be referred for annual lung cancer screening CT   NICOTINE  DEPENDENCE (TOBACCO USE DISORDER): You continue to smoke despite your severe COPD. Quitting smoking is crucial for your health. -You will be prescribed Chantix  and nicotine  patches. -Start using Chantix  and patches one week before your chosen quit date.  NASAL CONGESTION (SUSPECTED SINUS ISSUE): You have intermittent nasal congestion, especially at night. -You can use over-the-counter decongestants such as Zyrtec  or Claritin .  PERSONAL HISTORY OF PULMONARY EMBOLISM: You had a pulmonary embolism in March 2024 and are no longer on Eliquis due to bleeding concerns. -We acknowledge your decision to remain off anticoagulation.                              Contains text generated by [employer].

## 2024-07-05 NOTE — Addendum Note (Signed)
 Addended byBETHA THEOPHILUS ROOSEVELT on: 07/05/2024 02:26 PM   Modules accepted: Level of Service

## 2024-07-06 ENCOUNTER — Telehealth: Payer: Self-pay | Admitting: Pulmonary Disease

## 2024-07-06 NOTE — Telephone Encounter (Signed)
 Per Avelina with [employer]- DX code is not valid per Medicare guidelines, Please have provider update and resend. Thank you

## 2024-07-06 NOTE — Telephone Encounter (Signed)
 Just the NEB

## 2024-07-06 NOTE — Telephone Encounter (Signed)
 Does the dx code need to be changed for both the neb and oxygen  or just the neb?

## 2024-07-07 NOTE — Telephone Encounter (Signed)
 Per Avelina with [employer]- Sats need to be co-signed or copied and paste into providers note please.  Thank you  Please advise

## 2024-07-08 NOTE — Telephone Encounter (Signed)
 Neb order has been updated.   Dr. Theophilus, please add walk test to your last note. Thanks

## 2024-07-08 NOTE — Addendum Note (Signed)
 Addended by: CLAUDENE NEVINS A on: 07/08/2024 12:21 PM   Modules accepted: Orders

## 2024-07-10 LAB — ALPHA-1 ANTITRYPSIN PHENOTYPE: A-1 Antitrypsin, Ser: 169 mg/dL (ref 83–199)

## 2024-07-10 LAB — IGE: IgE (Immunoglobulin E), Serum: 191 kU/L — ABNORMAL HIGH (ref <=114)

## 2024-07-13 NOTE — Telephone Encounter (Signed)
 Ok done

## 2024-07-13 NOTE — Telephone Encounter (Signed)
 noted

## 2024-07-15 ENCOUNTER — Telehealth: Payer: Self-pay | Admitting: Pulmonary Disease

## 2024-07-15 NOTE — Telephone Encounter (Signed)
 Per Patrica at [employer]- DX code is not valid per Medicare guidelines, Please update and resend. Thank you

## 2024-07-16 NOTE — Telephone Encounter (Signed)
Which order is this regarding?

## 2024-07-16 NOTE — Telephone Encounter (Signed)
 The O2 order

## 2024-07-19 NOTE — Addendum Note (Signed)
 Addended by: CLAUDENE NEVINS A on: 07/19/2024 02:18 PM   Modules accepted: Orders

## 2024-07-19 NOTE — Telephone Encounter (Signed)
 Order has been resubmitted with updated dx.

## 2024-07-20 ENCOUNTER — Ambulatory Visit: Payer: Self-pay | Admitting: Pulmonary Disease

## 2024-09-28 ENCOUNTER — Other Ambulatory Visit: Payer: Self-pay | Admitting: Pulmonary Disease

## 2024-09-28 NOTE — Telephone Encounter (Signed)
 Pt requesting refill of benzonatate , not mentioned in LOV notes to continue or d/c. Please advise, thank you!

## 2024-10-11 NOTE — Telephone Encounter (Signed)
 Copied from CRM #[id number]. Topic: Clinical - Order For Equipment >> Oct 11, 2024  8:24 AM Russell PARAS wrote: Reason for CRM:   Jackqulyn, from pt's PCP office, is contacting clinic regarding his request for POC. He came into PCP office last week and had an oxygen  level of 84%, and was requesting if order for POC could be placed.   PCP office would like to know if Mannam can place order for POC on pt's behalf. Requested call back   CB#  [phone], no direct line to clinic staff, will need to ask for med assitant

## 2024-10-14 ENCOUNTER — Telehealth: Payer: Self-pay | Admitting: *Deleted

## 2024-10-14 NOTE — Telephone Encounter (Signed)
 Copied from CRM #[id number]. Topic: Clinical - Order For Equipment >> Oct 11, 2024  8:24 AM Russell PARAS wrote: Reason for CRM:   Jackqulyn, from pt's PCP office, is contacting clinic regarding his request for POC. He came into PCP office last week and had an oxygen  level of 84%, and was requesting if order for POC could be placed.   PCP office would like to know if Mann[facility] can place order for POC on pt's behalf. Requested call back   CB#  [phone], no direct line to clinic staff, will need to ask for med assitant >> Oct 14, 2024  9:38 AM Rozanna MATSU wrote: Jackqulyn with [facility] calling back about the POC and the status. Advised her the referral was sent to the DME   --------------------------------------------------------------------------------------------------------------------------------------------  LOV was 07/05/24, POC was ordered at that time:  Type Date User Summary Attachment  Provider Comments 07/05/2024  4:23 PM Moody Rancher, CMA Provider Comments -  Note: Route (nasal cannula OR mask): Cannula Liter Flow: 4L Frequency (continuous with stationary and portable oxygen  unit needed OR only at night): (PLEASE CHOOSE ONE) Continuous with stationary and portable oxygen  unit  POC @ setting of 4L pulsed dose Length of Need: Lifetime Was this based off an ONO?: NO DME: [facility]  Conserving Device (Yes or No): yes Type of Oxygen : Gas If new O2 start, please evaluate and titrate for best fit POC or portable O2 system. RT to evaluate and titrate patient for POC or homefill with OCD maintaing sats >/=90%. If patient qualifies, dispense POC or homefill with OCD 1-5 pulse dose.   PCCs:  Please contact [facility] to see what happened to this patients POC.  Thank you.

## 2024-10-31 NOTE — Telephone Encounter (Signed)
 Note on referral from Dolanda ([employer]) states is being worked on
